# Patient Record
Sex: Male | Born: 1950
Health system: Southern US, Community
[De-identification: ages and names within clinical notes are randomized; demographics above are authoritative.]

## PROBLEM LIST (undated history)

## (undated) DIAGNOSIS — M1990 Unspecified osteoarthritis, unspecified site: Secondary | ICD-10-CM

## (undated) DIAGNOSIS — K219 Gastro-esophageal reflux disease without esophagitis: Secondary | ICD-10-CM

## (undated) DIAGNOSIS — I519 Heart disease, unspecified: Secondary | ICD-10-CM

## (undated) DIAGNOSIS — Z8673 Personal history of transient ischemic attack (TIA), and cerebral infarction without residual deficits: Secondary | ICD-10-CM

## (undated) DIAGNOSIS — K589 Irritable bowel syndrome without diarrhea: Secondary | ICD-10-CM

## (undated) DIAGNOSIS — E669 Obesity, unspecified: Secondary | ICD-10-CM

## (undated) DIAGNOSIS — I379 Nonrheumatic pulmonary valve disorder, unspecified: Secondary | ICD-10-CM

## (undated) DIAGNOSIS — I10 Essential (primary) hypertension: Secondary | ICD-10-CM

## (undated) DIAGNOSIS — E039 Hypothyroidism, unspecified: Secondary | ICD-10-CM

## (undated) DIAGNOSIS — E785 Hyperlipidemia, unspecified: Secondary | ICD-10-CM

## (undated) DIAGNOSIS — I059 Rheumatic mitral valve disease, unspecified: Secondary | ICD-10-CM

## (undated) DIAGNOSIS — I639 Cerebral infarction, unspecified: Secondary | ICD-10-CM

## (undated) DIAGNOSIS — I219 Acute myocardial infarction, unspecified: Secondary | ICD-10-CM

## (undated) HISTORY — DX: Essential (primary) hypertension: I10

## (undated) HISTORY — PX: HERNIA REPAIR: SHX51

## (undated) HISTORY — DX: Rheumatic mitral valve disease, unspecified: I05.9

## (undated) HISTORY — DX: Irritable bowel syndrome, unspecified: K58.9

## (undated) HISTORY — DX: Obesity, unspecified: E66.9

## (undated) HISTORY — DX: Hyperlipidemia, unspecified: E78.5

## (undated) HISTORY — DX: Irritable bowel syndrome without diarrhea: K58.9

## (undated) HISTORY — DX: Hypothyroidism, unspecified: E03.9

## (undated) HISTORY — DX: Nonrheumatic pulmonary valve disorder, unspecified: I37.9

## (undated) HISTORY — DX: Unspecified osteoarthritis, unspecified site: M19.90

## (undated) HISTORY — DX: Gastro-esophageal reflux disease without esophagitis: K21.9

## (undated) HISTORY — DX: Acute myocardial infarction, unspecified: I21.9

## (undated) HISTORY — DX: Heart disease, unspecified: I51.9

---

## 1994-09-24 DIAGNOSIS — I219 Acute myocardial infarction, unspecified: Secondary | ICD-10-CM

## 1994-09-24 HISTORY — DX: Acute myocardial infarction, unspecified: I21.9

## 1994-12-10 HISTORY — PX: CARDIAC CATHETERIZATION: SHX172

## 2011-01-03 ENCOUNTER — Encounter: Payer: Self-pay | Admitting: Cardiology

## 2011-01-09 ENCOUNTER — Encounter: Payer: Self-pay | Admitting: Cardiology

## 2011-01-29 ENCOUNTER — Encounter: Payer: Self-pay | Admitting: Cardiology

## 2011-01-29 DIAGNOSIS — I059 Rheumatic mitral valve disease, unspecified: Secondary | ICD-10-CM

## 2011-01-29 DIAGNOSIS — R079 Chest pain, unspecified: Secondary | ICD-10-CM

## 2011-02-20 NOTE — Letter (Signed)
Summary: External Correspondence/ OFFICE VISIT Adam Shelton  External Correspondence/ OFFICE VISIT Adam Shelton   Imported By: Dorise Hiss 02/13/2011 08:46:17  _____________________________________________________________________  External Attachment:    Type:   Image     Comment:   External Document

## 2011-03-14 ENCOUNTER — Encounter: Payer: Self-pay | Admitting: *Deleted

## 2011-03-20 ENCOUNTER — Ambulatory Visit: Payer: PRIVATE HEALTH INSURANCE | Admitting: Cardiology

## 2011-04-02 ENCOUNTER — Encounter: Payer: Self-pay | Admitting: Cardiology

## 2011-04-02 ENCOUNTER — Encounter: Payer: Self-pay | Admitting: *Deleted

## 2011-04-02 ENCOUNTER — Ambulatory Visit (INDEPENDENT_AMBULATORY_CARE_PROVIDER_SITE_OTHER): Payer: PRIVATE HEALTH INSURANCE | Admitting: Cardiology

## 2011-04-02 VITALS — BP 132/85 | HR 73 | Ht 64.0 in | Wt 163.0 lb

## 2011-04-02 DIAGNOSIS — Z9861 Coronary angioplasty status: Secondary | ICD-10-CM

## 2011-04-02 DIAGNOSIS — E785 Hyperlipidemia, unspecified: Secondary | ICD-10-CM

## 2011-04-02 DIAGNOSIS — I10 Essential (primary) hypertension: Secondary | ICD-10-CM

## 2011-04-02 DIAGNOSIS — E039 Hypothyroidism, unspecified: Secondary | ICD-10-CM

## 2011-04-02 DIAGNOSIS — R9430 Abnormal result of cardiovascular function study, unspecified: Secondary | ICD-10-CM

## 2011-04-02 DIAGNOSIS — I2510 Atherosclerotic heart disease of native coronary artery without angina pectoris: Secondary | ICD-10-CM

## 2011-04-02 MED ORDER — SPIRONOLACTONE 25 MG PO TABS: 25.0000 mg | ORAL_TABLET | Freq: Every day | ORAL | Status: DC

## 2011-04-02 NOTE — Progress Notes (Signed)
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 There is suffered a myocardial infarction in 1995 and received streptokinase. This was followed by angioplasty and stenting in 1995. In 1996 he had a repeat cardiac catheterization was found to have an occluded stent with good collaterals. The patient is a 60 year old male referred with an abnormal Cardiolite stress study. The study showed that the patient has severe LV dysfunction with an ejection fraction of 33%. There is a large area of scar with peri-infarct ischemia in the anteroseptal wall extending from base to apex. There was some increased uptake at rest images but the most part this represents an area of infarction consistent with Q waves present on electrocardiogram extending from V1 to V3. There was also ST segment depression during exercise testing consistent with ischemia. A prior echocardiogram showed an ejection fraction of 40-45% multiple segmental wall motion abnormalities consistent with ischemic cardiomyopathy. Of note is that the patient did exercise during his stress test and was able to achieve 7 metabolic equivalents. He achieved 92% of maximum predicted heart rate no chest pain shortness of breath or arrhythmias. The patient is now referred for further evaluation of the abnormal studies ordered by his physician. He has a history of hypertension, obesity dyslipidemia and hypothyroidism.  No Known Allergies  No current outpatient prescriptions on file prior to visit.    Past Medical History  Diagnosis Date  . Hypertension   . Obesity   . Dyslipidemia   . Hypothyroidism   . Pulmonary valve disorders   . Mitral valve disorders   . Heart disease, unspecified   . Heart attack 09/24/94  . Arthritis     Past Surgical History  Procedure Date  . Hernia repair     age 51  . Cardiac catheterization 12/10/94    Family History  Problem Relation Age of Onset  . Kidney disease Father     died age  79  . Heart disease Mother     had angioplasty    History   Social History  . Marital Status: Married    Spouse Name: N/A    Number of Children: N/A  . Years of Education: N/A   Occupational History  . Not on file.   Social History Main Topics  . Smoking status: Never Smoker   . Smokeless tobacco: Not on file  . Alcohol Use: No  . Drug Use: No  . Sexually Active: Not on file   Other Topics Concern  . Not on file   Social History Narrative   RetiredHas 3 childrenMarried to [employer] exercise--yes 5 days/week   Review of systems:Pertinent positives as outlined above. The remainder of the 18  point review of systems is negative   PHYSICAL EXAM BP 132/85  Pulse 73  Ht 5\' 4"  (1.626 m)  Wt 163 lb (73.936 kg)  BMI 27.98 kg/m2  General: Well-developed, well-nourished in no distress Head: Normocephalic and atraumatic Eyes:PERRLA/EOMI intact, conjunctiva and lids normal Ears: No deformity or lesions Mouth:normal dentition, normal posterior pharynx Neck: Supple, no JVD.  No masses, thyromegaly or abnormal cervical nodes Lungs: Normal breath sounds bilaterally without wheezing.  Normal percussion Cardiac: regular rate and rhythm with normal S1 and S2, no S3 or S4.  PMI is normal.  No pathological murmurs Abdomen: Normal bowel sounds, abdomen is soft and nontender without masses, organomegaly or hernias noted.  No hepatosplenomegaly MSK: Back normal, normal gait muscle strength and tone normal Vascular: Pulse is normal  in all 4 extremities Extremities: No peripheral pitting edema Neurologic: Alert and oriented x 3 Skin: Intact without lesions or rashes Lymphatics: No significant adenopathy Psychologic: Normal affect  ECG:  ASSESSMENT AND PLAN

## 2011-04-02 NOTE — Assessment & Plan Note (Addendum)
Abnormal nuclear perfusion study call: The patient has known ischemic heart disease with a prior large anterior wall myocardial infarction with collaterals likely to the LAD. Given the fact that he has good exercise tolerance, no chest pain and scar on his nuclear perfusion study he continues on medical meds medical therapy. There is some discrepancy in the ejection fraction by nuclear perfusion study is 33% by echocardiogram is 40-45%. However based on these numbers I decided to start the patient on spironolactone at 25 mg. Daily we will follow this up with an electrolyte panel in one week.  The patient has had no symptoms of congestive heart failure. He has not required any hospitalizations. However you will need a followup echocardiogram in one year to make sure that he does not have any further remodeling and further decrease in ejection fraction at which point this would warrant an evaluation for an ICD. At the present time his optimal medical therapy.

## 2011-04-02 NOTE — Patient Instructions (Signed)
1.  Begin Spironolactone 25mg  daily 2.  Your physician recommends that you go to the [facility] for lab work in 7-10 days - BMET 3.  Your physician wants you to follow up in: 6 months.  You will receive a reminder letter in the mail one-two months in advance.  If you don't receive a letter, please call our office to schedule the follow up appointment  4.  If the results of your test are normal or stable, you will receive a letter.  If they are abnormal, the nurse will contact you by phone.

## 2011-11-12 ENCOUNTER — Ambulatory Visit (INDEPENDENT_AMBULATORY_CARE_PROVIDER_SITE_OTHER): Payer: PRIVATE HEALTH INSURANCE | Admitting: Cardiology

## 2011-11-12 ENCOUNTER — Encounter: Payer: Self-pay | Admitting: Cardiology

## 2011-11-12 VITALS — BP 135/88 | HR 65 | Ht 64.0 in | Wt 167.0 lb

## 2011-11-12 DIAGNOSIS — E039 Hypothyroidism, unspecified: Secondary | ICD-10-CM

## 2011-11-12 DIAGNOSIS — I519 Heart disease, unspecified: Secondary | ICD-10-CM

## 2011-11-12 DIAGNOSIS — I2510 Atherosclerotic heart disease of native coronary artery without angina pectoris: Secondary | ICD-10-CM

## 2011-11-12 DIAGNOSIS — I10 Essential (primary) hypertension: Secondary | ICD-10-CM

## 2011-11-12 DIAGNOSIS — E785 Hyperlipidemia, unspecified: Secondary | ICD-10-CM

## 2011-11-12 MED ORDER — LISINOPRIL 10 MG PO TABS: 10.0000 mg | ORAL_TABLET | Freq: Every day | ORAL | Status: DC

## 2011-11-12 NOTE — Progress Notes (Signed)
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 Reportedly had an anterior wall myocardial infarction and was treated with streptokinase but eventually an occluded LAD but does not have good collaterals. This is all per the patient's report. We did perform a nuclear perfusion study which showed an anteroseptal defect extending from the base of the apex. Ejection fraction was 33%. There was a large area of scar but no definite ischemia. By echocardiogram however the ejection fraction is 40-45%. He was continued on medical treatment. The patient's symptoms are stable. He states that he has quite a bit of anxiety.  He feels that anxiety is making him fatigued and unable to work.. Reportedly was started on disability after he came from [place]. He then worked in [employer] for several years until 2009 but could not continue in his nitrite applied for disability. The patient shortness of breath on exertion and a stable angina pattern. However he states when he is relaxed is asymptomatic. He denies any palpitations orthopnea PND.   PMH: reviewed and listed in Problem List in Electronic Records (and see below)  Allergies/SH/FHX : available in Electronic Records for review  Medications: Current Outpatient Prescriptions  Medication Sig Dispense Refill  . aspirin 81 MG EC tablet Take 81 mg by mouth daily.        Marland Kitchen atenolol (TENORMIN) 50 MG tablet Take 50 mg by mouth daily.        Marland Kitchen atorvastatin (LIPITOR) 20 MG tablet Take 10 mg by mouth daily.        . famotidine (PEPCID) 20 MG tablet Take 20 mg by mouth as needed.        . folic acid (PX FOLIC ACID) 956 MCG tablet Take 400 mcg by mouth daily.        . niacin 500 MG tablet Take 500 mg by mouth 3 (three) times a week.        . spironolactone (ALDACTONE) 25 MG tablet Take 1 tablet (25 mg total) by mouth daily.  30 tablet  6  . vitamin E 400 UNIT capsule Take 400 Units  by mouth daily.        Marland Kitchen lisinopril (PRINIVIL,ZESTRIL) 10 MG tablet Take 1 tablet (10 mg total) by mouth daily.  30 tablet  6    ROS: No nausea or vomiting. No fever or chills.No melena or hematochezia.No bleeding.No claudication  Physical Exam: BP 135/88  Pulse 65  Ht 5\' 4"  (1.626 m)  Wt 167 lb (75.751 kg)  BMI 28.67 kg/m2 General: Well-nourished white male in no distress Neck: Normal carotid upstroke no carotid bruits. No thyromegaly. JVP is 6 cm Lungs: Clear breath sounds bilaterally. Cardiac: Regular rate and rhythm with normal S1-S2. Vascular: No edema. Normal peripheral pulses Skin: Warm and dry  12lead ECG: Not performed Limited bedside ECHO:N/A   Assessment and Plan

## 2011-11-12 NOTE — Patient Instructions (Signed)
Your physician wants you to follow-up in: 6 months. You will receive a reminder letter in the mail one-two months in advance. If you don't receive a letter, please call our office to schedule the follow-up appointment. Stop Aceon. Start Lisinopril 10 mg daily.

## 2011-11-12 NOTE — Assessment & Plan Note (Signed)
Blood pressure well controlled continue current medical therapy

## 2011-11-12 NOTE — Assessment & Plan Note (Signed)
Synthroid recently adjusted.

## 2011-11-12 NOTE — Assessment & Plan Note (Signed)
Status post prior anterior wall myocardial infarction. NYHA class IIb symptoms. We will change perindopril to lisinopril 10 mg by mouth daily. I suggested to the patient to consider cardiac catheterization but he states he cannot do this at the present time and is trying to obtain disability.

## 2011-11-12 NOTE — Assessment & Plan Note (Signed)
LDL is 95 and statin drug therapy. Patient is questioning whether he should take niacin for an HDL of 37. I told him that recent studies have shown that there is no benefit in this strategy.

## 2011-11-12 NOTE — Assessment & Plan Note (Signed)
Followup echocardiogram in 6 months.

## 2011-11-26 ENCOUNTER — Telehealth: Payer: Self-pay | Admitting: *Deleted

## 2011-11-26 NOTE — Telephone Encounter (Signed)
Patient came by office yesterday morning.  Wanted to check on status of favorable letter for disability as promised on 11/12/11.   Please advise.

## 2011-11-26 NOTE — Telephone Encounter (Signed)
He will have to wait and not even sure that he meets disability criteria.

## 2011-12-03 NOTE — Telephone Encounter (Signed)
Patient notified of below.  Will give him copy of recent office dictation from Dr. Andee Lineman.  Patient has pending appointment with MD that Social Security office has scheduled for him on 12/13.  He verbalized understanding.

## 2012-05-23 ENCOUNTER — Other Ambulatory Visit: Payer: Self-pay | Admitting: Cardiology

## 2015-08-14 ENCOUNTER — Emergency Department (HOSPITAL_COMMUNITY): Payer: BLUE CROSS/BLUE SHIELD

## 2015-08-14 ENCOUNTER — Encounter (HOSPITAL_COMMUNITY): Payer: Self-pay | Admitting: Family Medicine

## 2015-08-14 ENCOUNTER — Other Ambulatory Visit: Payer: Self-pay | Admitting: *Deleted

## 2015-08-14 ENCOUNTER — Inpatient Hospital Stay (HOSPITAL_COMMUNITY)
Admission: EM | Admit: 2015-08-14 | Discharge: 2015-08-23 | DRG: 233 | Disposition: A | Discharge status: Home / Self Care | Payer: BLUE CROSS/BLUE SHIELD | Attending: Thoracic Surgery (Cardiothoracic Vascular Surgery) | Admitting: Thoracic Surgery (Cardiothoracic Vascular Surgery)

## 2015-08-14 ENCOUNTER — Encounter (HOSPITAL_COMMUNITY)
Admission: EM | Disposition: A | Discharge status: Home / Self Care | Payer: Self-pay | Source: Home / Self Care | Attending: Thoracic Surgery (Cardiothoracic Vascular Surgery)

## 2015-08-14 DIAGNOSIS — E785 Hyperlipidemia, unspecified: Secondary | ICD-10-CM | POA: Diagnosis present

## 2015-08-14 DIAGNOSIS — R079 Chest pain, unspecified: Secondary | ICD-10-CM | POA: Diagnosis not present

## 2015-08-14 DIAGNOSIS — Z8673 Personal history of transient ischemic attack (TIA), and cerebral infarction without residual deficits: Secondary | ICD-10-CM

## 2015-08-14 DIAGNOSIS — I2582 Chronic total occlusion of coronary artery: Secondary | ICD-10-CM | POA: Diagnosis present

## 2015-08-14 DIAGNOSIS — Z951 Presence of aortocoronary bypass graft: Secondary | ICD-10-CM

## 2015-08-14 DIAGNOSIS — E1122 Type 2 diabetes mellitus with diabetic chronic kidney disease: Secondary | ICD-10-CM | POA: Diagnosis present

## 2015-08-14 DIAGNOSIS — I214 Non-ST elevation (NSTEMI) myocardial infarction: Principal | ICD-10-CM | POA: Diagnosis present

## 2015-08-14 DIAGNOSIS — D62 Acute posthemorrhagic anemia: Secondary | ICD-10-CM | POA: Diagnosis not present

## 2015-08-14 DIAGNOSIS — R0602 Shortness of breath: Secondary | ICD-10-CM

## 2015-08-14 DIAGNOSIS — D696 Thrombocytopenia, unspecified: Secondary | ICD-10-CM | POA: Diagnosis not present

## 2015-08-14 DIAGNOSIS — Z7982 Long term (current) use of aspirin: Secondary | ICD-10-CM | POA: Diagnosis not present

## 2015-08-14 DIAGNOSIS — F419 Anxiety disorder, unspecified: Secondary | ICD-10-CM | POA: Diagnosis not present

## 2015-08-14 DIAGNOSIS — I5041 Acute combined systolic (congestive) and diastolic (congestive) heart failure: Secondary | ICD-10-CM | POA: Diagnosis present

## 2015-08-14 DIAGNOSIS — Z01810 Encounter for preprocedural cardiovascular examination: Secondary | ICD-10-CM | POA: Diagnosis not present

## 2015-08-14 DIAGNOSIS — I25110 Atherosclerotic heart disease of native coronary artery with unstable angina pectoris: Secondary | ICD-10-CM | POA: Diagnosis present

## 2015-08-14 DIAGNOSIS — I2510 Atherosclerotic heart disease of native coronary artery without angina pectoris: Secondary | ICD-10-CM

## 2015-08-14 DIAGNOSIS — N189 Chronic kidney disease, unspecified: Secondary | ICD-10-CM | POA: Diagnosis present

## 2015-08-14 DIAGNOSIS — I25111 Atherosclerotic heart disease of native coronary artery with angina pectoris with documented spasm: Secondary | ICD-10-CM | POA: Diagnosis not present

## 2015-08-14 DIAGNOSIS — E039 Hypothyroidism, unspecified: Secondary | ICD-10-CM | POA: Diagnosis present

## 2015-08-14 DIAGNOSIS — Z79899 Other long term (current) drug therapy: Secondary | ICD-10-CM

## 2015-08-14 DIAGNOSIS — I10 Essential (primary) hypertension: Secondary | ICD-10-CM

## 2015-08-14 DIAGNOSIS — I252 Old myocardial infarction: Secondary | ICD-10-CM

## 2015-08-14 DIAGNOSIS — I129 Hypertensive chronic kidney disease with stage 1 through stage 4 chronic kidney disease, or unspecified chronic kidney disease: Secondary | ICD-10-CM | POA: Diagnosis present

## 2015-08-14 DIAGNOSIS — I519 Heart disease, unspecified: Secondary | ICD-10-CM | POA: Diagnosis present

## 2015-08-14 DIAGNOSIS — I255 Ischemic cardiomyopathy: Secondary | ICD-10-CM

## 2015-08-14 HISTORY — DX: Cerebral infarction, unspecified: I63.9

## 2015-08-14 HISTORY — DX: Personal history of transient ischemic attack (TIA), and cerebral infarction without residual deficits: Z86.73

## 2015-08-14 HISTORY — PX: CARDIAC CATHETERIZATION: SHX172

## 2015-08-14 LAB — TROPONIN I
TROPONIN I: 0.53 ng/mL — AB (ref <0.031)
Troponin I: 0.57 ng/mL (ref <0.031)
Troponin I: 0.49 ng/mL — ABNORMAL HIGH (ref <0.031)

## 2015-08-14 LAB — BASIC METABOLIC PANEL
ANION GAP: 8 (ref 5–15)
BUN: 10 mg/dL (ref 6–20)
CO2: 24 mmol/L (ref 22–32)
Calcium: 9.5 mg/dL (ref 8.9–10.3)
Chloride: 102 mmol/L (ref 101–111)
Creatinine, Ser: 0.95 mg/dL (ref 0.61–1.24)
GFR calc Af Amer: >60 mL/min (ref >60)
GFR calc non Af Amer: >60 mL/min (ref >60)
GLUCOSE: 118 mg/dL — AB (ref 65–99)
POTASSIUM: 4.1 mmol/L (ref 3.5–5.1)
Sodium: 134 mmol/L — ABNORMAL LOW (ref 135–145)

## 2015-08-14 LAB — CBC
HEMATOCRIT: 39.3 % (ref 39.0–52.0)
HEMOGLOBIN: 13.3 g/dL (ref 13.0–17.0)
MCH: 27.3 pg (ref 26.0–34.0)
MCHC: 33.8 g/dL (ref 30.0–36.0)
MCV: 80.7 fL (ref 78.0–100.0)
Platelets: 223 10*3/uL (ref 150–400)
RBC: 4.87 MIL/uL (ref 4.22–5.81)
RDW: 14.3 % (ref 11.5–15.5)
WBC: 7 10*3/uL (ref 4.0–10.5)

## 2015-08-14 LAB — PROTIME-INR
INR: 1.12 (ref 0.00–1.49)
Prothrombin Time: 14.5 seconds (ref 11.6–15.2)

## 2015-08-14 LAB — I-STAT TROPONIN, ED: TROPONIN I, POC: 0.51 ng/mL — AB (ref 0.00–0.08)

## 2015-08-14 LAB — T4, FREE: Free T4: 1.12 ng/dL (ref 0.61–1.12)

## 2015-08-14 LAB — MAGNESIUM: MAGNESIUM: 1.6 mg/dL — AB (ref 1.7–2.4)

## 2015-08-14 LAB — APTT: aPTT: 28 seconds (ref 24–37)

## 2015-08-14 LAB — MRSA PCR SCREENING: MRSA BY PCR: NEGATIVE

## 2015-08-14 LAB — PLATELET INHIBITION P2Y12: PLATELET FUNCTION P2Y12: 146 [PRU] — AB (ref 194–418)

## 2015-08-14 LAB — TSH: TSH: 3.386 u[IU]/mL (ref 0.350–4.500)

## 2015-08-14 SURGERY — LEFT HEART CATH AND CORONARY ANGIOGRAPHY

## 2015-08-14 MED ORDER — ASPIRIN EC 81 MG PO TBEC
81.0000 mg | DELAYED_RELEASE_TABLET | Freq: Every day | ORAL | Status: DC
  Administered 2015-08-15 – 2015-08-17 (×3): 81 mg via ORAL
  Filled 2015-08-14 (×4): qty 1

## 2015-08-14 MED ORDER — HEPARIN BOLUS VIA INFUSION
4000.0000 [IU] | Freq: Once | INTRAVENOUS | Status: AC
  Administered 2015-08-14: 4000 [IU] via INTRAVENOUS
  Filled 2015-08-14: qty 4000

## 2015-08-14 MED ORDER — NITROGLYCERIN 1 MG/10 ML FOR IR/CATH LAB
INTRA_ARTERIAL | Status: AC
  Filled 2015-08-14: qty 10

## 2015-08-14 MED ORDER — LIDOCAINE HCL (PF) 1 % IJ SOLN
INTRAMUSCULAR | Status: DC | PRN
  Administered 2015-08-14: 5 mL via INTRADERMAL

## 2015-08-14 MED ORDER — ISOSORBIDE MONONITRATE ER 30 MG PO TB24
30.0000 mg | ORAL_TABLET | Freq: Every day | ORAL | Status: DC
  Filled 2015-08-14: qty 1

## 2015-08-14 MED ORDER — HEPARIN (PORCINE) IN NACL 100-0.45 UNIT/ML-% IJ SOLN: 12.0000 [IU]/kg/h | INTRAMUSCULAR | Status: DC

## 2015-08-14 MED ORDER — ACETAMINOPHEN 325 MG PO TABS: 650.0000 mg | ORAL_TABLET | ORAL | Status: DC | PRN

## 2015-08-14 MED ORDER — ATORVASTATIN CALCIUM 80 MG PO TABS
80.0000 mg | ORAL_TABLET | Freq: Every day | ORAL | Status: DC
  Filled 2015-08-14: qty 1

## 2015-08-14 MED ORDER — ZOLPIDEM TARTRATE 5 MG PO TABS
5.0000 mg | ORAL_TABLET | Freq: Every evening | ORAL | Status: DC | PRN
  Administered 2015-08-14 – 2015-08-17 (×4): 5 mg via ORAL
  Filled 2015-08-14 (×4): qty 1

## 2015-08-14 MED ORDER — NITROGLYCERIN 1 MG/10 ML FOR IR/CATH LAB
INTRA_ARTERIAL | Status: DC | PRN
  Administered 2015-08-14: 16:00:00

## 2015-08-14 MED ORDER — SODIUM CHLORIDE 0.9 % IV SOLN
INTRAVENOUS | Status: AC
  Administered 2015-08-14: 17:00:00 via INTRAVENOUS

## 2015-08-14 MED ORDER — SODIUM CHLORIDE 0.9 % IJ SOLN: 3.0000 mL | INTRAMUSCULAR | Status: DC | PRN

## 2015-08-14 MED ORDER — VERAPAMIL HCL 2.5 MG/ML IV SOLN
INTRAVENOUS | Status: DC | PRN
  Administered 2015-08-14: 15:00:00 via INTRA_ARTERIAL

## 2015-08-14 MED ORDER — HEPARIN (PORCINE) IN NACL 100-0.45 UNIT/ML-% IJ SOLN
900.0000 [IU]/h | INTRAMUSCULAR | Status: DC
  Administered 2015-08-14: 900 [IU]/h via INTRAVENOUS
  Filled 2015-08-14: qty 250

## 2015-08-14 MED ORDER — NITROGLYCERIN IN D5W 200-5 MCG/ML-% IV SOLN
0.0000 ug/min | INTRAVENOUS | Status: DC
  Administered 2015-08-14: 5 ug/min via INTRAVENOUS
  Administered 2015-08-16: 15 ug/min via INTRAVENOUS
  Filled 2015-08-14 (×3): qty 250

## 2015-08-14 MED ORDER — ASPIRIN 81 MG PO TBEC: 81.0000 mg | DELAYED_RELEASE_TABLET | Freq: Every day | ORAL | Status: DC

## 2015-08-14 MED ORDER — SODIUM CHLORIDE 0.9 % IJ SOLN
3.0000 mL | Freq: Two times a day (BID) | INTRAMUSCULAR | Status: DC
  Administered 2015-08-14 – 2015-08-17 (×4): 3 mL via INTRAVENOUS

## 2015-08-14 MED ORDER — ATORVASTATIN CALCIUM 80 MG PO TABS
80.0000 mg | ORAL_TABLET | Freq: Every day | ORAL | Status: DC
  Administered 2015-08-15 – 2015-08-17 (×3): 80 mg via ORAL
  Filled 2015-08-14 (×5): qty 1

## 2015-08-14 MED ORDER — SODIUM CHLORIDE 0.9 % IV SOLN: 250.0000 mL | INTRAVENOUS | Status: DC | PRN

## 2015-08-14 MED ORDER — SODIUM CHLORIDE 0.9 % IV SOLN
INTRAVENOUS | Status: DC
  Administered 2015-08-18: 12:00:00 via INTRAVENOUS

## 2015-08-14 MED ORDER — ONDANSETRON HCL 4 MG/2ML IJ SOLN: 4.0000 mg | Freq: Four times a day (QID) | INTRAMUSCULAR | Status: DC | PRN

## 2015-08-14 MED ORDER — LEVOTHYROXINE SODIUM 50 MCG PO TABS
50.0000 ug | ORAL_TABLET | Freq: Every day | ORAL | Status: DC
  Administered 2015-08-15: 50 ug via ORAL
  Filled 2015-08-14: qty 1

## 2015-08-14 MED ORDER — MIDAZOLAM HCL 2 MG/2ML IJ SOLN
INTRAMUSCULAR | Status: DC | PRN
  Administered 2015-08-14: 1 mg via INTRAVENOUS

## 2015-08-14 MED ORDER — NITROGLYCERIN 0.4 MG SL SUBL: 0.4000 mg | SUBLINGUAL_TABLET | SUBLINGUAL | Status: DC | PRN

## 2015-08-14 MED ORDER — MORPHINE SULFATE (PF) 2 MG/ML IV SOLN: 2.0000 mg | INTRAVENOUS | Status: DC | PRN

## 2015-08-14 MED ORDER — VITAMIN B-12 500 MCG PO TABS: 500.0000 ug | ORAL_TABLET | Freq: Every day | ORAL | Status: DC

## 2015-08-14 MED ORDER — ATORVASTATIN CALCIUM 80 MG PO TABS
80.0000 mg | ORAL_TABLET | ORAL | Status: DC
  Filled 2015-08-14: qty 1

## 2015-08-14 MED ORDER — FOLIC ACID 1 MG PO TABS
0.5000 mg | ORAL_TABLET | Freq: Every day | ORAL | Status: DC
  Administered 2015-08-15 – 2015-08-23 (×8): 0.5 mg via ORAL
  Filled 2015-08-14 (×9): qty 1

## 2015-08-14 MED ORDER — LIDOCAINE HCL (PF) 1 % IJ SOLN
INTRAMUSCULAR | Status: AC
  Filled 2015-08-14: qty 30

## 2015-08-14 MED ORDER — VERAPAMIL HCL 2.5 MG/ML IV SOLN
INTRAVENOUS | Status: AC
  Filled 2015-08-14: qty 2

## 2015-08-14 MED ORDER — SODIUM CHLORIDE 0.9 % WEIGHT BASED INFUSION: 3.0000 mL/kg/h | INTRAVENOUS | Status: DC

## 2015-08-14 MED ORDER — SODIUM CHLORIDE 0.9 % WEIGHT BASED INFUSION: 1.0000 mL/kg/h | INTRAVENOUS | Status: DC

## 2015-08-14 MED ORDER — HEPARIN SODIUM (PORCINE) 1000 UNIT/ML IJ SOLN
INTRAMUSCULAR | Status: AC
  Filled 2015-08-14: qty 1

## 2015-08-14 MED ORDER — IOHEXOL 350 MG/ML SOLN
INTRAVENOUS | Status: DC | PRN
  Administered 2015-08-14: 65 mL via INTRA_ARTERIAL

## 2015-08-14 MED ORDER — SPIRONOLACTONE 25 MG PO TABS
25.0000 mg | ORAL_TABLET | Freq: Every day | ORAL | Status: DC
  Administered 2015-08-15 – 2015-08-17 (×3): 25 mg via ORAL
  Filled 2015-08-14 (×4): qty 1

## 2015-08-14 MED ORDER — ASPIRIN 81 MG PO CHEW
324.0000 mg | CHEWABLE_TABLET | Freq: Once | ORAL | Status: AC
  Administered 2015-08-14: 324 mg via ORAL
  Filled 2015-08-14: qty 4

## 2015-08-14 MED ORDER — ATENOLOL 25 MG PO TABS: 25.0000 mg | ORAL_TABLET | ORAL | Status: DC

## 2015-08-14 MED ORDER — HEPARIN (PORCINE) IN NACL 100-0.45 UNIT/ML-% IJ SOLN
1000.0000 [IU]/h | INTRAMUSCULAR | Status: DC
  Administered 2015-08-14: 900 [IU]/h via INTRAVENOUS
  Administered 2015-08-16: 1000 [IU]/h via INTRAVENOUS
  Filled 2015-08-14 (×7): qty 250

## 2015-08-14 MED ORDER — ASPIRIN 81 MG PO CHEW: 81.0000 mg | CHEWABLE_TABLET | ORAL | Status: DC

## 2015-08-14 MED ORDER — FOLIC ACID 400 MCG PO TABS: 400.0000 ug | ORAL_TABLET | Freq: Every day | ORAL | Status: DC

## 2015-08-14 MED ORDER — LEVOTHYROXINE SODIUM 75 MCG PO TABS: 75.0000 ug | ORAL_TABLET | Freq: Every day | ORAL | Status: DC

## 2015-08-14 MED ORDER — FENTANYL CITRATE (PF) 100 MCG/2ML IJ SOLN
INTRAMUSCULAR | Status: DC | PRN
  Administered 2015-08-14: 25 ug via INTRAVENOUS

## 2015-08-14 MED ORDER — OXYCODONE-ACETAMINOPHEN 5-325 MG PO TABS: 1.0000 | ORAL_TABLET | ORAL | Status: DC | PRN

## 2015-08-14 MED ORDER — VITAMIN B-12 1000 MCG PO TABS
500.0000 ug | ORAL_TABLET | Freq: Every day | ORAL | Status: DC
  Administered 2015-08-15 – 2015-08-23 (×8): 500 ug via ORAL
  Filled 2015-08-14 (×9): qty 1

## 2015-08-14 MED ORDER — MIDAZOLAM HCL 2 MG/2ML IJ SOLN
INTRAMUSCULAR | Status: AC
  Filled 2015-08-14: qty 4

## 2015-08-14 MED ORDER — FENTANYL CITRATE (PF) 100 MCG/2ML IJ SOLN
INTRAMUSCULAR | Status: AC
  Filled 2015-08-14: qty 4

## 2015-08-14 MED ORDER — HEPARIN (PORCINE) IN NACL 2-0.9 UNIT/ML-% IJ SOLN
INTRAMUSCULAR | Status: AC
  Filled 2015-08-14: qty 1500

## 2015-08-14 MED ORDER — HEPARIN SODIUM (PORCINE) 1000 UNIT/ML IJ SOLN
INTRAMUSCULAR | Status: DC | PRN
  Administered 2015-08-14: 3000 [IU] via INTRAVENOUS

## 2015-08-14 MED ORDER — SODIUM CHLORIDE 0.9 % IJ SOLN: 3.0000 mL | Freq: Two times a day (BID) | INTRAMUSCULAR | Status: DC

## 2015-08-14 MED ORDER — TRANDOLAPRIL 1 MG PO TABS
1.0000 mg | ORAL_TABLET | Freq: Every day | ORAL | Status: DC
  Administered 2015-08-15 – 2015-08-17 (×3): 1 mg via ORAL
  Filled 2015-08-14 (×4): qty 1

## 2015-08-14 SURGICAL SUPPLY — 11 items

## 2015-08-14 NOTE — ED Notes (Signed)
Cards at bedside

## 2015-08-14 NOTE — Interval H&P Note (Signed)
History and Physical Interval Note:  08/14/2015 2:59 PM  Adam Shelton  has presented today for surgery, with the diagnosis of angina  The various methods of treatment have been discussed with the patient and family. After consideration of risks, benefits and other options for treatment, the patient has consented to  Procedure(s): Left Heart Cath and Coronary Angiography (N/A) as a surgical intervention .  The patient's history has been reviewed, patient examined, no change in status, stable for surgery.  I have reviewed the patient's chart and labs.  Questions were answered to the patient's satisfaction.    Cath Lab Visit (complete for each Cath Lab visit)  Clinical Evaluation Leading to the Procedure:   ACS: Yes.    Non-ACS:    Anginal Classification: CCS IV  Anti-ischemic medical therapy: No Therapy  Non-Invasive Test Results: No non-invasive testing performed  Prior CABG: No previous CABG       Sherren Mocha

## 2015-08-14 NOTE — ED Notes (Signed)
Pt here for chest pain that has been going on for 10 to 15 days. sts every morning when he wakes up. sts he has been taking nitro spray with relief. sts the pain radiates into both arms.

## 2015-08-14 NOTE — Progress Notes (Signed)
Utilization Review Completed.Donne Anon T8/15/2016

## 2015-08-14 NOTE — Progress Notes (Signed)
ANTICOAGULATION CONSULT NOTE - Initial Consult  Pharmacy Consult for heparin Indication: chest pain/ACS  No Known Allergies  Patient Measurements:   Heparin Dosing Weight: 70.1kg  Vital Signs: Temp: 98.3 F (36.8 C) (08/15 1105) BP: 110/64 mmHg (08/15 1130) Pulse Rate: 73 (08/15 1130)  Labs:  Recent Labs  08/14/15 1138  HGB 13.3  HCT 39.3  PLT 223  APTT 28  LABPROT 14.5  INR 1.12  CREATININE 0.95  TROPONINI 0.57*    CrCl cannot be calculated (Unknown ideal weight.).   Medical History: Past Medical History  Diagnosis Date  . Hypertension   . Obesity   . Dyslipidemia   . Hypothyroidism   . Pulmonary valve disorders   . Mitral valve disorders   . Heart disease, unspecified   . Heart attack 09/24/94  . Arthritis   . Stroke     Assessment: 77 yom with hx anterior MI, occluded LAD, anteroseptal defect admitted with CP, trop+. Pharmacy consulted to dose heparin for NSTEMI (no anticoag pta).  CBC wnl. No bleed issues per RN. CrCl~67. Cards to see patient.  Goal of Therapy:  Heparin level 0.3-0.7 units/ml Monitor platelets by anticoagulation protocol: Yes   Plan:  Heparin 4000 unit bolus Heparin @900  units/h 6h HL Daily HL/CBC Mon s/sx bleeding  . Elicia Lamp, PharmD Clinical Pharmacist Pager [phone] 08/14/2015 1:04 PM

## 2015-08-14 NOTE — Progress Notes (Addendum)
Expected critical troponin of 0.53. Pt was a Non-STEMI taken to the cath lab earlier today. Will continue to monitor.

## 2015-08-14 NOTE — ED Provider Notes (Signed)
CSN: [id number]     Arrival date & time 08/14/15  1052 History   First MD Initiated Contact with Patient 08/14/15 1055     Chief Complaint  Patient presents with  . Chest Pain     (Consider location/radiation/quality/duration/timing/severity/associated sxs/prior Treatment) HPI Comments: The patient is a 64 year old male, he has a known history of hypertension, recent stroke in June of this year where he was traveling on an airplane, became symptomatic and ended up going to a hospital in [place] where he was given thrombolytic therapy with resolution of his symptoms. He notes that he has a history of coronary disease, he had an initial stent placed in 1995 and since that time has had several evaluations of his coronary arteries, he had a stress test 3 years ago at [facility] which she reports was negative. He had an echocardiogram showing an ejection fraction of 30% which was performed approximately 6 weeks ago during his hospitalization in [place]. At this time the patient states he has had approximately one and a half weeks of chest pain, this is sternal, has some radiation to the arms, is occurring more commonly in the morning and there is radiation to the arms bilaterally, it does get better with nitroglycerin which she uses daily when this happens. He describes recently having his nitroglycerin dose increased to 30 mg. He denies shortness of breath or swelling of the legs  Patient is a 64 y.o. male presenting with chest pain. The history is provided by the patient and a relative.  Chest Pain   Past Medical History  Diagnosis Date  . Hypertension   . Obesity   . Dyslipidemia   . Hypothyroidism   . Pulmonary valve disorders   . Mitral valve disorders   . Heart disease, unspecified   . Heart attack 09/24/94  . Arthritis   . Stroke    Past Surgical History  Procedure Laterality Date  . Hernia repair      age 53  . Cardiac catheterization  12/10/94   Family History  Problem  Relation Age of Onset  . Kidney disease Father     died age 84  . Heart disease Mother     had angioplasty   Social History  Substance Use Topics  . Smoking status: Never Smoker   . Smokeless tobacco: Never Used  . Alcohol Use: No    Review of Systems  Cardiovascular: Positive for chest pain.  All other systems reviewed and are negative.     Allergies  Review of patient's allergies indicates no known allergies.  Home Medications   Prior to Admission medications   Medication Sig Start Date End Date Taking? Authorizing Provider  aspirin 81 MG EC tablet Take 81 mg by mouth daily.     Yes Historical Provider, MD  atenolol (TENORMIN) 25 MG tablet Take 25 mg by mouth every other day.    Yes Historical Provider, MD  atenolol (TENORMIN) 50 MG tablet Take 50 mg by mouth every other day.    Yes Historical Provider, MD  atorvastatin (LIPITOR) 20 MG tablet Take 5 mg by mouth daily.    Yes Historical Provider, MD  clopidogrel (PLAVIX) 75 MG tablet Take 75 mg by mouth daily.   Yes Historical Provider, MD  folic acid (PX FOLIC ACID) 944 MCG tablet Take 400 mcg by mouth daily.     Yes Historical Provider, MD  isosorbide mononitrate (IMDUR) 30 MG 24 hr tablet Take 30 mg by mouth daily.   Yes  Historical Provider, MD  levothyroxine (SYNTHROID, LEVOTHROID) 50 MCG tablet Take 50 mcg by mouth daily before breakfast. Take 1 tablet all days except Sat & Sun   Yes Historical Provider, MD  levothyroxine (SYNTHROID, LEVOTHROID) 75 MCG tablet Take 75 mcg by mouth daily before breakfast. Take 1 tablet only on Sat & Sun   Yes Historical Provider, MD  niacin 500 MG tablet Take 500 mg by mouth 3 (three) times a week.     Yes Historical Provider, MD  perindopril (ACEON) 4 MG tablet Take 2 mg by mouth daily.   Yes Historical Provider, MD  spironolactone (ALDACTONE) 25 MG tablet TAKE ONE TABLET BY MOUTH EVERY DAY 05/23/12  Yes Ezra Sites, MD  vitamin B-12 (CYANOCOBALAMIN) 500 MCG tablet Take 500 mcg by mouth  daily.   Yes Historical Provider, MD  vitamin E 400 UNIT capsule Take 400 Units by mouth daily.     Yes Historical Provider, MD  famotidine (PEPCID) 20 MG tablet Take 20 mg by mouth as needed.      Historical Provider, MD  lisinopril (PRINIVIL,ZESTRIL) 10 MG tablet Take 1 tablet (10 mg total) by mouth daily. Patient not taking: Reported on 08/14/2015 11/12/11 11/11/12  Ezra Sites, MD   BP 110/64 mmHg  Pulse 73  Temp(Src) 98.3 F (36.8 C)  Resp 19  SpO2 97% Physical Exam  Constitutional: He appears well-developed and well-nourished. No distress.  HENT:  Head: Normocephalic and atraumatic.  Mouth/Throat: Oropharynx is clear and moist. No oropharyngeal exudate.  Eyes: Conjunctivae and EOM are normal. Pupils are equal, round, and reactive to light. Right eye exhibits no discharge. Left eye exhibits no discharge. No scleral icterus.  Neck: Normal range of motion. Neck supple. No JVD present. No thyromegaly present.  Cardiovascular: Normal rate, regular rhythm, normal heart sounds and intact distal pulses.  Exam reveals no gallop and no friction rub.   No murmur heard. Pulmonary/Chest: Effort normal and breath sounds normal. No respiratory distress. He has no wheezes. He has no rales.  Abdominal: Soft. Bowel sounds are normal. He exhibits no distension and no mass. There is no tenderness.  Musculoskeletal: Normal range of motion. He exhibits no edema or tenderness.  Lymphadenopathy:    He has no cervical adenopathy.  Neurological: He is alert. Coordination normal.  Skin: Skin is warm and dry. No rash noted. No erythema.  Psychiatric: He has a normal mood and affect. His behavior is normal.  Nursing note and vitals reviewed.   ED Course  Procedures (including critical care time) [place], ED - Abnormal; Notable for the following:    Troponin i, poc 0.51 (*)    All other components within normal limits  CBC  APTT  PROTIME-INR  BASIC METABOLIC  PANEL  TROPONIN I    Imaging Review Dg Chest Portable 1 View  08/14/2015   CLINICAL DATA:  Chest pain and shortness of breath for 2 days.  EXAM: PORTABLE CHEST - 1 VIEW  COMPARISON:  Single view of the chest 07/29/2015. PA and lateral chest 12/30/2013.  FINDINGS: The lungs are clear. Heart size is normal. No pneumothorax or pleural effusion. No focal bony abnormality.  IMPRESSION: Negative chest.   Electronically Signed   By: Inge Rise M.D.   On: 08/14/2015 11:31   I, Aislin Onofre D, personally reviewed and evaluated these images and lab results as part of my medical decision-making.   EKG Interpretation   Date/Time:  Monday August 14 2015  11:05:44 EDT Ventricular Rate:  78 PR Interval:  152 QRS Duration: 89 QT Interval:  435 QTC Calculation: 495 R Axis:   -19 Text Interpretation:  Sinus rhythm Borderline left axis deviation Probable  anteroseptal infarct, recent Baseline wander in lead(s) V2 Left  ventricular hypertrophy q waves anteriorly Abnormal ekg No old tracing to  compare Confirmed by Kourtland Coopman  MD, Sigmund Morera ([id number]) on 08/14/2015 11:10:08 AM      MDM   Final diagnoses:  NSTEMI (non-ST elevated myocardial infarction)    At this time the patient's blood pressure and vital signs are unremarkable, the EKG shows no acute ischemic changes though he does have evidence of prior myocardial infarction with Q waves throughout the anterior leads. It is an abnormal EKG but no acute ST elevations. Labs pending, chest x-ray, will give aspirin, nitroglycerin, anticipate admission, anticipate consultation with cardiology.  The patient has ongoing symptoms, discussed with cardiology, elevated troponin consistent with myocardial infarction.  The patient is critically ill, he will need multiple medications including a heparin drip  CRITICAL CARE Performed by: Johnna Acosta Total critical care time: 35 Critical care time was exclusive of separately billable procedures and treating other  patients. Critical care was necessary to treat or prevent imminent or life-threatening deterioration. Critical care was time spent personally by me on the following activities: development of treatment plan with patient and/or surrogate as well as nursing, discussions with consultants, evaluation of patient's response to treatment, examination of patient, obtaining history from patient or surrogate, ordering and performing treatments and interventions, ordering and review of laboratory studies, ordering and review of radiographic studies, pulse oximetry and re-evaluation of patient's condition.  Meds given in ED:  Medications  nitroGLYCERIN (NITROSTAT) SL tablet 0.4 mg (not administered)  heparin bolus via infusion 4,000 Units (not administered)  heparin ADULT infusion 100 units/mL (25000 units/250 mL) (not administered)  aspirin chewable tablet 324 mg (324 mg Oral Given 08/14/15 1156)       Noemi Chapel, MD 08/14/15 1217

## 2015-08-14 NOTE — Consult Note (Signed)
Reason for Consult:Left main disease Referring Physician: Dr. Velora Mediate  Adam Shelton is an 64 y.o. male.  HPI: 64 yo man with known CAD presents with a cc/o chest pain.  Adam Shelton is a 64 yo man originally from [place]. He had an MI in 1995 treated with streptokinase. He has had a known totally occluded LAD. A nuclear study in 2012 showed anterior scar anteriorly, EF was 33%.  His history is significant for hypertension, hyperlipidemia, CAD, MI, obesity, hypothyroidism and arthritis. He had a CVA on 06/22/15 while flying home from [place]. He was treated with TPA and has minimal residual.  He has a 10 day history of substernal chest pain. These episodes having been occuring daily between 5 and 6 AM. He also has some angina early with exertion but it improves with a brief rest and he is then able to walk. He was started on Imdur, but continued to have pain. He came to the ED and his ECG showed lateral ST depression. His troponin was 0.57.  Today he underwent cardiac catheterization which revealed a totally occluded LAD which is well collateralized by the RCA. He also had a severe left main stenosis and significant disease in the circumflex. His EF was 35%.    Past Medical History  Diagnosis Date  . Hypertension   . Obesity   . Dyslipidemia   . Hypothyroidism   . Pulmonary valve disorders   . Mitral valve disorders   . Heart disease, unspecified   . Heart attack 09/24/94  . Arthritis   . Stroke   . H/O: CVA (cerebrovascular accident), 06/22/15 Rt frontal rec'd TPA 08/14/2015    Past Surgical History  Procedure Laterality Date  . Hernia repair      age 59  . Cardiac catheterization  12/10/94    Family History  Problem Relation Age of Onset  . Kidney disease Father     died age 2  . Heart disease Mother     had angioplasty    Social History:  reports that he has never smoked. He has never used smokeless tobacco. He reports that he does not drink alcohol or use  illicit drugs.  Allergies: No Known Allergies  Medications:  Prior to Admission:  Prescriptions prior to admission  Medication Sig Dispense Refill Last Dose  . aspirin 81 MG EC tablet Take 81 mg by mouth daily.     08/14/2015 at Unknown time  . atenolol (TENORMIN) 25 MG tablet Take 25 mg by mouth every other day.    08/14/2015 at 0630  . atenolol (TENORMIN) 50 MG tablet Take 50 mg by mouth every other day.    08/13/2015 at Unknown time  . atorvastatin (LIPITOR) 20 MG tablet Take 5 mg by mouth daily.    08/14/2015 at Unknown time  . clopidogrel (PLAVIX) 75 MG tablet Take 75 mg by mouth daily.   08/14/2015 at Unknown time  . folic acid (PX FOLIC ACID) 157 MCG tablet Take 400 mcg by mouth daily.     08/14/2015 at Unknown time  . isosorbide mononitrate (IMDUR) 30 MG 24 hr tablet Take 30 mg by mouth daily.   08/14/2015 at Unknown time  . levothyroxine (SYNTHROID, LEVOTHROID) 50 MCG tablet Take 50 mcg by mouth daily before breakfast. Take 1 tablet all days except Sat & Sun   08/14/2015 at Unknown time  . levothyroxine (SYNTHROID, LEVOTHROID) 75 MCG tablet Take 75 mcg by mouth daily before breakfast. Take 1 tablet only on Sat &  Sun   08/12/2015  . niacin 500 MG tablet Take 500 mg by mouth 3 (three) times a week.     08/14/2015 at Unknown time  . perindopril (ACEON) 4 MG tablet Take 2 mg by mouth daily.   08/14/2015 at Unknown time  . spironolactone (ALDACTONE) 25 MG tablet TAKE ONE TABLET BY MOUTH EVERY DAY 30 tablet 1 08/14/2015 at Unknown time  . vitamin B-12 (CYANOCOBALAMIN) 500 MCG tablet Take 500 mcg by mouth daily.   08/14/2015 at Unknown time  . vitamin E 400 UNIT capsule Take 400 Units by mouth daily.     08/14/2015 at Unknown time  . famotidine (PEPCID) 20 MG tablet Take 20 mg by mouth as needed.     Taking  . lisinopril (PRINIVIL,ZESTRIL) 10 MG tablet Take 1 tablet (10 mg total) by mouth daily. (Patient not taking: Reported on 08/14/2015) 30 tablet 6     Results for orders placed or performed during  the hospital encounter of 08/14/15 (from the past 48 hour(s))  I-stat troponin, ED     Status: Abnormal   Collection Time: 08/14/15 11:28 AM  Result Value Ref Range   Troponin i, poc 0.51 (HH) 0.00 - 0.08 ng/mL   Comment NOTIFIED PHYSICIAN    Comment 3            Comment: Due to the release kinetics of cTnI, a negative result within the first hours of the onset of symptoms does not rule out myocardial infarction with certainty. If myocardial infarction is still suspected, repeat the test at appropriate intervals.   Basic metabolic panel     Status: Abnormal   Collection Time: 08/14/15 11:38 AM  Result Value Ref Range   Sodium 134 (L) 135 - 145 mmol/L   Potassium 4.1 3.5 - 5.1 mmol/L   Chloride 102 101 - 111 mmol/L   CO2 24 22 - 32 mmol/L   Glucose, Bld 118 (H) 65 - 99 mg/dL   BUN 10 6 - 20 mg/dL   Creatinine, Ser 0.95 0.61 - 1.24 mg/dL   Calcium 9.5 8.9 - 10.3 mg/dL   GFR calc non Af Amer >60 >60 mL/min   GFR calc Af Amer >60 >60 mL/min    Comment: (NOTE) The eGFR has been calculated using the CKD EPI equation. This calculation has not been validated in all clinical situations. eGFR's persistently <60 mL/min signify possible Chronic Kidney Disease.    Anion gap 8 5 - 15  CBC     Status: None   Collection Time: 08/14/15 11:38 AM  Result Value Ref Range   WBC 7.0 4.0 - 10.5 K/uL   RBC 4.87 4.22 - 5.81 MIL/uL   Hemoglobin 13.3 13.0 - 17.0 g/dL   HCT 39.3 39.0 - 52.0 %   MCV 80.7 78.0 - 100.0 fL   MCH 27.3 26.0 - 34.0 pg   MCHC 33.8 30.0 - 36.0 g/dL   RDW 14.3 11.5 - 15.5 %   Platelets 223 150 - 400 K/uL  APTT     Status: None   Collection Time: 08/14/15 11:38 AM  Result Value Ref Range   aPTT 28 24 - 37 seconds  Protime-INR     Status: None   Collection Time: 08/14/15 11:38 AM  Result Value Ref Range   Prothrombin Time 14.5 11.6 - 15.2 seconds   INR 1.12 0.00 - 1.49  Troponin I     Status: Abnormal   Collection Time: 08/14/15 11:38 AM  Result Value Ref Range  Troponin I 0.57 (HH) <0.031 ng/mL    Comment:        POSSIBLE MYOCARDIAL ISCHEMIA. SERIAL TESTING RECOMMENDED. CRITICAL RESULT CALLED TO, READ BACK BY AND VERIFIED WITH: H.MORRISON,RN 08/14/15 1224 BY BSLADE   MRSA PCR Screening     Status: None   Collection Time: 08/14/15  4:20 PM  Result Value Ref Range   MRSA by PCR NEGATIVE NEGATIVE    Comment:        The GeneXpert MRSA Assay (FDA approved for NASAL specimens only), is one component of a comprehensive MRSA colonization surveillance program. It is not intended to diagnose MRSA infection nor to guide or monitor treatment for MRSA infections.   Platelet inhibition p2y12 (if on daily thienopyridine)  not at [facility]     Status: Abnormal   Collection Time: 08/14/15  5:10 PM  Result Value Ref Range   Platelet Function  P2Y12 146 (L) 194 - 418 PRU    Comment:        The literature has shown a direct correlation of PRU values over 230 with higher risks of thrombotic events.  Lower PRU values are associated with platelet inhibition.   Magnesium     Status: Abnormal   Collection Time: 08/14/15  6:51 PM  Result Value Ref Range   Magnesium 1.6 (L) 1.7 - 2.4 mg/dL  TSH     Status: None   Collection Time: 08/14/15  6:51 PM  Result Value Ref Range   TSH 3.386 0.350 - 4.500 uIU/mL  T4, free     Status: None   Collection Time: 08/14/15  6:51 PM  Result Value Ref Range   Free T4 1.12 0.61 - 1.12 ng/dL  Troponin I     Status: Abnormal   Collection Time: 08/14/15  6:51 PM  Result Value Ref Range   Troponin I 0.49 (H) <0.031 ng/mL    Comment:        PERSISTENTLY INCREASED TROPONIN VALUES IN THE RANGE OF 0.04-0.49 ng/mL CAN BE SEEN IN:       -UNSTABLE ANGINA       -CONGESTIVE HEART FAILURE       -MYOCARDITIS       -CHEST TRAUMA       -ARRYHTHMIAS       -LATE PRESENTING MYOCARDIAL INFARCTION       -COPD   CLINICAL FOLLOW-UP RECOMMENDED.     Dg Chest Portable 1 View  08/14/2015   CLINICAL DATA:  Chest pain and shortness of  breath for 2 days.  EXAM: PORTABLE CHEST - 1 VIEW  COMPARISON:  Single view of the chest 07/29/2015. PA and lateral chest 12/30/2013.  FINDINGS: The lungs are clear. Heart size is normal. No pneumothorax or pleural effusion. No focal bony abnormality.  IMPRESSION: Negative chest.   Electronically Signed   By: Inge Rise M.D.   On: 08/14/2015 11:31    Review of Systems  Constitutional: Positive for malaise/fatigue. Negative for fever and chills.  HENT: Negative for congestion.   Eyes: Negative for blurred vision and double vision.  Respiratory: Positive for shortness of breath. Negative for cough and wheezing.   Cardiovascular: Positive for chest pain. Negative for palpitations, orthopnea, claudication and leg swelling.  Gastrointestinal: Positive for heartburn. Negative for nausea and vomiting.  Genitourinary: Negative for dysuria and frequency.  Musculoskeletal: Positive for joint pain.  Neurological: Positive for focal weakness (left side face). Negative for dizziness, speech change, seizures, loss of consciousness and headaches.  Endo/Heme/Allergies: Bruises/bleeds easily.  All other  systems reviewed and are negative.  Blood pressure 148/84, pulse 69, temperature 97.6 F (36.4 C), temperature source Oral, resp. rate 14, height _0  (1.626 m), weight 150 lb 12.7 oz (68.4 kg), SpO2 100 %. Physical Exam  Vitals reviewed. Constitutional: He is oriented to person, place, and time. He appears well-developed and well-nourished. No distress.  HENT:  Head: Normocephalic and atraumatic.  Mouth/Throat: No oropharyngeal exudate.  Eyes: Conjunctivae and EOM are normal. No scleral icterus.  Neck: Neck supple. No thyromegaly present.  No carotid bruit  Cardiovascular: Normal rate, regular rhythm, normal heart sounds and intact distal pulses.  Exam reveals no gallop and no friction rub.   No murmur heard. Respiratory: Effort normal and breath sounds normal. He has no wheezes. He has no rales.   GI: Soft. He exhibits no distension. There is no tenderness.  Musculoskeletal: Normal range of motion. He exhibits no edema.  Neurological: He is alert and oriented to person, place, and time.  Very mild left facial droop, Motor 5/5 Both UE and LE  Skin: Skin is warm and dry.     CARDIAC CATHETERIZATION Conclusion    1. Prox LAD lesion, 100% stenosed. 2. LM lesion, 99% stenosed. 3. Mid Cx lesion, 90% stenosed. 4. There is moderate to severe left ventricular systolic dysfunction.  CONCLUSIONS: 1. Chronic total occlusion of the proximal LAD with right-to-left collaterals 2. Critical left main stenosis extending into the left circumflex and ramus intermedius 3. Patent RCA supplying collaterals to the LAD 4. Severe mid-circumflex stenosis 5. Moderate segmental LV systolic dysfunction with LVEF 35-40%  Plan:  TCTS evaluation for CABG  Tx pt to CCU  Start IV NTG and resume heparin when radial access site stable  Hold plavix and check P2Y12        Assessment/Plan: 64 yo man with multiple CRF and known CAD presents with an unstable coronary syndrome. He has severe left main disease and a chronically totally occluded LAD. CABG is indicated for survival benefit and relief of symptoms.   The complicating factors in his case are his recent stroke and plavix. He had a stroke in late June, about 7 weeks ago. Normally CABG would be delayed until 3 months post stroke but given the severity of his left main disease that is not an option. He has been on plavix. Ideally would wait 5-7 days off plavix but I do not think that is advisable for him. Will check P2Y12 to determine the level of effect.  I have discussed the general nature of the procedure, the need for general anesthesia, and the incisions to be used with the patient. I informed him of the expected hospital stay, overall recovery and short and long term outcomes. I reviewed the indications, risks, benefits and alternatives.  They understand the risks include, but are not limited to death, stroke, MI, DVT/PE, bleeding, possible need for transfusion, infections, and other organ system dysfunction including respiratory, renal, or GI complications. He accepts the risks and agrees to proceed.  He understands that he is at high risk for a perioperative CVA.  Melrose Nakayama 08/14/2015, 9:57 PM

## 2015-08-14 NOTE — ED Notes (Signed)
MD at bedside. 

## 2015-08-14 NOTE — H&P (Signed)
Adam Shelton is an 64 y.o. male.    Primary Cardiologist: was Dr. 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  Hx of Ant MI, treated with streptokinase but eventually an occluded LAD but does not have good collaterals. This is all per the patient's report in record.  A nuclear perfusion study 12/2010 which showed an anteroseptal defect extending from the base of the apex. Ejection fraction was 33%. There was a large area of scar but no definite ischemia. By echocardiogram 12/2010 however the ejection fraction is 40-45%.    Has not been seen since 2012 by cardiology.  Now presents with 10 day hx chest pain, has seen PCP and placed on imdur. He wakes and develops chest pressure every AM associated with SOB and some nausea.  Sl NTG with relief.  His PCP suggested he come to ER for eval.  EKG SR with lateral ST depression compared to 2012.   Troponin is elevated 0.57.  IV heparin is going, no pain now.    On June 23rd pt was flying to [place] and had CVA with plane making emergently landing in [place].  He had TPA and at discharge plavix was addd to his ASA.  No chest pain then.  Echo done at that time with EF 30%.  His CVA was Rt frontal.  Other studies revealed 50% L prox IC stenosis.    Past Medical History  Diagnosis Date  . Hypertension   . Obesity   . Dyslipidemia   . Hypothyroidism   . Pulmonary valve disorders   . Mitral valve disorders   . Heart disease, unspecified   . Heart attack 09/24/94  . Arthritis   . Stroke   . H/O: CVA (cerebrovascular accident), 06/22/15 Rt frontal rec'd TPA 08/14/2015    Past Surgical History  Procedure Laterality Date  . Hernia repair      age 24  . Cardiac catheterization  12/10/94    Family History  Problem Relation Age of Onset  . Kidney disease Father     died age 58  . Heart disease Mother     had angioplasty   Social History:  reports that he has  never smoked. He has never used smokeless tobacco. He reports that he does not drink alcohol or use illicit drugs.  Allergies: No Known Allergies  OUTPATIENT MEDICATIONS: No current facility-administered medications on file prior to encounter.   Current Outpatient Prescriptions on File Prior to Encounter  Medication Sig Dispense Refill  . aspirin 81 MG EC tablet Take 81 mg by mouth daily.      Marland Kitchen atenolol (TENORMIN) 50 MG tablet Take 50 mg by mouth every other day.     Marland Kitchen atorvastatin (LIPITOR) 20 MG tablet Take 5 mg by mouth daily.     . folic acid (PX FOLIC ACID) 578 MCG tablet Take 400 mcg by mouth daily.      . niacin 500 MG tablet Take 500 mg by mouth 3 (three) times a week.      . spironolactone (ALDACTONE) 25 MG tablet TAKE ONE TABLET BY MOUTH EVERY DAY 30 tablet 1  . vitamin E 400 UNIT capsule Take 400 Units by mouth daily.      . famotidine (PEPCID) 20 MG tablet Take 20 mg by mouth as needed.      Marland Kitchen lisinopril (PRINIVIL,ZESTRIL) 10 MG tablet Take  1 tablet (10 mg total) by mouth daily. (Patient not taking: Reported on 08/14/2015) 30 tablet 6  Plavix 75 mg daily.     Results for orders placed or performed during the hospital encounter of 08/14/15 (from the past 48 hour(s))  I-stat troponin, ED     Status: Abnormal   Collection Time: 08/14/15 11:28 AM  Result Value Ref Range   Troponin i, poc 0.51 (HH) 0.00 - 0.08 ng/mL   Comment NOTIFIED PHYSICIAN    Comment 3            Comment: Due to the release kinetics of cTnI, a negative result within the first hours of the onset of symptoms does not rule out myocardial infarction with certainty. If myocardial infarction is still suspected, repeat the test at appropriate intervals.   Basic metabolic panel     Status: Abnormal   Collection Time: 08/14/15 11:38 AM  Result Value Ref Range   Sodium 134 (L) 135 - 145 mmol/L   Potassium 4.1 3.5 - 5.1 mmol/L   Chloride 102 101 - 111 mmol/L   CO2 24 22 - 32 mmol/L   Glucose, Bld 118 (H) 65  - 99 mg/dL   BUN 10 6 - 20 mg/dL   Creatinine, Ser 0.95 0.61 - 1.24 mg/dL   Calcium 9.5 8.9 - 10.3 mg/dL   GFR calc non Af Amer >60 >60 mL/min   GFR calc Af Amer >60 >60 mL/min    Comment: (NOTE) The eGFR has been calculated using the CKD EPI equation. This calculation has not been validated in all clinical situations. eGFR's persistently <60 mL/min signify possible Chronic Kidney Disease.    Anion gap 8 5 - 15  CBC     Status: None   Collection Time: 08/14/15 11:38 AM  Result Value Ref Range   WBC 7.0 4.0 - 10.5 K/uL   RBC 4.87 4.22 - 5.81 MIL/uL   Hemoglobin 13.3 13.0 - 17.0 g/dL   HCT 39.3 39.0 - 52.0 %   MCV 80.7 78.0 - 100.0 fL   MCH 27.3 26.0 - 34.0 pg   MCHC 33.8 30.0 - 36.0 g/dL   RDW 14.3 11.5 - 15.5 %   Platelets 223 150 - 400 K/uL  APTT     Status: None   Collection Time: 08/14/15 11:38 AM  Result Value Ref Range   aPTT 28 24 - 37 seconds  Protime-INR     Status: None   Collection Time: 08/14/15 11:38 AM  Result Value Ref Range   Prothrombin Time 14.5 11.6 - 15.2 seconds   INR 1.12 0.00 - 1.49  Troponin I     Status: Abnormal   Collection Time: 08/14/15 11:38 AM  Result Value Ref Range   Troponin I 0.57 (HH) <0.031 ng/mL    Comment:        POSSIBLE MYOCARDIAL ISCHEMIA. SERIAL TESTING RECOMMENDED. CRITICAL RESULT CALLED TO, READ BACK BY AND VERIFIED WITH: H.MORRISON,RN 08/14/15 1224 BY BSLADE    Dg Chest Portable 1 View  08/14/2015   CLINICAL DATA:  Chest pain and shortness of breath for 2 days.  EXAM: PORTABLE CHEST - 1 VIEW  COMPARISON:  Single view of the chest 07/29/2015. PA and lateral chest 12/30/2013.  FINDINGS: The lungs are clear. Heart size is normal. No pneumothorax or pleural effusion. No focal bony abnormality.  IMPRESSION: Negative chest.   Electronically Signed   By: Inge Rise M.D.   On: 08/14/2015 11:31    ROS:  General:no colds or  fevers, no weight changes Skin:no rashes or ulcers HEENT:no blurred vision, no congestion CV:see  HPI PUL:see HPI GI:no diarrhea some constipation no melena, no indigestino. GU: no hematuria, no dysuria MS:no joint pain, no claudication Neuro:no syncope, no lightheadedness Endo:no diabetes, no thyroid disease   Blood pressure 125/80, pulse 66, temperature 98.3 F (36.8 C), resp. rate 14, height _0  (1.626 m), weight 154 lb 11.2 oz (70.171 kg), SpO2 100 %. PE: General:Pleasant affect, NAD Skin:Warm and dry, brisk capillary refill HEENT:normocephalic, sclera clear, mucus membranes moist Neck:supple, no JVD, no bruits  Heart:S1S2 RRR without murmur, gallup, rub or click Lungs:clear without rales, rhonchi, or wheezes BBW:NJNG, non tender, + BS, do not palpate liver spleen or masses Ext:no lower ext edema, 2+ pedal pulses, 2+ radial pulses Neuro:alert and oriented X 3E, follows commands, + facial symmetry   Assessment/Plan Principal Problem:   NSTEMI (non-ST elevation myocardial infarction) Active Problems:   Coronary artery disease   Hypertension   Dyslipidemia   Hypothyroidism   LV dysfunction   H/O: CVA (cerebrovascular accident), 06/22/15 Rt frontal rec'd TPA  Admit to stepdown and plan for cardiac cath.  Added IV heparin, on Imdur as outpt.  Serial troponins, not sure if pt has had MI or unstable angina alone.  Cardiac cath would be most informative.  Hx of LAD chronic occlusion, but with collateral circulation.  Will check lipids.  Continue plavix.  Last EF 30% in June.    [place] Practitioner Certified [place] Pager [phone] or after 5pm or weekends call [phone] 08/14/2015, 1:51 PM    Personally seen and examined. Agree with above. 64 year old male of [place] with coronary artery disease, ischemic cardiomyopathy, ejection fraction of 30% with 2 week history of worsening morning anginal symptoms, slight relief with new start isosorbide here with mildly elevated troponin of 0.6 consistent with non-ST elevation myocardial  infarction, mild lateral ST segment depression.  We'll proceed with cardiac catheterization. Risks and benefits discussed including stroke, heart attack, death, renal impairment, bleeding. His sons were present for discussion.  We will place on high-dose atorvastatin Former stroke. Formally diagnosed occluded LAD with "good collaterals ". Apical scar.  Candee Furbish, MD

## 2015-08-14 NOTE — Progress Notes (Signed)
[place] for heparin Indication: chest pain/ACS, awaiting CABG evaluation  No Known Allergies  Patient Measurements: Height: 5\' 4"  (162.6 cm) Weight: 150 lb 12.7 oz (68.4 kg) IBW/kg (Calculated) : 59.2 Heparin Dosing Weight: 70.1kg  Vital Signs: Temp: 97.6 F (36.4 C) (08/15 1615) Temp Source: Oral (08/15 1615) BP: 116/71 mmHg (08/15 1745) Pulse Rate: 58 (08/15 1745)  Labs:  Recent Labs  08/14/15 1138  HGB 13.3  HCT 39.3  PLT 223  APTT 28  LABPROT 14.5  INR 1.12  CREATININE 0.95  TROPONINI 0.57*    Estimated Creatinine Clearance: 67.5 mL/min (by C-G formula based on Cr of 0.95).   Assessment: 55 yom with hx anterior MI, occluded LAD, anteroseptal defect admitted with CP, trop+. He was started on heparin this afternoon and was taken to cath prior to a level being obtained. Is to resume heparin in anticipation for possible CABG. TR band deflated at 1800- to resume heparin 2h after this per consult.  No overt bleeding noted.  Goal of Therapy:  Heparin level 0.3-0.7 units/ml Monitor platelets by anticoagulation protocol: Yes   Plan:  -restart Heparin at rate of 900 units/hr at 2000 tonight  -HL at 0200 -Daily HL/CBC -Mon s/sx bleeding and CVTS evalution  Hetal Proano D. Caraline Deutschman, PharmD, BCPS Clinical Pharmacist Pager: [phone] 08/14/2015 6:32 PM

## 2015-08-15 ENCOUNTER — Inpatient Hospital Stay (HOSPITAL_COMMUNITY): Payer: BLUE CROSS/BLUE SHIELD

## 2015-08-15 ENCOUNTER — Ambulatory Visit: Payer: PRIVATE HEALTH INSURANCE | Admitting: Cardiology

## 2015-08-15 ENCOUNTER — Encounter (HOSPITAL_COMMUNITY): Payer: Self-pay | Admitting: Cardiovascular Disease

## 2015-08-15 DIAGNOSIS — Z01810 Encounter for preprocedural cardiovascular examination: Secondary | ICD-10-CM

## 2015-08-15 DIAGNOSIS — I25110 Atherosclerotic heart disease of native coronary artery with unstable angina pectoris: Secondary | ICD-10-CM

## 2015-08-15 DIAGNOSIS — R079 Chest pain, unspecified: Secondary | ICD-10-CM

## 2015-08-15 LAB — SPIROMETRY WITH GRAPH
FEF 25-75 PRE: 3.5 L/s
FEF 25-75 Post: 2.79 L/sec
FEF2575-%CHANGE-POST: -20 %
FEF2575-%PRED-PRE: 150 %
FEF2575-%Pred-Post: 119 %
FEV1-%Change-Post: -1 %
FEV1-%PRED-POST: 84 %
FEV1-%PRED-PRE: 85 %
FEV1-PRE: 2.4 L
FEV1-Post: 2.37 L
FEV1FVC-%Change-Post: -13 %
FEV1FVC-%Pred-Pre: 125 %
FEV6-%CHANGE-POST: 12 %
FEV6-%PRED-POST: 81 %
FEV6-%PRED-PRE: 72 %
FEV6-POST: 2.88 L
FEV6-PRE: 2.56 L
FEV6FVC-%Change-Post: 0 %
FEV6FVC-%PRED-POST: 104 %
FEV6FVC-%PRED-PRE: 105 %
FVC-%CHANGE-POST: 13 %
FVC-%Pred-Post: 77 %
FVC-%Pred-Pre: 68 %
FVC-Post: 2.9 L
FVC-Pre: 2.56 L
POST FEV6/FVC RATIO: 99 %
PRE FEV1/FVC RATIO: 94 %
Post FEV1/FVC ratio: 82 %
Pre FEV6/FVC Ratio: 100 %

## 2015-08-15 LAB — BASIC METABOLIC PANEL
Anion gap: 9 (ref 5–15)
BUN: 9 mg/dL (ref 6–20)
CO2: 22 mmol/L (ref 22–32)
Calcium: 8.7 mg/dL — ABNORMAL LOW (ref 8.9–10.3)
Chloride: 103 mmol/L (ref 101–111)
Creatinine, Ser: 0.96 mg/dL (ref 0.61–1.24)
Glucose, Bld: 88 mg/dL (ref 65–99)
POTASSIUM: 3.9 mmol/L (ref 3.5–5.1)
SODIUM: 134 mmol/L — AB (ref 135–145)

## 2015-08-15 LAB — LIPID PANEL
CHOL/HDL RATIO: 4.2 ratio
CHOLESTEROL: 133 mg/dL (ref 0–200)
HDL: 32 mg/dL — AB (ref >40)
LDL Cholesterol: 85 mg/dL (ref 0–99)
TRIGLYCERIDES: 80 mg/dL (ref <150)
VLDL: 16 mg/dL (ref 0–40)

## 2015-08-15 LAB — CBC
HCT: 38.9 % — ABNORMAL LOW (ref 39.0–52.0)
Hemoglobin: 13.2 g/dL (ref 13.0–17.0)
MCH: 27.3 pg (ref 26.0–34.0)
MCHC: 33.9 g/dL (ref 30.0–36.0)
MCV: 80.5 fL (ref 78.0–100.0)
PLATELETS: 193 10*3/uL (ref 150–400)
RBC: 4.83 MIL/uL (ref 4.22–5.81)
RDW: 14.3 % (ref 11.5–15.5)
WBC: 7.9 10*3/uL (ref 4.0–10.5)

## 2015-08-15 LAB — HEPARIN LEVEL (UNFRACTIONATED)
HEPARIN UNFRACTIONATED: 0.14 [IU]/mL — AB (ref 0.30–0.70)
HEPARIN UNFRACTIONATED: 0.88 [IU]/mL — AB (ref 0.30–0.70)
HEPARIN UNFRACTIONATED: 0.55 [IU]/mL (ref 0.30–0.70)

## 2015-08-15 LAB — HEMOGLOBIN A1C
Hgb A1c MFr Bld: 5.8 % — ABNORMAL HIGH (ref 4.8–5.6)
MEAN PLASMA GLUCOSE: 120 mg/dL

## 2015-08-15 LAB — TROPONIN I: Troponin I: 0.38 ng/mL — ABNORMAL HIGH (ref <0.031)

## 2015-08-15 MED ORDER — ATENOLOL 25 MG PO TABS
25.0000 mg | ORAL_TABLET | Freq: Every day | ORAL | Status: DC
  Administered 2015-08-15 – 2015-08-16 (×2): 25 mg via ORAL
  Filled 2015-08-15 (×2): qty 1

## 2015-08-15 MED ORDER — LEVOTHYROXINE SODIUM 50 MCG PO TABS
75.0000 ug | ORAL_TABLET | ORAL | Status: DC
Start: 2015-08-19 — End: 2015-08-23
  Administered 2015-08-19 – 2015-08-20 (×2): 75 ug via ORAL
  Filled 2015-08-15 (×2): qty 1

## 2015-08-15 MED ORDER — LEVOTHYROXINE SODIUM 50 MCG PO TABS
50.0000 ug | ORAL_TABLET | ORAL | Status: DC
  Administered 2015-08-16 – 2015-08-23 (×5): 50 ug via ORAL
  Filled 2015-08-15 (×6): qty 1

## 2015-08-15 MED ORDER — ALBUTEROL SULFATE (2.5 MG/3ML) 0.083% IN NEBU
INHALATION_SOLUTION | RESPIRATORY_TRACT | Status: AC
  Administered 2015-08-15: 2.5 mg via RESPIRATORY_TRACT
  Filled 2015-08-15: qty 3

## 2015-08-15 MED ORDER — ALBUTEROL SULFATE (2.5 MG/3ML) 0.083% IN NEBU
2.5000 mg | INHALATION_SOLUTION | Freq: Once | RESPIRATORY_TRACT | Status: AC
  Administered 2015-08-15: 2.5 mg via RESPIRATORY_TRACT

## 2015-08-15 NOTE — Progress Notes (Signed)
ANTICOAGULATION CONSULT NOTE  Pharmacy Consult for heparin Indication: 2 vessel CAD (awaiting CABG)  No Known Allergies  Patient Measurements: Height: 5\' 4"  (162.6 cm) Weight: 150 lb 12.7 oz (68.4 kg) IBW/kg (Calculated) : 59.2 Heparin Dosing Weight: 70.1kg  Vital Signs: Temp: 98.2 F (36.8 C) (08/16 0000) Temp Source: Oral (08/16 0000) BP: 82/52 mmHg (08/16 0000) Pulse Rate: 66 (08/16 0000)  Labs:  Recent Labs  08/14/15 1138 08/14/15 1851 08/14/15 2219 08/15/15 0132  HGB 13.3  --   --   --   HCT 39.3  --   --   --   PLT 223  --   --   --   APTT 28  --   --   --   LABPROT 14.5  --   --   --   INR 1.12  --   --   --   HEPARINUNFRC  --   --   --  0.14*  CREATININE 0.95  --   --   --   TROPONINI 0.57* 0.49* 0.53*  --     Estimated Creatinine Clearance: 67.5 mL/min (by C-G formula based on Cr of 0.95).   Assessment: 8 yom s/p cath which showed severe left main disease and a chronically totally occluded LAD. Heparin restarted post cath for bridge to CABG. CABG date not determined yet. Heparin level 0.14 (subtherapeutic) on 900 units/hr. No issues with line or bleeding per RN.    Goal of Therapy:  Heparin level 0.3-0.7 units/ml Monitor platelets by anticoagulation protocol: Yes   Plan:  -Increase heparin to 1150 units/hr at 2000 tonight  -F/u heparin level in 6 hours  Sherlon Handing, PharmD, BCPS Clinical pharmacist, pager [phone] 08/15/2015 2:55 AM

## 2015-08-15 NOTE — Progress Notes (Signed)
ANTICOAGULATION CONSULT NOTE  Pharmacy Consult for heparin Indication: 2 vessel CAD (awaiting CABG)  No Known Allergies  Patient Measurements: Height: 5\' 4"  (162.6 cm) Weight: 151 lb 7.3 oz (68.7 kg) IBW/kg (Calculated) : 59.2 Heparin Dosing Weight: 70.1kg  Vital Signs: Temp: 97.9 F (36.6 C) (08/16 0400) Temp Source: Oral (08/16 0400) BP: 113/65 mmHg (08/16 1302) Pulse Rate: 77 (08/16 1302)  Labs:  Recent Labs  08/14/15 1138 08/14/15 1851 08/14/15 2219 08/15/15 0132 08/15/15 0441 08/15/15 0900  HGB 13.3  --   --   --  13.2  --   HCT 39.3  --   --   --  38.9*  --   PLT 223  --   --   --  193  --   APTT 28  --   --   --   --   --   LABPROT 14.5  --   --   --   --   --   INR 1.12  --   --   --   --   --   HEPARINUNFRC  --   --   --  0.14*  --  0.88*  CREATININE 0.95  --   --   --  0.96  --   TROPONINI 0.57* 0.49* 0.53*  --  0.38*  --     Estimated Creatinine Clearance: 66.8 mL/min (by C-G formula based on Cr of 0.96).   Assessment: 81 yom s/p cath which showed severe left main disease and a chronically totally occluded LAD. Heparin restarted post cath for bridge to CABG. CABG later this week.  -Heparin level is above goal (HL= 0.88) on 1150 units/hr  Goal of Therapy:  Heparin level 0.3-0.7 units/ml Monitor platelets by anticoagulation protocol: Yes   Plan:  -Decrease heparin to 1000 units/hr -Heparin level in 6 hours and daily wth CBC daily  Hildred Laser, Pharm D 08/15/2015 1:44 PM

## 2015-08-15 NOTE — Progress Notes (Signed)
       Patient Name: Adam Shelton Date of Encounter: 08/15/2015    SUBJECTIVE: Had mild chest discomfort last night.  TELEMETRY:  Normal sinus rhythm Filed Vitals:   08/15/15 0308 08/15/15 0400 08/15/15 0500 08/15/15 0600  BP:  87/66 107/69 112/73  Pulse:  54 71 67  Temp:  97.9 F (36.6 C)    TempSrc:  Oral    Resp:  20 25 25   Height:      Weight: 68.7 kg (151 lb 7.3 oz)     SpO2:  96% 99% 98%    Intake/Output Summary (Last 24 hours) at 08/15/15 1123 Last data filed at 08/15/15 0600  Gross per 24 hour  Intake 351.62 ml  Output    802 ml  Net -450.38 ml   LABS: Basic Metabolic Panel:  Recent Labs  08/14/15 1138 08/14/15 1851 08/15/15 0441  NA 134*  --  134*  K 4.1  --  3.9  CL 102  --  103  CO2 24  --  22  GLUCOSE 118*  --  88  BUN 10  --  9  CREATININE 0.95  --  0.96  CALCIUM 9.5  --  8.7*  MG  --  1.6*  --    CBC:  Recent Labs  08/14/15 1138 08/15/15 0441  WBC 7.0 7.9  HGB 13.3 13.2  HCT 39.3 38.9*  MCV 80.7 80.5  PLT 223 193   Cardiac Enzymes:  Recent Labs  08/14/15 1851 08/14/15 2219 08/15/15 0441  TROPONINI 0.49* 0.53* 0.38*   BNP: Invalid input(s): POCBNP Hemoglobin A1C:  Recent Labs  08/14/15 1851  HGBA1C 5.8*   Fasting Lipid Panel:  Recent Labs  08/15/15 0441  CHOL 133  HDL 32*  LDLCALC 85  TRIG 80  CHOLHDL 4.2   P2Y12: Less than 200  Radiology/Studies:  Reviewed coronary angio. Apical wall motion abnormality and cannot totally exclude the possibility of endothelialized apical thrombus. Overall LV function estimated EF 40-45%  Physical Exam: Blood pressure 112/73, pulse 67, temperature 97.9 F (36.6 C), temperature source Oral, resp. rate 25, height 5\' 4"  (1.626 m), weight 68.7 kg (151 lb 7.3 oz), SpO2 98 %. Weight change:   Wt Readings from Last 3 Encounters:  08/15/15 68.7 kg (151 lb 7.3 oz)  11/12/11 75.751 kg (167 lb)  04/02/11 73.936 kg (163 lb)    S4 gallop is heard. No murmurs heard and no rub is  present. Neck veins are flat Lungs are clear Right radial is patent and without evidence of hematoma Neuro exam is intact  ASSESSMENT:  1. ACS/non-ST elevation myocardial infarction, with recurrent pain last evening. Severe distal left main, chronic occlusion of LAD, high-grade circumflex and diagonal branches. Native right coronary is widely patent with collaterals to LAD. 2. Left ventricular dysfunction and possible endothelialized apical thrombus 3. History of hypertension 4. Prior CVA with TPA June 2016 5. Hyperlipidemia 6. Hypothyroidism  Plan:   Discontinue Imdur and increase IV nitroglycerin as needed to control recurrent chest pain  Surgery later this week.  Continue to follow along with TCTS  Signed, Sinclair Grooms 08/15/2015, 11:23 AM

## 2015-08-15 NOTE — Progress Notes (Signed)
1400-1440 Discussed with pt and son sternal precautions and demonstrated getting up and down without use of arms. Gave IS and showed pt how to use. Can get to 1250 ml. Gave OHS booklet and care guide. Wrote down how to view pre op video. Wife will be available after discharge to stay with pt per son. Pt assured he would get diet instruction when he is discharged. Follows kosher diet. Since pt has LM disease and ed has been done, will follow up after surgery. Graylon Good RN BSN 08/15/2015 2:39 PM

## 2015-08-15 NOTE — Progress Notes (Signed)
  Echocardiogram 2D Echocardiogram has been performed.  Donata Clay 08/15/2015, 1:23 PM

## 2015-08-15 NOTE — Progress Notes (Signed)
D/t religous dietary restrictions, I changed the diet order in the computer to a Regular, with no salt.  Pt only eats KOSHER fish and chicken with vegetables.  The restrictions of the pt's personal diet appear to far outweigh the restriction on a heart healthy diet here.  Spoke with Dr Tamala Julian who was ok with this change to better meet the needs of the pt.   Carol Ada, RN

## 2015-08-15 NOTE — Progress Notes (Signed)
ANTICOAGULATION CONSULT NOTE - Follow Up Consult  Pharmacy Consult for heparin Indication: 2 vessel CAD (awaiting CABG)  No Known Allergies  Patient Measurements: Height: 5\' 4"  (162.6 cm) Weight: 151 lb 7.3 oz (68.7 kg) IBW/kg (Calculated) : 59.2 Heparin Dosing Weight: 70.1 kg  Vital Signs: Temp: 97.4 F (36.3 C) (08/16 2043) Temp Source: Oral (08/16 2043) BP: 126/76 mmHg (08/16 2000) Pulse Rate: 67 (08/16 2000)  Labs:  Recent Labs  08/14/15 1138 08/14/15 1851 08/14/15 2219 08/15/15 0132 08/15/15 0441 08/15/15 0900 08/15/15 2013  HGB 13.3  --   --   --  13.2  --   --   HCT 39.3  --   --   --  38.9*  --   --   PLT 223  --   --   --  193  --   --   APTT 28  --   --   --   --   --   --   LABPROT 14.5  --   --   --   --   --   --   INR 1.12  --   --   --   --   --   --   HEPARINUNFRC  --   --   --  0.14*  --  0.88* 0.55  CREATININE 0.95  --   --   --  0.96  --   --   TROPONINI 0.57* 0.49* 0.53*  --  0.38*  --   --     Estimated Creatinine Clearance: 66.8 mL/min (by C-G formula based on Cr of 0.96).   Medications:  Infusions:  . sodium chloride    . heparin 1,000 Units/hr (08/15/15 1900)  . nitroGLYCERIN 20 mcg/min (08/15/15 1900)    Assessment: 64 y/o male s/p cath with 2 vessel CAD on IV heparin awaiting CABG. Heparin level is therapeutic at 0.55 on 1000 units/hr. No bleeding noted.  Goal of Therapy:  Heparin level 0.3-0.7 units/ml Monitor platelets by anticoagulation protocol: Yes   Plan:  - Continue heparin at 1000 units/hr - Daily heparin level and CBC - Monitor for s/sx of bleeding  [facility], [place].D., BCPS Clinical Pharmacist Pager: [phone] 08/15/2015 8:58 PM

## 2015-08-15 NOTE — Progress Notes (Signed)
VASCULAR LAB PRELIMINARY  PRELIMINARY  PRELIMINARY  PRELIMINARY  Pre-op Cardiac Surgery  Carotid Findings:  Bilateral:  1-39% ICA stenosis.  Vertebral artery flow is antegrade.    Ralene Cork, RVT 08/15/2015 5:19 PM      Upper Extremity Right Left  Brachial Pressures    Radial Waveforms Pending.   Ulnar Waveforms    Palmar Arch (Allen's Test)

## 2015-08-15 NOTE — Progress Notes (Signed)
ANTICOAGULATION CONSULT NOTE  Pharmacy Consult for heparin Indication: 2 vessel CAD (awaiting CABG)  No Known Allergies  Patient Measurements: Height: 5\' 4"  (162.6 cm) Weight: 151 lb 7.3 oz (68.7 kg) IBW/kg (Calculated) : 59.2 Heparin Dosing Weight: 70.1kg  Vital Signs: Temp: 97.4 F (36.3 C) (08/16 2043) Temp Source: Oral (08/16 2043) BP: 126/76 mmHg (08/16 2000) Pulse Rate: 67 (08/16 2000)  Labs:  Recent Labs  08/14/15 1138 08/14/15 1851 08/14/15 2219 08/15/15 0132 08/15/15 0441 08/15/15 0900 08/15/15 2013  HGB 13.3  --   --   --  13.2  --   --   HCT 39.3  --   --   --  38.9*  --   --   PLT 223  --   --   --  193  --   --   APTT 28  --   --   --   --   --   --   LABPROT 14.5  --   --   --   --   --   --   INR 1.12  --   --   --   --   --   --   HEPARINUNFRC  --   --   --  0.14*  --  0.88* 0.55  CREATININE 0.95  --   --   --  0.96  --   --   TROPONINI 0.57* 0.49* 0.53*  --  0.38*  --   --     Estimated Creatinine Clearance: 66.8 mL/min (by C-G formula based on Cr of 0.96).   Assessment: 75 yom s/p cath which showed severe left main disease and a chronically totally occluded LAD. Heparin restarted post cath for bridge to CABG. CABG later this week.  Heparin level now therapeutic  Goal of Therapy:  Heparin level 0.3-0.7 units/ml Monitor platelets by anticoagulation protocol: Yes   Plan:  Heparin at 1000 units / hr Follow up AM labs  Thank you Anette Guarneri, PharmD [phone]  08/15/2015 8:56 PM

## 2015-08-15 NOTE — Progress Notes (Signed)
1 Day Post-Op Procedure(s) (LRB): Left Heart Cath and Coronary Angiography (N/A) Subjective: Transient Cp at 0530 this AM No CP since then  Objective: Vital signs in last 24 hours: Temp:  [97.6 F (36.4 C)-98.2 F (36.8 C)] 97.6 F (36.4 C) (08/16 1600) Pulse Rate:  [54-78] 77 (08/16 1302) Cardiac Rhythm:  [-] Normal sinus rhythm (08/16 1600) Resp:  [12-26] 12 (08/16 1600) BP: (82-154)/(52-89) 154/87 mmHg (08/16 1600) SpO2:  [96 %-100 %] 100 % (08/16 1600) Weight:  [151 lb 7.3 oz (68.7 kg)] 151 lb 7.3 oz (68.7 kg) (08/16 0308)  Hemodynamic parameters for last 24 hours:    Intake/Output from previous day: 08/15 0701 - 08/16 0700 In: 364.6 [I.V.:364.6] Out: 802 [Urine:802] Intake/Output this shift: Total I/O In: 508.9 [P.O.:340; I.V.:168.9] Out: -   unchanged  Lab Results:  Recent Labs  08/14/15 1138 08/15/15 0441  WBC 7.0 7.9  HGB 13.3 13.2  HCT 39.3 38.9*  PLT 223 193   BMET:  Recent Labs  08/14/15 1138 08/15/15 0441  NA 134* 134*  K 4.1 3.9  CL 102 103  CO2 24 22  GLUCOSE 118* 88  BUN 10 9  CREATININE 0.95 0.96  CALCIUM 9.5 8.7*    PT/INR:  Recent Labs  08/14/15 1138  LABPROT 14.5  INR 1.12   ABG No results found for: PHART, HCO3, TCO2, ACIDBASEDEF, O2SAT CBG (last 3)  No results for input(s): GLUCAP in the last 72 hours.  Assessment/Plan: S/P Procedure(s) (LRB): Left Heart Cath and Coronary Angiography (N/A) -  Severe left main and 2 vessel CAD Had been on plavix PTA, P2Y12 c/w high level of platelet inhibition Plan as of now is to proceed with CABG on Friday. Ideally would wait until next week but I think the risk is too high. If he develops unstable chest pain would have to proceed sooner   LOS: 1 day    Melrose Nakayama 08/15/2015

## 2015-08-16 ENCOUNTER — Inpatient Hospital Stay (HOSPITAL_COMMUNITY): Payer: BLUE CROSS/BLUE SHIELD

## 2015-08-16 DIAGNOSIS — I25111 Atherosclerotic heart disease of native coronary artery with angina pectoris with documented spasm: Secondary | ICD-10-CM

## 2015-08-16 DIAGNOSIS — I519 Heart disease, unspecified: Secondary | ICD-10-CM

## 2015-08-16 DIAGNOSIS — E038 Other specified hypothyroidism: Secondary | ICD-10-CM

## 2015-08-16 LAB — CBC
HEMATOCRIT: 41.5 % (ref 39.0–52.0)
HEMOGLOBIN: 14.1 g/dL (ref 13.0–17.0)
MCH: 27.3 pg (ref 26.0–34.0)
MCHC: 34 g/dL (ref 30.0–36.0)
MCV: 80.3 fL (ref 78.0–100.0)
Platelets: 219 10*3/uL (ref 150–400)
RBC: 5.17 MIL/uL (ref 4.22–5.81)
RDW: 14.2 % (ref 11.5–15.5)
WBC: 9.5 10*3/uL (ref 4.0–10.5)

## 2015-08-16 LAB — HEPARIN LEVEL (UNFRACTIONATED): HEPARIN UNFRACTIONATED: 0.63 [IU]/mL (ref 0.30–0.70)

## 2015-08-16 MED ORDER — METOPROLOL SUCCINATE ER 25 MG PO TB24
25.0000 mg | ORAL_TABLET | Freq: Every day | ORAL | Status: DC
  Administered 2015-08-17: 25 mg via ORAL
  Filled 2015-08-16 (×2): qty 1

## 2015-08-16 NOTE — Progress Notes (Signed)
VASCULAR LAB PRELIMINARY  PRELIMINARY  PRELIMINARY  PRELIMINARY  Pre-op Cardiac Surgery  Carotid Findings:  Bilateral:  1-39% ICA stenosis.  Vertebral artery flow is antegrade.    Ralene Cork, RVT 08/16/2015 11:21 AM      Upper Extremity Right Left  Brachial Pressures 113-Triphasic 123-Triphasic  Radial Waveforms Triphasic Triphasic  Ulnar Waveforms Triphasic Triphsic  Palmar Arch (Allen's Test) Signal decreases >/=50% with radial compression, decreases <50% with ulnar compression. Within normal limits.    08/16/2015 11:22 AM Maudry Mayhew, RVT, RDCS, RDMS

## 2015-08-16 NOTE — Progress Notes (Signed)
       Patient Name: Adam BEEGLE Date of Encounter: 08/16/2015    SUBJECTIVE: Much improved last 24 hours. No significant episodes of chest pain.  TELEMETRY:  No malignant arrhythmias. Filed Vitals:   08/16/15 0700 08/16/15 0800 08/16/15 0900 08/16/15 1055  BP: 91/54 138/74 116/70 120/86  Pulse: 74 80 81 74  Temp:  97.4 F (36.3 C)    TempSrc:  Oral    Resp: 23 20 25    Height:      Weight:      SpO2: 96% 99% 97%     Intake/Output Summary (Last 24 hours) at 08/16/15 1103 Last data filed at 08/16/15 0900  Gross per 24 hour  Intake 935.43 ml  Output      0 ml  Net 935.43 ml   LABS: Basic Metabolic Panel:  Recent Labs  08/14/15 1138 08/14/15 1851 08/15/15 0441  NA 134*  --  134*  K 4.1  --  3.9  CL 102  --  103  CO2 24  --  22  GLUCOSE 118*  --  88  BUN 10  --  9  CREATININE 0.95  --  0.96  CALCIUM 9.5  --  8.7*  MG  --  1.6*  --    CBC:  Recent Labs  08/15/15 0441 08/16/15 0250  WBC 7.9 9.5  HGB 13.2 14.1  HCT 38.9* 41.5  MCV 80.5 80.3  PLT 193 219   Cardiac Enzymes:  Recent Labs  08/14/15 1851 08/14/15 2219 08/15/15 0441  TROPONINI 0.49* 0.53* 0.38*   BNP: Invalid input(s): POCBNP Hemoglobin A1C:  Recent Labs  08/14/15 1851  HGBA1C 5.8*   Fasting Lipid Panel:  Recent Labs  08/15/15 0441  CHOL 133  HDL 32*  LDLCALC 85  TRIG 80  CHOLHDL 4.2    Radiology/Studies:  No new data  Physical Exam: Blood pressure 120/86, pulse 74, temperature 97.4 F (36.3 C), temperature source Oral, resp. rate 25, height 5\' 4"  (1.626 m), weight 68.4 kg (150 lb 12.7 oz), SpO2 97 %. Weight change: -1.7 kg (-3 lb 12 oz)  Wt Readings from Last 3 Encounters:  08/16/15 68.4 kg (150 lb 12.7 oz)  11/12/11 75.751 kg (167 lb)  04/02/11 73.936 kg (163 lb)    Chest is clear.  Cardiac exam no rub or murmur. S4 gallop audible Extremities reveal no edema. Neuro status is stable   ASSESSMENT:  1. Non-ST elevation myocardial infarction/acute  coronary syndrome secondary to high-grade distal left main, known chronic total occlusion of proximal LAD, high-grade ostial circumflex. LAD collateralized from the right coronary  2. Mildly depressed LV systolic function, EF 54-00%. No evidence of volume overload or acute combined systolic diastolic heart failure on current medical regimen  3. Hypothyroidism currently on replacement therapy 4. Hypertension under excellent control 5. Hyperlipidemia being treated 6. Recent CVA receiving TPA without neurological symptoms this for   Plan:  1. Continue IV heparin and IV nitroglycerin  2. Optimize beta blocker therapy. We will switch from atenolol to metoprolol  3. Await CABG which I believe is now scheduled for Friday.  4. Patient should stay in the coronary care unit until surgery   Signed, Sinclair Grooms 08/16/2015, 11:03 AM

## 2015-08-16 NOTE — Progress Notes (Signed)
ANTICOAGULATION CONSULT NOTE - Follow Up Consult  Pharmacy Consult for heparin Indication: 2 vessel CAD (awaiting CABG)  No Known Allergies  Patient Measurements: Height: 5\' 4"  (162.6 cm) Weight: 150 lb 12.7 oz (68.4 kg) IBW/kg (Calculated) : 59.2 Heparin Dosing Weight: 70.1 kg  Vital Signs: Temp: 97.4 F (36.3 C) (08/17 0800) Temp Source: Oral (08/17 0800) BP: 138/74 mmHg (08/17 0800) Pulse Rate: 80 (08/17 0800)  Labs:  Recent Labs  08/14/15 1138 08/14/15 1851 08/14/15 2219  08/15/15 0441 08/15/15 0900 08/15/15 2013 08/16/15 0250 08/16/15 0307  HGB 13.3  --   --   --  13.2  --   --  14.1  --   HCT 39.3  --   --   --  38.9*  --   --  41.5  --   PLT 223  --   --   --  193  --   --  219  --   APTT 28  --   --   --   --   --   --   --   --   LABPROT 14.5  --   --   --   --   --   --   --   --   INR 1.12  --   --   --   --   --   --   --   --   HEPARINUNFRC  --   --   --   < >  --  0.88* 0.55  --  0.63  CREATININE 0.95  --   --   --  0.96  --   --   --   --   TROPONINI 0.57* 0.49* 0.53*  --  0.38*  --   --   --   --   < > = values in this interval not displayed.  Estimated Creatinine Clearance: 66.8 mL/min (by C-G formula based on Cr of 0.96).   Medications:  Infusions:  . sodium chloride    . heparin 1,000 Units/hr (08/16/15 0800)  . nitroGLYCERIN 20 mcg/min (08/16/15 0800)    Assessment: 64 y/o male s/p cath with 2 vessel CAD on IV heparin awaiting CABG. Pharmacy consulted to dose heparin.  Heparin level is therapeutic at 0.63 on 1000 units/hr. CBC stable, no bleeding noted.  Goal of Therapy:  Heparin level 0.3-0.7 units/ml Monitor platelets by anticoagulation protocol: Yes   Plan:  - Continue heparin at 1000 units/hr - Daily heparin level and CBC - Monitor for s/sx of bleeding  Heloise Ochoa, Pharm.D. PGY2 Cardiology Pharmacy Resident Pager: [phone]  08/16/2015 9:25 AM

## 2015-08-17 ENCOUNTER — Encounter (HOSPITAL_COMMUNITY): Payer: Self-pay | Admitting: Certified Registered Nurse Anesthetist

## 2015-08-17 DIAGNOSIS — I25110 Atherosclerotic heart disease of native coronary artery with unstable angina pectoris: Secondary | ICD-10-CM

## 2015-08-17 LAB — CBC
HEMATOCRIT: 41.4 % (ref 39.0–52.0)
Hemoglobin: 14.1 g/dL (ref 13.0–17.0)
MCH: 27.4 pg (ref 26.0–34.0)
MCHC: 34.1 g/dL (ref 30.0–36.0)
MCV: 80.5 fL (ref 78.0–100.0)
PLATELETS: 236 10*3/uL (ref 150–400)
RBC: 5.14 MIL/uL (ref 4.22–5.81)
RDW: 14.3 % (ref 11.5–15.5)
WBC: 9.5 10*3/uL (ref 4.0–10.5)

## 2015-08-17 LAB — COMPREHENSIVE METABOLIC PANEL
ALT: 47 U/L (ref 17–63)
AST: 51 U/L — AB (ref 15–41)
Albumin: 3.7 g/dL (ref 3.5–5.0)
Alkaline Phosphatase: 97 U/L (ref 38–126)
Anion gap: 10 (ref 5–15)
BILIRUBIN TOTAL: 0.3 mg/dL (ref 0.3–1.2)
BUN: 15 mg/dL (ref 6–20)
CHLORIDE: 103 mmol/L (ref 101–111)
CO2: 22 mmol/L (ref 22–32)
CREATININE: 1 mg/dL (ref 0.61–1.24)
Calcium: 9.5 mg/dL (ref 8.9–10.3)
Glucose, Bld: 116 mg/dL — ABNORMAL HIGH (ref 65–99)
Potassium: 4.1 mmol/L (ref 3.5–5.1)
Sodium: 135 mmol/L (ref 135–145)
TOTAL PROTEIN: 7 g/dL (ref 6.5–8.1)

## 2015-08-17 LAB — BLOOD GAS, ARTERIAL
Acid-base deficit: 1.4 mmol/L (ref 0.0–2.0)
BICARBONATE: 22.1 meq/L (ref 20.0–24.0)
Drawn by: 41308
FIO2: 0.21
O2 SAT: 94.3 %
PATIENT TEMPERATURE: 98.1
PO2 ART: 71.8 mmHg — AB (ref 80.0–100.0)
TCO2: 23.1 mmol/L (ref 0–100)
pCO2 arterial: 32.2 mmHg — ABNORMAL LOW (ref 35.0–45.0)
pH, Arterial: 7.449 (ref 7.350–7.450)

## 2015-08-17 LAB — TYPE AND SCREEN
ABO/RH(D): B POS
ANTIBODY SCREEN: NEGATIVE

## 2015-08-17 LAB — ABO/RH: ABO/RH(D): B POS

## 2015-08-17 LAB — HEPARIN LEVEL (UNFRACTIONATED): Heparin Unfractionated: 0.55 IU/mL (ref 0.30–0.70)

## 2015-08-17 MED ORDER — DOPAMINE-DEXTROSE 3.2-5 MG/ML-% IV SOLN
0.0000 ug/kg/min | INTRAVENOUS | Status: AC
  Administered 2015-08-18: 3 ug/kg/min via INTRAVENOUS
  Filled 2015-08-17: qty 250

## 2015-08-17 MED ORDER — ALPRAZOLAM 0.25 MG PO TABS: 0.2500 mg | ORAL_TABLET | Freq: Three times a day (TID) | ORAL | Status: DC | PRN

## 2015-08-17 MED ORDER — DEXTROSE 5 % IV SOLN
1.5000 g | INTRAVENOUS | Status: AC
  Administered 2015-08-18: 1.5 g via INTRAVENOUS
  Administered 2015-08-18: .75 g via INTRAVENOUS
  Filled 2015-08-17: qty 1.5

## 2015-08-17 MED ORDER — PHENYLEPHRINE HCL 10 MG/ML IJ SOLN
30.0000 ug/min | INTRAMUSCULAR | Status: AC
  Administered 2015-08-18 (×2): 10 ug/min via INTRAVENOUS
  Filled 2015-08-17: qty 2

## 2015-08-17 MED ORDER — METOPROLOL TARTRATE 12.5 MG HALF TABLET
12.5000 mg | ORAL_TABLET | Freq: Once | ORAL | Status: AC
  Administered 2015-08-18: 12.5 mg via ORAL
  Filled 2015-08-17: qty 1

## 2015-08-17 MED ORDER — MAGNESIUM SULFATE 50 % IJ SOLN
40.0000 meq | INTRAMUSCULAR | Status: DC
  Filled 2015-08-17: qty 10

## 2015-08-17 MED ORDER — SODIUM CHLORIDE 0.9 % IV SOLN
INTRAVENOUS | Status: AC
  Administered 2015-08-18: 1 [IU]/h via INTRAVENOUS
  Filled 2015-08-17: qty 2.5

## 2015-08-17 MED ORDER — PLASMA-LYTE 148 IV SOLN
INTRAVENOUS | Status: AC
  Administered 2015-08-18: 500 mL
  Filled 2015-08-17: qty 2.5

## 2015-08-17 MED ORDER — DIAZEPAM 5 MG PO TABS
5.0000 mg | ORAL_TABLET | Freq: Once | ORAL | Status: AC
  Administered 2015-08-18: 5 mg via ORAL
  Filled 2015-08-17: qty 1

## 2015-08-17 MED ORDER — VANCOMYCIN HCL 10 G IV SOLR
1250.0000 mg | INTRAVENOUS | Status: AC
  Administered 2015-08-18: 1250 mg via INTRAVENOUS
  Filled 2015-08-17: qty 1250

## 2015-08-17 MED ORDER — NITROGLYCERIN IN D5W 200-5 MCG/ML-% IV SOLN: 2.0000 ug/min | INTRAVENOUS | Status: DC

## 2015-08-17 MED ORDER — POTASSIUM CHLORIDE 2 MEQ/ML IV SOLN
80.0000 meq | INTRAVENOUS | Status: DC
  Filled 2015-08-17: qty 40

## 2015-08-17 MED ORDER — SODIUM CHLORIDE 0.9 % IV SOLN
INTRAVENOUS | Status: AC
  Administered 2015-08-18: 69.9 mL/h via INTRAVENOUS
  Filled 2015-08-17: qty 40

## 2015-08-17 MED ORDER — DEXMEDETOMIDINE HCL IN NACL 400 MCG/100ML IV SOLN
0.1000 ug/kg/h | INTRAVENOUS | Status: AC
  Administered 2015-08-18: .4 ug/kg/h via INTRAVENOUS
  Filled 2015-08-17: qty 100

## 2015-08-17 MED ORDER — DEXTROSE 5 % IV SOLN
750.0000 mg | INTRAVENOUS | Status: DC
  Filled 2015-08-17: qty 750

## 2015-08-17 MED ORDER — CHLORHEXIDINE GLUCONATE CLOTH 2 % EX PADS
6.0000 | MEDICATED_PAD | Freq: Once | CUTANEOUS | Status: AC
  Administered 2015-08-17: 6 via TOPICAL

## 2015-08-17 MED ORDER — EPINEPHRINE HCL 1 MG/ML IJ SOLN
0.0000 ug/min | INTRAVENOUS | Status: DC
  Filled 2015-08-17: qty 4

## 2015-08-17 MED ORDER — SODIUM CHLORIDE 0.9 % IV SOLN
INTRAVENOUS | Status: DC
  Filled 2015-08-17: qty 30

## 2015-08-17 MED ORDER — BISACODYL 5 MG PO TBEC: 5.0000 mg | DELAYED_RELEASE_TABLET | Freq: Once | ORAL | Status: DC

## 2015-08-17 NOTE — Progress Notes (Signed)
ANTICOAGULATION CONSULT NOTE - Follow Up Consult  Pharmacy Consult for heparin Indication: 2 vessel CAD (awaiting CABG)  No Known Allergies  Patient Measurements: Height: 5\' 4"  (162.6 cm) Weight: 148 lb 2.4 oz (67.2 kg) IBW/kg (Calculated) : 59.2 Heparin Dosing Weight: 70.1 kg  Vital Signs: Temp: 97.5 F (36.4 C) (08/18 0400) Temp Source: Oral (08/18 0400) BP: 136/83 mmHg (08/18 0600) Pulse Rate: 82 (08/18 0600)  Labs:  Recent Labs  08/14/15 1138 08/14/15 1851 08/14/15 2219  08/15/15 0441  08/15/15 2013 08/16/15 0250 08/16/15 0307 08/17/15 0251  HGB 13.3  --   --   --  13.2  --   --  14.1  --  14.1  HCT 39.3  --   --   --  38.9*  --   --  41.5  --  41.4  PLT 223  --   --   --  193  --   --  219  --  236  APTT 28  --   --   --   --   --   --   --   --   --   LABPROT 14.5  --   --   --   --   --   --   --   --   --   INR 1.12  --   --   --   --   --   --   --   --   --   HEPARINUNFRC  --   --   --   < >  --   < > 0.55  --  0.63 0.55  CREATININE 0.95  --   --   --  0.96  --   --   --   --   --   TROPONINI 0.57* 0.49* 0.53*  --  0.38*  --   --   --   --   --   < > = values in this interval not displayed.  Estimated Creatinine Clearance: 66.8 mL/min (by C-G formula based on Cr of 0.96).   Medications:  Infusions:  . sodium chloride    . heparin 1,000 Units/hr (08/16/15 2239)  . nitroGLYCERIN 15 mcg/min (08/17/15 0600)    Assessment: 64 y/o male s/p cath with 2 vessel CAD on IV heparin awaiting CABG. Pharmacy consulted to dose heparin.  Heparin level is therapeutic at 0.55 on 1000 units/hr. CBC stable, no bleeding noted.  Goal of Therapy:  Heparin level 0.3-0.7 units/ml Monitor platelets by anticoagulation protocol: Yes   Plan:  - Continue heparin at 1000 units/hr - Daily heparin level and CBC - Monitor for s/sx of bleeding - CABG planned tomorrow  Heloise Ochoa, Pharm.D. PGY2 Cardiology Pharmacy Resident Pager: [phone]  08/17/2015 7:29 AM

## 2015-08-17 NOTE — Anesthesia Preprocedure Evaluation (Addendum)
Anesthesia Evaluation  Patient identified by MRN, date of birth, ID band Patient awake    Reviewed: Allergy & Precautions, NPO status , Patient's Chart, lab work & pertinent test results, reviewed documented beta blocker date and time   Airway Mallampati: II  TM Distance: >3 FB Neck ROM: Full    Dental no notable dental hx.    Pulmonary neg pulmonary ROS,  breath sounds clear to auscultation  Pulmonary exam normal       Cardiovascular hypertension, Pt. on medications and Pt. on home beta blockers + CAD and + Past MI Normal cardiovascular examRhythm:Regular Rate:Normal     Neuro/Psych CVA, Residual Symptoms negative psych ROS   GI/Hepatic negative GI ROS, Neg liver ROS,   Endo/Other  Hypothyroidism   Renal/GU negative Renal ROS  negative genitourinary   Musculoskeletal negative musculoskeletal ROS (+) Arthritis -,   Abdominal   Peds  Hematology negative hematology ROS (+)   Anesthesia Other Findings   Reproductive/Obstetrics negative OB ROS                           Anesthesia Physical Anesthesia Plan  ASA: IV  Anesthesia Plan: General   Post-op Pain Management:    Induction: Intravenous  Airway Management Planned: Oral ETT  Additional Equipment: Arterial line, PA Cath, TEE, Ultrasound Guidance Line Placement and CVP  Intra-op Plan:   Post-operative Plan: Post-operative intubation/ventilation  Informed Consent: I have reviewed the patients History and Physical, chart, labs and discussed the procedure including the risks, benefits and alternatives for the proposed anesthesia with the patient or authorized representative who has indicated his/her understanding and acceptance.   Dental advisory given  Plan Discussed with: CRNA  Anesthesia Plan Comments: (Pt. with recent CVA s/p TPA. Clearly increased risk of hemorrhagic transformation. I discussed this with Dr. Roxan Hockey. He  states that he was aware of the CVA, but that the pt. has 100% chance of death without CABG. I spoke to the patient as well and explained that I cannot quantify his risk for CVA today, but that it is much higher than our normal population. )       Anesthesia Quick Evaluation

## 2015-08-17 NOTE — Progress Notes (Signed)
Pt complains of mild chest discomfort (5/10) this am ambulating to bathroom resolved with increase in ntg gtt; cardiologist aware in am rounds; chest pain returned (5/10) eating breakfast this am and resolved to 0/10 with oxygen 2L [place] applied and ntg gtt increased to 25mcg; will continue to closely monitor

## 2015-08-17 NOTE — Progress Notes (Signed)
3 Days Post-Op Procedure(s) (LRB): Left Heart Cath and Coronary Angiography (N/A) Subjective: Had 2 brief episodes of CP this AM, First one was at 0530 the time he has been consistently having pain. Second episode was after eating breakfast, 5/10, short-lived.  Objective: Vital signs in last 24 hours: Temp:  [97.5 F (36.4 C)-98.3 F (36.8 C)] 98.3 F (36.8 C) (08/18 0805) Pulse Rate:  [58-95] 95 (08/18 0930) Cardiac Rhythm:  [-] Normal sinus rhythm (08/18 0805) Resp:  [11-28] 17 (08/18 0930) BP: (86-155)/(52-91) 135/91 mmHg (08/18 0930) SpO2:  [93 %-100 %] 100 % (08/18 0930) Weight:  [148 lb 2.4 oz (67.2 kg)] 148 lb 2.4 oz (67.2 kg) (08/18 0500)  Hemodynamic parameters for last 24 hours:    Intake/Output from previous day: 08/17 0701 - 08/18 0700 In: 1560.6 [P.O.:1200; I.V.:360.6] Out: -  Intake/Output this shift: Total I/O In: 166.9 [P.O.:120; I.V.:46.9] Out: -   General appearance: alert, cooperative and no distress Heart: regular rate and rhythm Lungs: clear to auscultation bilaterally  Lab Results:  Recent Labs  08/16/15 0250 08/17/15 0251  WBC 9.5 9.5  HGB 14.1 14.1  HCT 41.5 41.4  PLT 219 236   BMET:  Recent Labs  08/14/15 1138 08/15/15 0441  NA 134* 134*  K 4.1 3.9  CL 102 103  CO2 24 22  GLUCOSE 118* 88  BUN 10 9  CREATININE 0.95 0.96  CALCIUM 9.5 8.7*    PT/INR:  Recent Labs  08/14/15 1138  LABPROT 14.5  INR 1.12   ABG No results found for: PHART, HCO3, TCO2, ACIDBASEDEF, O2SAT CBG (last 3)  No results for input(s): GLUCAP in the last 72 hours.  Assessment/Plan: S/P Procedure(s) (LRB): Left Heart Cath and Coronary Angiography (N/A) -  Very anxious He ate breakfast this AM Had 2 transient episodes of CP this AM For CABG in AM unless pain becomes unstable    LOS: 3 days    Melrose Nakayama 08/17/2015

## 2015-08-17 NOTE — Progress Notes (Signed)
Pt resting c no current chest pain; NTG gtt, heparin gtt infusing per MD orders; pt wishes to ambulate to use bathroom in private; discussed dangers of ambulating with left main heart disease and intermittent pain; pt states he understands risk and still wishes to go to bathroom privately; will continue to closely monitor

## 2015-08-17 NOTE — Progress Notes (Signed)
       Patient Name: Adam Shelton Date of Encounter: 08/17/2015    SUBJECTIVE: He had chest discomfort early this morning when he went to the bathroom. Nitroglycerin was titrated with pain relief.  TELEMETRY:  Normal sinus rhythm Filed Vitals:   08/17/15 0400 08/17/15 0500 08/17/15 0600 08/17/15 0700  BP: 94/69 86/69 136/83 132/80  Pulse: 61 63 82 72  Temp: 97.5 F (36.4 C)     TempSrc: Oral     Resp: 17 22 18 18   Height:      Weight:  67.2 kg (148 lb 2.4 oz)    SpO2: 97% 93% 100% 99%    Intake/Output Summary (Last 24 hours) at 08/17/15 0742 Last data filed at 08/17/15 0700  Gross per 24 hour  Intake 1560.6 ml  Output      0 ml  Net 1560.6 ml   LABS: Basic Metabolic Panel:  Recent Labs  08/14/15 1138 08/14/15 1851 08/15/15 0441  NA 134*  --  134*  K 4.1  --  3.9  CL 102  --  103  CO2 24  --  22  GLUCOSE 118*  --  88  BUN 10  --  9  CREATININE 0.95  --  0.96  CALCIUM 9.5  --  8.7*  MG  --  1.6*  --    CBC:  Recent Labs  08/16/15 0250 08/17/15 0251  WBC 9.5 9.5  HGB 14.1 14.1  HCT 41.5 41.4  MCV 80.3 80.5  PLT 219 236   Cardiac Enzymes:  Recent Labs  08/14/15 1851 08/14/15 2219 08/15/15 0441  TROPONINI 0.49* 0.53* 0.38*   BNP: Invalid input(s): POCBNP Hemoglobin A1C:  Recent Labs  08/14/15 1851  HGBA1C 5.8*   Fasting Lipid Panel:  Recent Labs  08/15/15 0441  CHOL 133  HDL 32*  LDLCALC 85  TRIG 80  CHOLHDL 4.2    Radiology/Studies:  No new data  Physical Exam: Blood pressure 132/80, pulse 72, temperature 97.5 F (36.4 C), temperature source Oral, resp. rate 18, height 5\' 4"  (1.626 m), weight 67.2 kg (148 lb 2.4 oz), SpO2 99 %. Weight change: -1.2 kg (-2 lb 10.3 oz)  Wt Readings from Last 3 Encounters:  08/17/15 67.2 kg (148 lb 2.4 oz)  11/12/11 75.751 kg (167 lb)  04/02/11 73.936 kg (163 lb)    Chest is clear Cardiac exam reveals no S4 gallop Extremities reveal no edema Neurological exam is  intact  ASSESSMENT:  1. Multivessel coronary artery disease, for CABG today. 2. Acute Non-ST elevation myocardial infarction treated with PCI 3. Relatively recent CVA, June 2016 treated with TPA 4. Acute combined systolic and diastolic heart failure. EF 45%. 5. Diabetes mellitus, type II, complicated  Plan:  1. Continue IV nitroglycerin and further titration for pain if it recurs. 2. For coronary artery bypass grafting tomorrow by Dr. Roxan Hockey  Signed, Sinclair Grooms 08/17/2015, 7:42 AM

## 2015-08-18 ENCOUNTER — Inpatient Hospital Stay (HOSPITAL_COMMUNITY): Payer: BLUE CROSS/BLUE SHIELD | Admitting: Certified Registered Nurse Anesthetist

## 2015-08-18 ENCOUNTER — Inpatient Hospital Stay (HOSPITAL_COMMUNITY): Payer: BLUE CROSS/BLUE SHIELD

## 2015-08-18 ENCOUNTER — Inpatient Hospital Stay (HOSPITAL_COMMUNITY)
Admit: 2015-08-18 | Discharge: 2015-08-18 | Disposition: A | Discharge status: Home / Self Care | Payer: BLUE CROSS/BLUE SHIELD | Attending: Thoracic Surgery (Cardiothoracic Vascular Surgery) | Admitting: Thoracic Surgery (Cardiothoracic Vascular Surgery)

## 2015-08-18 ENCOUNTER — Encounter (HOSPITAL_COMMUNITY)
Admission: EM | Disposition: A | Discharge status: Home / Self Care | Payer: PRIVATE HEALTH INSURANCE | Source: Home / Self Care | Attending: Thoracic Surgery (Cardiothoracic Vascular Surgery)

## 2015-08-18 DIAGNOSIS — Z951 Presence of aortocoronary bypass graft: Secondary | ICD-10-CM

## 2015-08-18 HISTORY — PX: TEE WITHOUT CARDIOVERSION: SHX5443

## 2015-08-18 HISTORY — PX: CORONARY ARTERY BYPASS GRAFT: SHX141

## 2015-08-18 LAB — POCT I-STAT, CHEM 8
BUN: 14 mg/dL (ref 6–20)
BUN: 11 mg/dL (ref 6–20)
BUN: 12 mg/dL (ref 6–20)
BUN: 12 mg/dL (ref 6–20)
BUN: 11 mg/dL (ref 6–20)
BUN: 10 mg/dL (ref 6–20)
CALCIUM ION: 1.14 mmol/L (ref 1.13–1.30)
CALCIUM ION: 1.05 mmol/L — AB (ref 1.13–1.30)
CALCIUM ION: 1.08 mmol/L — AB (ref 1.13–1.30)
CALCIUM ION: 1.19 mmol/L (ref 1.13–1.30)
CHLORIDE: 103 mmol/L (ref 101–111)
CHLORIDE: 101 mmol/L (ref 101–111)
CHLORIDE: 96 mmol/L — AB (ref 101–111)
CHLORIDE: 98 mmol/L — AB (ref 101–111)
CREATININE: 0.7 mg/dL (ref 0.61–1.24)
CREATININE: 0.8 mg/dL (ref 0.61–1.24)
CREATININE: 0.8 mg/dL (ref 0.61–1.24)
CREATININE: 0.7 mg/dL (ref 0.61–1.24)
Calcium, Ion: 1.23 mmol/L (ref 1.13–1.30)
Calcium, Ion: 1.02 mmol/L — ABNORMAL LOW (ref 1.13–1.30)
Chloride: 100 mmol/L — ABNORMAL LOW (ref 101–111)
Chloride: 102 mmol/L (ref 101–111)
Creatinine, Ser: 0.7 mg/dL (ref 0.61–1.24)
Creatinine, Ser: 0.8 mg/dL (ref 0.61–1.24)
GLUCOSE: 98 mg/dL (ref 65–99)
GLUCOSE: 134 mg/dL — AB (ref 65–99)
GLUCOSE: 191 mg/dL — AB (ref 65–99)
GLUCOSE: 156 mg/dL — AB (ref 65–99)
GLUCOSE: 153 mg/dL — AB (ref 65–99)
GLUCOSE: 160 mg/dL — AB (ref 65–99)
HCT: 44 % (ref 39.0–52.0)
HCT: 29 % — ABNORMAL LOW (ref 39.0–52.0)
HCT: 28 % — ABNORMAL LOW (ref 39.0–52.0)
HCT: 33 % — ABNORMAL LOW (ref 39.0–52.0)
HCT: 35 % — ABNORMAL LOW (ref 39.0–52.0)
HEMATOCRIT: 39 % (ref 39.0–52.0)
HEMOGLOBIN: 15 g/dL (ref 13.0–17.0)
HEMOGLOBIN: 13.3 g/dL (ref 13.0–17.0)
HEMOGLOBIN: 9.5 g/dL — AB (ref 13.0–17.0)
HEMOGLOBIN: 11.2 g/dL — AB (ref 13.0–17.0)
Hemoglobin: 9.9 g/dL — ABNORMAL LOW (ref 13.0–17.0)
Hemoglobin: 11.9 g/dL — ABNORMAL LOW (ref 13.0–17.0)
POTASSIUM: 4 mmol/L (ref 3.5–5.1)
POTASSIUM: 5.4 mmol/L — AB (ref 3.5–5.1)
Potassium: 3.6 mmol/L (ref 3.5–5.1)
Potassium: 4.1 mmol/L (ref 3.5–5.1)
Potassium: 4.5 mmol/L (ref 3.5–5.1)
Potassium: 4.3 mmol/L (ref 3.5–5.1)
SODIUM: 137 mmol/L (ref 135–145)
Sodium: 135 mmol/L (ref 135–145)
Sodium: 129 mmol/L — ABNORMAL LOW (ref 135–145)
Sodium: 133 mmol/L — ABNORMAL LOW (ref 135–145)
Sodium: 128 mmol/L — ABNORMAL LOW (ref 135–145)
Sodium: 134 mmol/L — ABNORMAL LOW (ref 135–145)
TCO2: 22 mmol/L (ref 0–100)
TCO2: 19 mmol/L (ref 0–100)
TCO2: 20 mmol/L (ref 0–100)
TCO2: 21 mmol/L (ref 0–100)
TCO2: 20 mmol/L (ref 0–100)
TCO2: 23 mmol/L (ref 0–100)

## 2015-08-18 LAB — POCT I-STAT 3, VENOUS BLOOD GAS (G3P V)
ACID-BASE DEFICIT: 6 mmol/L — AB (ref 0.0–2.0)
BICARBONATE: 19.8 meq/L — AB (ref 20.0–24.0)
O2 Saturation: 84 %
PH VEN: 7.339 — AB (ref 7.250–7.300)
PO2 VEN: 45 mmHg (ref 30.0–45.0)
Patient temperature: 34.5
TCO2: 21 mmol/L (ref 0–100)
pCO2, Ven: 35.7 mmHg — ABNORMAL LOW (ref 45.0–50.0)

## 2015-08-18 LAB — CBC
HCT: 37 % — ABNORMAL LOW (ref 39.0–52.0)
HEMATOCRIT: 39.9 % (ref 39.0–52.0)
HEMATOCRIT: 33.7 % — AB (ref 39.0–52.0)
HEMOGLOBIN: 13.5 g/dL (ref 13.0–17.0)
Hemoglobin: 12.5 g/dL — ABNORMAL LOW (ref 13.0–17.0)
Hemoglobin: 11.3 g/dL — ABNORMAL LOW (ref 13.0–17.0)
MCH: 27.3 pg (ref 26.0–34.0)
MCH: 27.1 pg (ref 26.0–34.0)
MCH: 27 pg (ref 26.0–34.0)
MCHC: 33.8 g/dL (ref 30.0–36.0)
MCHC: 33.8 g/dL (ref 30.0–36.0)
MCHC: 33.5 g/dL (ref 30.0–36.0)
MCV: 80.8 fL (ref 78.0–100.0)
MCV: 80.3 fL (ref 78.0–100.0)
MCV: 80.4 fL (ref 78.0–100.0)
Platelets: 241 10*3/uL (ref 150–400)
Platelets: 138 10*3/uL — ABNORMAL LOW (ref 150–400)
Platelets: 173 10*3/uL (ref 150–400)
RBC: 4.94 MIL/uL (ref 4.22–5.81)
RBC: 4.61 MIL/uL (ref 4.22–5.81)
RBC: 4.19 MIL/uL — ABNORMAL LOW (ref 4.22–5.81)
RDW: 14.3 % (ref 11.5–15.5)
RDW: 14.3 % (ref 11.5–15.5)
RDW: 14.4 % (ref 11.5–15.5)
WBC: 11.1 10*3/uL — AB (ref 4.0–10.5)
WBC: 11.7 10*3/uL — ABNORMAL HIGH (ref 4.0–10.5)
WBC: 12.9 10*3/uL — AB (ref 4.0–10.5)

## 2015-08-18 LAB — POCT I-STAT 3, ART BLOOD GAS (G3+)
ACID-BASE DEFICIT: 3 mmol/L — AB (ref 0.0–2.0)
ACID-BASE DEFICIT: 5 mmol/L — AB (ref 0.0–2.0)
ACID-BASE DEFICIT: 5 mmol/L — AB (ref 0.0–2.0)
ACID-BASE DEFICIT: 4 mmol/L — AB (ref 0.0–2.0)
Acid-base deficit: 2 mmol/L (ref 0.0–2.0)
BICARBONATE: 21.7 meq/L (ref 20.0–24.0)
Bicarbonate: 22.5 mEq/L (ref 20.0–24.0)
Bicarbonate: 20.6 mEq/L (ref 20.0–24.0)
Bicarbonate: 22.3 mEq/L (ref 20.0–24.0)
Bicarbonate: 23.5 mEq/L (ref 20.0–24.0)
O2 SAT: 100 %
O2 SAT: 94 %
O2 SAT: 87 %
O2 SAT: 93 %
O2 Saturation: 92 %
PCO2 ART: 35.7 mmHg (ref 35.0–45.0)
PH ART: 7.331 — AB (ref 7.350–7.450)
PH ART: 7.318 — AB (ref 7.350–7.450)
PH ART: 7.371 (ref 7.350–7.450)
PO2 ART: 296 mmHg — AB (ref 80.0–100.0)
PO2 ART: 68 mmHg — AB (ref 80.0–100.0)
Patient temperature: 34.4
TCO2: 24 mmol/L (ref 0–100)
TCO2: 23 mmol/L (ref 0–100)
TCO2: 22 mmol/L (ref 0–100)
TCO2: 24 mmol/L (ref 0–100)
TCO2: 25 mmol/L (ref 0–100)
pCO2 arterial: 45.9 mmHg — ABNORMAL HIGH (ref 35.0–45.0)
pCO2 arterial: 38.6 mmHg (ref 35.0–45.0)
pCO2 arterial: 43.5 mmHg (ref 35.0–45.0)
pCO2 arterial: 40.7 mmHg (ref 35.0–45.0)
pH, Arterial: 7.396 (ref 7.350–7.450)
pH, Arterial: 7.282 — ABNORMAL LOW (ref 7.350–7.450)
pO2, Arterial: 80 mmHg (ref 80.0–100.0)
pO2, Arterial: 54 mmHg — ABNORMAL LOW (ref 80.0–100.0)
pO2, Arterial: 72 mmHg — ABNORMAL LOW (ref 80.0–100.0)

## 2015-08-18 LAB — PREPARE FRESH FROZEN PLASMA
UNIT DIVISION: 0
UNIT DIVISION: 0
Unit division: 0
Unit division: 0

## 2015-08-18 LAB — URINALYSIS W MICROSCOPIC (NOT AT ARMC)
Bilirubin Urine: NEGATIVE
GLUCOSE, UA: NEGATIVE mg/dL
HGB URINE DIPSTICK: NEGATIVE
Ketones, ur: NEGATIVE mg/dL
Leukocytes, UA: NEGATIVE
Nitrite: NEGATIVE
Protein, ur: NEGATIVE mg/dL
SPECIFIC GRAVITY, URINE: 1.014 (ref 1.005–1.030)
Urobilinogen, UA: 0.2 mg/dL (ref 0.0–1.0)
pH: 7 (ref 5.0–8.0)

## 2015-08-18 LAB — GLUCOSE, CAPILLARY
GLUCOSE-CAPILLARY: 99 mg/dL (ref 65–99)
GLUCOSE-CAPILLARY: 143 mg/dL — AB (ref 65–99)
Glucose-Capillary: 105 mg/dL — ABNORMAL HIGH (ref 65–99)
Glucose-Capillary: 92 mg/dL (ref 65–99)

## 2015-08-18 LAB — CREATININE, SERUM: Creatinine, Ser: 0.93 mg/dL (ref 0.61–1.24)

## 2015-08-18 LAB — POCT I-STAT 4, (NA,K, GLUC, HGB,HCT)
Glucose, Bld: 126 mg/dL — ABNORMAL HIGH (ref 65–99)
HCT: 38 % — ABNORMAL LOW (ref 39.0–52.0)
Hemoglobin: 12.9 g/dL — ABNORMAL LOW (ref 13.0–17.0)
POTASSIUM: 4.1 mmol/L (ref 3.5–5.1)
SODIUM: 136 mmol/L (ref 135–145)

## 2015-08-18 LAB — CARBOXYHEMOGLOBIN
CARBOXYHEMOGLOBIN: 1.1 % (ref 0.5–1.5)
Methemoglobin: 1.6 % — ABNORMAL HIGH (ref 0.0–1.5)
O2 SAT: 68.9 %
TOTAL HEMOGLOBIN: 12.9 g/dL — AB (ref 13.5–18.0)

## 2015-08-18 LAB — PROTIME-INR
INR: 1.49 (ref 0.00–1.49)
PROTHROMBIN TIME: 18.1 s — AB (ref 11.6–15.2)

## 2015-08-18 LAB — HEPARIN LEVEL (UNFRACTIONATED): HEPARIN UNFRACTIONATED: 0.52 [IU]/mL (ref 0.30–0.70)

## 2015-08-18 LAB — MAGNESIUM: MAGNESIUM: 2.4 mg/dL (ref 1.7–2.4)

## 2015-08-18 LAB — POCT I-STAT GLUCOSE
Glucose, Bld: 174 mg/dL — ABNORMAL HIGH (ref 65–99)
OPERATOR ID: 3390

## 2015-08-18 LAB — HEMOGLOBIN AND HEMATOCRIT, BLOOD
HCT: 26.5 % — ABNORMAL LOW (ref 39.0–52.0)
HEMOGLOBIN: 9.1 g/dL — AB (ref 13.0–17.0)

## 2015-08-18 LAB — APTT: aPTT: 31 seconds (ref 24–37)

## 2015-08-18 LAB — PLATELET COUNT: PLATELETS: 115 10*3/uL — AB (ref 150–400)

## 2015-08-18 SURGERY — CORONARY ARTERY BYPASS GRAFTING (CABG)
Anesthesia: General | Site: Chest

## 2015-08-18 MED ORDER — PROPOFOL 10 MG/ML IV BOLUS
INTRAVENOUS | Status: DC | PRN
  Administered 2015-08-18: 50 mg via INTRAVENOUS

## 2015-08-18 MED ORDER — PHENYLEPHRINE HCL 10 MG/ML IJ SOLN
INTRAMUSCULAR | Status: DC | PRN
  Administered 2015-08-18 (×2): 40 ug via INTRAVENOUS

## 2015-08-18 MED ORDER — ETOMIDATE 2 MG/ML IV SOLN
INTRAVENOUS | Status: AC
  Filled 2015-08-18: qty 10

## 2015-08-18 MED ORDER — LACTATED RINGERS IV SOLN: INTRAVENOUS | Status: DC

## 2015-08-18 MED ORDER — PHENYLEPHRINE HCL 10 MG/ML IJ SOLN
0.0000 ug/min | INTRAVENOUS | Status: DC
  Administered 2015-08-18: 5 ug/min via INTRAVENOUS

## 2015-08-18 MED ORDER — ONDANSETRON HCL 4 MG/2ML IJ SOLN
4.0000 mg | Freq: Four times a day (QID) | INTRAMUSCULAR | Status: DC | PRN
  Administered 2015-08-19 – 2015-08-20 (×2): 4 mg via INTRAVENOUS
  Filled 2015-08-18 (×2): qty 2

## 2015-08-18 MED ORDER — PHENYLEPHRINE 40 MCG/ML (10ML) SYRINGE FOR IV PUSH (FOR BLOOD PRESSURE SUPPORT)
PREFILLED_SYRINGE | INTRAVENOUS | Status: AC
Start: 2015-08-18 — End: 2015-08-18
  Filled 2015-08-18: qty 10

## 2015-08-18 MED ORDER — PROPOFOL 10 MG/ML IV BOLUS
INTRAVENOUS | Status: AC
  Filled 2015-08-18: qty 20

## 2015-08-18 MED ORDER — SODIUM CHLORIDE 0.9 % IV SOLN
INTRAVENOUS | Status: DC
  Administered 2015-08-18: 3.3 [IU]/h via INTRAVENOUS

## 2015-08-18 MED ORDER — FENTANYL CITRATE (PF) 250 MCG/5ML IJ SOLN
INTRAMUSCULAR | Status: AC
  Filled 2015-08-18: qty 5

## 2015-08-18 MED ORDER — MIDAZOLAM HCL 10 MG/2ML IJ SOLN
INTRAMUSCULAR | Status: AC
  Filled 2015-08-18: qty 4

## 2015-08-18 MED ORDER — DEXTROSE 5 % IV SOLN
1.5000 g | Freq: Two times a day (BID) | INTRAVENOUS | Status: AC
  Administered 2015-08-18 – 2015-08-20 (×4): 1.5 g via INTRAVENOUS
  Filled 2015-08-18 (×4): qty 1.5

## 2015-08-18 MED ORDER — MIDAZOLAM HCL 2 MG/2ML IJ SOLN: 2.0000 mg | INTRAMUSCULAR | Status: AC | PRN

## 2015-08-18 MED ORDER — METOPROLOL TARTRATE 25 MG/10 ML ORAL SUSPENSION
12.5000 mg | Freq: Two times a day (BID) | ORAL | Status: DC
  Filled 2015-08-18 (×5): qty 5

## 2015-08-18 MED ORDER — SUCCINYLCHOLINE CHLORIDE 20 MG/ML IJ SOLN
INTRAMUSCULAR | Status: AC
  Filled 2015-08-18: qty 1

## 2015-08-18 MED ORDER — SODIUM BICARBONATE 8.4 % IV SOLN
50.0000 meq | Freq: Once | INTRAVENOUS | Status: AC
Start: 2015-08-18 — End: 2015-08-18
  Administered 2015-08-18: 50 meq via INTRAVENOUS

## 2015-08-18 MED ORDER — BISACODYL 5 MG PO TBEC
10.0000 mg | DELAYED_RELEASE_TABLET | Freq: Every day | ORAL | Status: DC
  Administered 2015-08-19 – 2015-08-22 (×4): 10 mg via ORAL
  Filled 2015-08-18 (×4): qty 2

## 2015-08-18 MED ORDER — LIDOCAINE HCL (CARDIAC) 20 MG/ML IV SOLN
INTRAVENOUS | Status: AC
  Filled 2015-08-18: qty 5

## 2015-08-18 MED ORDER — HEPARIN SODIUM (PORCINE) 1000 UNIT/ML IJ SOLN
INTRAMUSCULAR | Status: DC | PRN
  Administered 2015-08-18: 26000 [IU] via INTRAVENOUS
  Administered 2015-08-18: 5000 [IU] via INTRAVENOUS

## 2015-08-18 MED ORDER — FAMOTIDINE IN NACL 20-0.9 MG/50ML-% IV SOLN
20.0000 mg | Freq: Two times a day (BID) | INTRAVENOUS | Status: DC
  Administered 2015-08-18: 20 mg via INTRAVENOUS

## 2015-08-18 MED ORDER — OXYCODONE HCL 5 MG PO TABS
5.0000 mg | ORAL_TABLET | ORAL | Status: DC | PRN
  Administered 2015-08-19 – 2015-08-20 (×6): 10 mg via ORAL
  Administered 2015-08-20: 5 mg via ORAL
  Filled 2015-08-18 (×7): qty 2

## 2015-08-18 MED ORDER — LACTATED RINGERS IV SOLN
INTRAVENOUS | Status: DC | PRN
  Administered 2015-08-18 (×6): via INTRAVENOUS

## 2015-08-18 MED ORDER — INSULIN ASPART 100 UNIT/ML ~~LOC~~ SOLN
0.0000 [IU] | SUBCUTANEOUS | Status: DC
  Administered 2015-08-18 – 2015-08-19 (×2): 4 [IU] via SUBCUTANEOUS
  Administered 2015-08-19 (×2): 2 [IU] via SUBCUTANEOUS

## 2015-08-18 MED ORDER — ACETAMINOPHEN 500 MG PO TABS
1000.0000 mg | ORAL_TABLET | Freq: Four times a day (QID) | ORAL | Status: DC
  Administered 2015-08-18 – 2015-08-23 (×15): 1000 mg via ORAL
  Filled 2015-08-18 (×19): qty 2

## 2015-08-18 MED ORDER — DOCUSATE SODIUM 100 MG PO CAPS
200.0000 mg | ORAL_CAPSULE | Freq: Every day | ORAL | Status: DC
  Administered 2015-08-19 – 2015-08-22 (×4): 200 mg via ORAL
  Filled 2015-08-18 (×4): qty 2

## 2015-08-18 MED ORDER — SODIUM BICARBONATE 8.4 % IV SOLN
50.0000 meq | Freq: Once | INTRAVENOUS | Status: AC
  Administered 2015-08-18: 50 meq via INTRAVENOUS

## 2015-08-18 MED ORDER — EPHEDRINE SULFATE 50 MG/ML IJ SOLN
INTRAMUSCULAR | Status: AC
  Filled 2015-08-18: qty 1

## 2015-08-18 MED ORDER — ANTISEPTIC ORAL RINSE SOLUTION (CORINZ)
7.0000 mL | Freq: Four times a day (QID) | OROMUCOSAL | Status: DC
  Administered 2015-08-18 – 2015-08-21 (×6): 7 mL via OROMUCOSAL

## 2015-08-18 MED ORDER — LACTATED RINGERS IV SOLN
INTRAVENOUS | Status: DC
  Administered 2015-08-18: 20 mL/h via INTRAVENOUS
  Administered 2015-08-19: via INTRAVENOUS

## 2015-08-18 MED ORDER — VECURONIUM BROMIDE 10 MG IV SOLR: INTRAVENOUS | Status: DC | PRN

## 2015-08-18 MED ORDER — ROCURONIUM BROMIDE 100 MG/10ML IV SOLN
INTRAVENOUS | Status: DC | PRN
  Administered 2015-08-18: 50 mg via INTRAVENOUS

## 2015-08-18 MED ORDER — SODIUM CHLORIDE 0.9 % IJ SOLN: 3.0000 mL | INTRAMUSCULAR | Status: DC | PRN

## 2015-08-18 MED ORDER — MORPHINE SULFATE (PF) 2 MG/ML IV SOLN
2.0000 mg | INTRAVENOUS | Status: DC | PRN
  Administered 2015-08-18 (×2): 4 mg via INTRAVENOUS
  Administered 2015-08-19 (×3): 2 mg via INTRAVENOUS
  Filled 2015-08-18: qty 2
  Filled 2015-08-18: qty 1
  Filled 2015-08-18: qty 2
  Filled 2015-08-18 (×2): qty 1

## 2015-08-18 MED ORDER — MORPHINE SULFATE (PF) 2 MG/ML IV SOLN: 1.0000 mg | INTRAVENOUS | Status: DC | PRN

## 2015-08-18 MED ORDER — NOREPINEPHRINE BITARTRATE 1 MG/ML IV SOLN
0.0000 ug/min | INTRAVENOUS | Status: DC
  Filled 2015-08-18: qty 4

## 2015-08-18 MED ORDER — SODIUM CHLORIDE 0.9 % IJ SOLN
3.0000 mL | Freq: Two times a day (BID) | INTRAMUSCULAR | Status: DC
  Administered 2015-08-19: 10 mL via INTRAVENOUS
  Administered 2015-08-19 – 2015-08-21 (×3): 3 mL via INTRAVENOUS

## 2015-08-18 MED ORDER — METOPROLOL TARTRATE 1 MG/ML IV SOLN
INTRAVENOUS | Status: DC | PRN
  Administered 2015-08-18 (×2): 2.5 mg via INTRAVENOUS

## 2015-08-18 MED ORDER — PHENYLEPHRINE 40 MCG/ML (10ML) SYRINGE FOR IV PUSH (FOR BLOOD PRESSURE SUPPORT)
PREFILLED_SYRINGE | INTRAVENOUS | Status: AC
  Filled 2015-08-18: qty 10

## 2015-08-18 MED ORDER — ALBUMIN HUMAN 5 % IV SOLN
250.0000 mL | INTRAVENOUS | Status: AC | PRN
  Administered 2015-08-18 (×4): 250 mL via INTRAVENOUS
  Filled 2015-08-18 (×2): qty 250

## 2015-08-18 MED ORDER — SODIUM CHLORIDE 0.9 % IJ SOLN
INTRAMUSCULAR | Status: DC | PRN
  Administered 2015-08-18: 4 mL via TOPICAL
  Administered 2015-08-18: 12 mL via TOPICAL

## 2015-08-18 MED ORDER — NITROGLYCERIN IN D5W 200-5 MCG/ML-% IV SOLN: 0.0000 ug/min | INTRAVENOUS | Status: DC

## 2015-08-18 MED ORDER — EPINEPHRINE HCL 0.1 MG/ML IJ SOSY
PREFILLED_SYRINGE | INTRAMUSCULAR | Status: DC | PRN
  Administered 2015-08-18 (×2): 200 ug via INTRAVENOUS
  Administered 2015-08-18: 100 ug via INTRAVENOUS

## 2015-08-18 MED ORDER — SODIUM CHLORIDE 0.9 % IJ SOLN
INTRAMUSCULAR | Status: AC
  Filled 2015-08-18: qty 10

## 2015-08-18 MED ORDER — NOREPINEPHRINE BITARTRATE 1 MG/ML IV SOLN
4000.0000 ug | INTRAVENOUS | Status: DC | PRN
  Administered 2015-08-18: 6 ug/min via INTRAVENOUS

## 2015-08-18 MED ORDER — LIDOCAINE HCL (CARDIAC) 20 MG/ML IV SOLN
INTRAVENOUS | Status: DC | PRN
  Administered 2015-08-18: 100 mg via INTRAVENOUS

## 2015-08-18 MED ORDER — ASPIRIN EC 325 MG PO TBEC
325.0000 mg | DELAYED_RELEASE_TABLET | Freq: Every day | ORAL | Status: DC
  Administered 2015-08-19 – 2015-08-21 (×3): 325 mg via ORAL
  Filled 2015-08-18 (×3): qty 1

## 2015-08-18 MED ORDER — PROTAMINE SULFATE 10 MG/ML IV SOLN
INTRAVENOUS | Status: DC | PRN
  Administered 2015-08-18: 310 mg via INTRAVENOUS

## 2015-08-18 MED ORDER — TRAMADOL HCL 50 MG PO TABS
50.0000 mg | ORAL_TABLET | ORAL | Status: DC | PRN
  Administered 2015-08-21: 100 mg via ORAL
  Filled 2015-08-18: qty 2

## 2015-08-18 MED ORDER — CALCIUM CHLORIDE 10 % IV SOLN
1.0000 g | Freq: Once | INTRAVENOUS | Status: AC
Start: 2015-08-18 — End: 2015-08-18
  Administered 2015-08-18: 1 g via INTRAVENOUS

## 2015-08-18 MED ORDER — ACETAMINOPHEN 650 MG RE SUPP
650.0000 mg | Freq: Once | RECTAL | Status: AC
  Administered 2015-08-18: 650 mg via RECTAL

## 2015-08-18 MED ORDER — LACTATED RINGERS IV SOLN: 500.0000 mL | Freq: Once | INTRAVENOUS | Status: DC | PRN

## 2015-08-18 MED ORDER — ATORVASTATIN CALCIUM 80 MG PO TABS
80.0000 mg | ORAL_TABLET | Freq: Every day | ORAL | Status: DC
  Administered 2015-08-19 – 2015-08-23 (×5): 80 mg via ORAL
  Filled 2015-08-18 (×6): qty 1

## 2015-08-18 MED ORDER — SODIUM CHLORIDE 0.9 % IV SOLN
INTRAVENOUS | Status: DC
  Administered 2015-08-18: 10 mL/h via INTRAVENOUS

## 2015-08-18 MED ORDER — ROCURONIUM BROMIDE 50 MG/5ML IV SOLN
INTRAVENOUS | Status: AC
  Filled 2015-08-18: qty 1

## 2015-08-18 MED ORDER — ACETAMINOPHEN 160 MG/5ML PO SOLN: 650.0000 mg | Freq: Once | ORAL | Status: AC

## 2015-08-18 MED ORDER — METOPROLOL TARTRATE 12.5 MG HALF TABLET
12.5000 mg | ORAL_TABLET | Freq: Two times a day (BID) | ORAL | Status: DC
  Administered 2015-08-19 – 2015-08-20 (×3): 12.5 mg via ORAL
  Filled 2015-08-18 (×6): qty 1

## 2015-08-18 MED ORDER — METOPROLOL TARTRATE 1 MG/ML IV SOLN: 2.5000 mg | INTRAVENOUS | Status: DC | PRN

## 2015-08-18 MED ORDER — SODIUM CHLORIDE 0.9 % IV SOLN: 250.0000 mL | INTRAVENOUS | Status: DC

## 2015-08-18 MED ORDER — VANCOMYCIN HCL IN DEXTROSE 1-5 GM/200ML-% IV SOLN
1000.0000 mg | Freq: Once | INTRAVENOUS | Status: AC
Start: 2015-08-18 — End: 2015-08-18
  Administered 2015-08-18: 1000 mg via INTRAVENOUS
  Filled 2015-08-18: qty 200

## 2015-08-18 MED ORDER — INSULIN REGULAR BOLUS VIA INFUSION
0.0000 [IU] | Freq: Three times a day (TID) | INTRAVENOUS | Status: DC
  Filled 2015-08-18: qty 10

## 2015-08-18 MED ORDER — HEMOSTATIC AGENTS (NO CHARGE) OPTIME
TOPICAL | Status: DC | PRN
  Administered 2015-08-18: 1 via TOPICAL

## 2015-08-18 MED ORDER — MIDAZOLAM HCL 2 MG/2ML IJ SOLN
INTRAMUSCULAR | Status: AC
  Filled 2015-08-18: qty 4

## 2015-08-18 MED ORDER — CHLORHEXIDINE GLUCONATE 0.12 % MT SOLN
15.0000 mL | Freq: Two times a day (BID) | OROMUCOSAL | Status: DC
  Administered 2015-08-18 – 2015-08-23 (×11): 15 mL via OROMUCOSAL
  Filled 2015-08-18 (×7): qty 15

## 2015-08-18 MED ORDER — MIDAZOLAM HCL 5 MG/5ML IJ SOLN
INTRAMUSCULAR | Status: DC | PRN
  Administered 2015-08-18: 2 mg via INTRAVENOUS
  Administered 2015-08-18: 5 mg via INTRAVENOUS
  Administered 2015-08-18: 2 mg via INTRAVENOUS
  Administered 2015-08-18: 1 mg via INTRAVENOUS
  Administered 2015-08-18: 2 mg via INTRAVENOUS

## 2015-08-18 MED ORDER — FENTANYL CITRATE (PF) 100 MCG/2ML IJ SOLN
INTRAMUSCULAR | Status: DC | PRN
  Administered 2015-08-18 (×3): 250 ug via INTRAVENOUS
  Administered 2015-08-18: 200 ug via INTRAVENOUS
  Administered 2015-08-18: 50 ug via INTRAVENOUS

## 2015-08-18 MED ORDER — 0.9 % SODIUM CHLORIDE (POUR BTL) OPTIME
TOPICAL | Status: DC | PRN
  Administered 2015-08-18: 1000 mL
  Administered 2015-08-18: 5000 mL

## 2015-08-18 MED ORDER — LORAZEPAM 2 MG/ML IJ SOLN
0.5000 mg | INTRAMUSCULAR | Status: DC | PRN
  Administered 2015-08-19 – 2015-08-20 (×2): 0.5 mg via INTRAVENOUS
  Filled 2015-08-18 (×2): qty 1

## 2015-08-18 MED ORDER — ESMOLOL HCL 10 MG/ML IV SOLN
INTRAVENOUS | Status: DC | PRN
  Administered 2015-08-18: 40 mg via INTRAVENOUS

## 2015-08-18 MED ORDER — SODIUM CHLORIDE 0.9 % IJ SOLN
INTRAMUSCULAR | Status: AC
  Filled 2015-08-18: qty 20

## 2015-08-18 MED ORDER — ACETAMINOPHEN 160 MG/5ML PO SOLN: 1000.0000 mg | Freq: Four times a day (QID) | ORAL | Status: DC

## 2015-08-18 MED ORDER — POTASSIUM CHLORIDE 10 MEQ/50ML IV SOLN: 10.0000 meq | INTRAVENOUS | Status: AC

## 2015-08-18 MED ORDER — PANTOPRAZOLE SODIUM 40 MG PO TBEC
40.0000 mg | DELAYED_RELEASE_TABLET | Freq: Every day | ORAL | Status: DC
  Administered 2015-08-20 – 2015-08-23 (×4): 40 mg via ORAL
  Filled 2015-08-18 (×2): qty 1
  Filled 2015-08-18: qty 2
  Filled 2015-08-18: qty 1

## 2015-08-18 MED ORDER — DOPAMINE-DEXTROSE 3.2-5 MG/ML-% IV SOLN
3.0000 ug/kg/min | INTRAVENOUS | Status: DC
  Administered 2015-08-18: 5 ug/kg/min via INTRAVENOUS
  Administered 2015-08-20: 3 ug/kg/min via INTRAVENOUS
  Filled 2015-08-18: qty 250

## 2015-08-18 MED ORDER — VECURONIUM BROMIDE 10 MG IV SOLR
INTRAVENOUS | Status: AC
  Filled 2015-08-18: qty 10

## 2015-08-18 MED ORDER — DEXMEDETOMIDINE HCL IN NACL 200 MCG/50ML IV SOLN
0.0000 ug/kg/h | INTRAVENOUS | Status: DC
  Administered 2015-08-18: 0.6 ug/kg/h via INTRAVENOUS
  Administered 2015-08-18: 0.679 ug/kg/h via INTRAVENOUS
  Filled 2015-08-18: qty 50

## 2015-08-18 MED ORDER — VECURONIUM BROMIDE 10 MG IV SOLR
INTRAVENOUS | Status: DC | PRN
  Administered 2015-08-18: 3 mg via INTRAVENOUS
  Administered 2015-08-18: 5 mg via INTRAVENOUS
  Administered 2015-08-18: 2 mg via INTRAVENOUS

## 2015-08-18 MED ORDER — MAGNESIUM SULFATE 4 GM/100ML IV SOLN
4.0000 g | Freq: Once | INTRAVENOUS | Status: AC
Start: 2015-08-18 — End: 2015-08-18
  Administered 2015-08-18: 4 g via INTRAVENOUS
  Filled 2015-08-18: qty 100

## 2015-08-18 MED ORDER — HEPARIN SODIUM (PORCINE) 1000 UNIT/ML IJ SOLN
INTRAMUSCULAR | Status: AC
  Filled 2015-08-18: qty 1

## 2015-08-18 MED ORDER — SODIUM CHLORIDE 0.45 % IV SOLN
INTRAVENOUS | Status: DC | PRN
  Administered 2015-08-18: 14:00:00 via INTRAVENOUS

## 2015-08-18 MED ORDER — BISACODYL 10 MG RE SUPP: 10.0000 mg | Freq: Every day | RECTAL | Status: DC

## 2015-08-18 MED ORDER — ASPIRIN 81 MG PO CHEW: 324.0000 mg | CHEWABLE_TABLET | Freq: Every day | ORAL | Status: DC

## 2015-08-18 MED FILL — Heparin Sodium (Porcine) Inj 1000 Unit/ML: INTRAMUSCULAR | Qty: 30 | Status: AC

## 2015-08-18 MED FILL — Electrolyte-R (PH 7.4) Solution: INTRAVENOUS | Qty: 3000 | Status: AC

## 2015-08-18 MED FILL — Sodium Bicarbonate IV Soln 8.4%: INTRAVENOUS | Qty: 50 | Status: AC

## 2015-08-18 MED FILL — Mannitol IV Soln 20%: INTRAVENOUS | Qty: 500 | Status: AC

## 2015-08-18 MED FILL — Lidocaine HCl IV Inj 20 MG/ML: INTRAVENOUS | Qty: 5 | Status: AC

## 2015-08-18 MED FILL — Heparin Sodium (Porcine) Inj 1000 Unit/ML: INTRAMUSCULAR | Qty: 10 | Status: AC

## 2015-08-18 SURGICAL SUPPLY — 82 items
ADAPTER CARDIO PERF ANTE/RETRO (ADAPTER) ×3 IMPLANT
BAG DECANTER FOR FLEXI CONT (MISCELLANEOUS) ×3 IMPLANT
BANDAGE ELASTIC 4 VELCRO ST LF (GAUZE/BANDAGES/DRESSINGS) ×6 IMPLANT
BANDAGE ELASTIC 6 VELCRO ST LF (GAUZE/BANDAGES/DRESSINGS) ×6 IMPLANT
BASKET HEART (ORDER IN 25'S) (MISCELLANEOUS) ×1
BASKET HEART (ORDER IN 25S) (MISCELLANEOUS) ×2 IMPLANT
BENZOIN TINCTURE PRP APPL 2/3 (GAUZE/BANDAGES/DRESSINGS) ×6 IMPLANT
BLADE STERNUM SYSTEM 6 (BLADE) ×3 IMPLANT
BNDG GAUZE ELAST 4 BULKY (GAUZE/BANDAGES/DRESSINGS) ×6 IMPLANT
CANISTER SUCTION 2500CC (MISCELLANEOUS) ×3 IMPLANT
CANNULA EZ GLIDE AORTIC 21FR (CANNULA) ×3 IMPLANT
CANNULA GUNDRY RCSP 15FR (MISCELLANEOUS) ×3 IMPLANT
CATH CPB KIT HENDRICKSON (MISCELLANEOUS) ×3 IMPLANT
CATH ROBINSON RED A/P 18FR (CATHETERS) ×3 IMPLANT
CATH THORACIC 36FR (CATHETERS) ×3 IMPLANT
CATH THORACIC 36FR RT ANG (CATHETERS) ×3 IMPLANT
CLIP TI MEDIUM 24 (CLIP) IMPLANT
CLIP TI WIDE RED SMALL 24 (CLIP) ×6 IMPLANT
CRADLE DONUT ADULT HEAD (MISCELLANEOUS) ×3 IMPLANT
DRAIN CHANNEL 28F RND 3/8 FF (WOUND CARE) ×3 IMPLANT
DRAPE CARDIOVASCULAR INCISE (DRAPES) ×1
DRAPE SLUSH/WARMER DISC (DRAPES) ×3 IMPLANT
DRAPE SRG 135X102X78XABS (DRAPES) ×2 IMPLANT
DRSG COVADERM 4X14 (GAUZE/BANDAGES/DRESSINGS) ×3 IMPLANT
ELECT REM PT RETURN 9FT ADLT (ELECTROSURGICAL) ×6
ELECTRODE REM PT RTRN 9FT ADLT (ELECTROSURGICAL) ×4 IMPLANT
GAUZE SPONGE 4X4 12PLY STRL (GAUZE/BANDAGES/DRESSINGS) ×6 IMPLANT
GLOVE SURG SIGNA 7.5 PF LTX (GLOVE) ×9 IMPLANT
GOWN STRL REUS W/ TWL LRG LVL3 (GOWN DISPOSABLE) ×16 IMPLANT
GOWN STRL REUS W/ TWL XL LVL3 (GOWN DISPOSABLE) ×4 IMPLANT
GOWN STRL REUS W/TWL LRG LVL3 (GOWN DISPOSABLE) ×8
GOWN STRL REUS W/TWL XL LVL3 (GOWN DISPOSABLE) ×2
HEMOSTAT POWDER SURGIFOAM 1G (HEMOSTASIS) ×9 IMPLANT
HEMOSTAT SURGICEL 2X14 (HEMOSTASIS) ×3 IMPLANT
INSERT FOGARTY XLG (MISCELLANEOUS) IMPLANT
KIT BASIN OR (CUSTOM PROCEDURE TRAY) ×3 IMPLANT
KIT ROOM TURNOVER OR (KITS) ×3 IMPLANT
KIT SUCTION CATH 14FR (SUCTIONS) ×9 IMPLANT
KIT VASOVIEW W/TROCAR VH 2000 (KITS) ×3 IMPLANT
MARKER GRAFT CORONARY BYPASS (MISCELLANEOUS) ×9 IMPLANT
NS IRRIG 1000ML POUR BTL (IV SOLUTION) ×15 IMPLANT
PACK OPEN HEART (CUSTOM PROCEDURE TRAY) ×3 IMPLANT
PAD ARMBOARD 7.5X6 YLW CONV (MISCELLANEOUS) ×6 IMPLANT
PAD ELECT DEFIB RADIOL ZOLL (MISCELLANEOUS) ×3 IMPLANT
PENCIL BUTTON HOLSTER BLD 10FT (ELECTRODE) ×3 IMPLANT
PUNCH AORTIC ROT 4.0MM RCL 40 (MISCELLANEOUS) ×3 IMPLANT
PUNCH AORTIC ROTATE 4.0MM (MISCELLANEOUS) IMPLANT
PUNCH AORTIC ROTATE 4.5MM 8IN (MISCELLANEOUS) IMPLANT
PUNCH AORTIC ROTATE 5MM 8IN (MISCELLANEOUS) IMPLANT
SPONGE GAUZE 4X4 12PLY STER LF (GAUZE/BANDAGES/DRESSINGS) ×3 IMPLANT
STRIP CLOSURE SKIN 1/2X4 (GAUZE/BANDAGES/DRESSINGS) ×3 IMPLANT
SUT BONE WAX W31G (SUTURE) ×3 IMPLANT
SUT MNCRL AB 4-0 PS2 18 (SUTURE) IMPLANT
SUT PROLENE 3 0 SH DA (SUTURE) ×3 IMPLANT
SUT PROLENE 4 0 RB 1 (SUTURE) ×2
SUT PROLENE 4 0 SH DA (SUTURE) IMPLANT
SUT PROLENE 4-0 RB1 .5 CRCL 36 (SUTURE) ×4 IMPLANT
SUT PROLENE 5 0 C 1 36 (SUTURE) ×3 IMPLANT
SUT PROLENE 6 0 C 1 30 (SUTURE) ×6 IMPLANT
SUT PROLENE 7 0 BV1 MDA (SUTURE) ×6 IMPLANT
SUT PROLENE 8 0 BV175 6 (SUTURE) ×3 IMPLANT
SUT STEEL 6MS V (SUTURE) ×3 IMPLANT
SUT STEEL STERNAL CCS#1 18IN (SUTURE) IMPLANT
SUT STEEL SZ 6 DBL 3X14 BALL (SUTURE) ×3 IMPLANT
SUT VIC AB 1 CTX 36 (SUTURE) ×2
SUT VIC AB 1 CTX36XBRD ANBCTR (SUTURE) ×4 IMPLANT
SUT VIC AB 2-0 CT1 27 (SUTURE)
SUT VIC AB 2-0 CT1 TAPERPNT 27 (SUTURE) IMPLANT
SUT VIC AB 2-0 CTX 27 (SUTURE) IMPLANT
SUT VIC AB 3-0 SH 27 (SUTURE)
SUT VIC AB 3-0 SH 27X BRD (SUTURE) IMPLANT
SUT VIC AB 3-0 X1 27 (SUTURE) IMPLANT
SUT VICRYL 4-0 PS2 18IN ABS (SUTURE) IMPLANT
SUTURE E-PAK OPEN HEART (SUTURE) ×3 IMPLANT
SYSTEM SAHARA CHEST DRAIN ATS (WOUND CARE) ×3 IMPLANT
TOWEL OR 17X24 6PK STRL BLUE (TOWEL DISPOSABLE) ×6 IMPLANT
TOWEL OR 17X26 10 PK STRL BLUE (TOWEL DISPOSABLE) ×6 IMPLANT
TRAY FOLEY IC TEMP SENS 16FR (CATHETERS) ×3 IMPLANT
TUBE FEEDING 8FR 16IN STR KANG (MISCELLANEOUS) ×3 IMPLANT
TUBING INSUFFLATION (TUBING) ×3 IMPLANT
UNDERPAD 30X30 INCONTINENT (UNDERPADS AND DIAPERS) ×3 IMPLANT
WATER STERILE IRR 1000ML POUR (IV SOLUTION) ×6 IMPLANT

## 2015-08-18 NOTE — Interval H&P Note (Signed)
History and Physical Interval Note:  08/18/2015 7:17 AM  Dionne Ano  has presented today for surgery, with the diagnosis of SEVERE CAD LEFT MAIN DISEASE  The various methods of treatment have been discussed with the patient and family. After consideration of risks, benefits and other options for treatment, the patient has consented to  Procedure(s): CORONARY ARTERY BYPASS GRAFTING (CABG) (N/A) TRANSESOPHAGEAL ECHOCARDIOGRAM (TEE) (N/A) as a surgical intervention .  The patient's history has been reviewed, patient examined, no change in status, stable for surgery.  I have reviewed the patient's chart and labs.  Questions were answered to the patient's satisfaction.     Melrose Nakayama

## 2015-08-18 NOTE — Anesthesia Postprocedure Evaluation (Signed)
Anesthesia Post Note  Patient: Adam Shelton  Procedure(s) Performed: Procedure(s) (LRB): CORONARY ARTERY BYPASS GRAFTING (CABG)  X 5 UTILIZING THE LEFT INTERNAL MAMMARY ARTERY AND ENDOSCOPICALLY HARVESTED RIGHT AND LEFT SAPHENEOUS VEINS. (N/A) TRANSESOPHAGEAL ECHOCARDIOGRAM (TEE) (N/A)  Anesthesia type: General  Patient location: ICU  Post pain: Pain level controlled  Post assessment: Post-op Vital signs reviewed  Last Vitals: BP 101/67 mmHg  Pulse 121  Temp(Src) 36.3 C (Oral)  Resp 14  Ht 5\' 4"  (1.626 m)  Wt 161 lb 2.5 oz (73.1 kg)  BMI 27.65 kg/m2  SpO2 99%  Post vital signs: Reviewed  Level of consciousness: sedated/intubated  Complications: No apparent anesthesia complications

## 2015-08-18 NOTE — Anesthesia Procedure Notes (Addendum)
Procedure Name: Intubation Date/Time: 08/18/2015 7:54 AM Performed by: Willeen Cass P Pre-anesthesia Checklist: Patient identified, Emergency Drugs available, Suction available, Patient being monitored and Timeout performed Patient Re-evaluated:Patient Re-evaluated prior to inductionOxygen Delivery Method: Circle system utilized Preoxygenation: Pre-oxygenation with 100% oxygen Intubation Type: IV induction Ventilation: Mask ventilation without difficulty Laryngoscope Size: Mac and 3 Grade View: Grade I Tube type: Oral Tube size: 8.0 mm Number of attempts: 1 Airway Equipment and Method: Stylet Placement Confirmation: ETT inserted through vocal cords under direct vision,  positive ETCO2 and breath sounds checked- equal and bilateral Secured at: 23 cm Tube secured with: Tape Dental Injury: Teeth and Oropharynx as per pre-operative assessment  Comments: Intubation by Racheal Patches

## 2015-08-18 NOTE — Progress Notes (Signed)
  Echocardiogram Echocardiogram Transesophageal has been performed.  Jennette Dubin 08/18/2015, 9:13 AM

## 2015-08-18 NOTE — Plan of Care (Signed)
Called for family, not in lobby.

## 2015-08-18 NOTE — Progress Notes (Signed)
Dr. Roxan Hockey at bedside, ok to start rapid wean protocol.  Rowe Pavy, RN

## 2015-08-18 NOTE — Brief Op Note (Addendum)
08/14/2015 - 08/18/2015  11:44 AM  PATIENT:  Adam Shelton  64 y.o. male  PRE-OPERATIVE DIAGNOSIS:  SEVERE CAD, LEFT MAIN DISEASE  POST-OPERATIVE DIAGNOSIS:  SEVERE CAD, LEFT MAIN DISEASE  PROCEDURE:   CORONARY ARTERY BYPASS GRAFTING  X 5 (LIMA-LAD, SVG-OM2-OM3, SVG-Ramus 1-Ramus 2) ENDOSCOPIC GREATER SAPHENOUS VEIN HARVEST BILATERAL THIGHS  SURGEON:  Surgeon(s): Melrose Nakayama, MD  ASSISTANT: Suzzanne Cloud, PA-C  ANESTHESIA:   general  PATIENT CONDITION:  ICU - intubated and hemodynamically stable.  PRE-OPERATIVE WEIGHT: 75 kg   TEE- EF 35% apical akinesis, mild- moderate MR LIMA good, Saphenous vein satisfactory, targets good quality but all intramyocardial  XC= 97 min CPB= 131 min

## 2015-08-18 NOTE — Progress Notes (Signed)
Patient started on the rapid wean protocol of 40% and rate of 4, however, patients RR was only 6. Patient placed back on RR of 10. Will attempt again in 30 minutes.

## 2015-08-18 NOTE — OR Nursing (Signed)
1st call to SICU placed, confirmed Rm 11 for transfer.

## 2015-08-18 NOTE — Progress Notes (Signed)
Spoke with Dr. Darcey Nora about communication barrier with weaning, NIF -26/FV 500/gas read to Dr. Darcey Nora, MD orders ok to extubate and place patient on a venti mask 50%.  Rowe Pavy, RN

## 2015-08-18 NOTE — H&P (View-Only) (Signed)
Reason for Consult:Left main disease Referring Physician: Dr. Velora Mediate  Adam Shelton is an 64 y.o. male.  HPI: 64 yo man with known CAD presents with a cc/o chest pain.  Adam Shelton is a 64 yo man originally from [place]. He had an MI in 1995 treated with streptokinase. He has had a known totally occluded LAD. A nuclear study in 2012 showed anterior scar anteriorly, EF was 33%.  His history is significant for hypertension, hyperlipidemia, CAD, MI, obesity, hypothyroidism and arthritis. He had a CVA on 06/22/15 while flying home from [place]. He was treated with TPA and has minimal residual.  He has a 10 day history of substernal chest pain. These episodes having been occuring daily between 5 and 6 AM. He also has some angina early with exertion but it improves with a brief rest and he is then able to walk. He was started on Imdur, but continued to have pain. He came to the ED and his ECG showed lateral ST depression. His troponin was 0.57.  Today he underwent cardiac catheterization which revealed a totally occluded LAD which is well collateralized by the RCA. He also had a severe left main stenosis and significant disease in the circumflex. His EF was 35%.    Past Medical History  Diagnosis Date  . Hypertension   . Obesity   . Dyslipidemia   . Hypothyroidism   . Pulmonary valve disorders   . Mitral valve disorders   . Heart disease, unspecified   . Heart attack 09/24/94  . Arthritis   . Stroke   . H/O: CVA (cerebrovascular accident), 06/22/15 Rt frontal rec'd TPA 08/14/2015    Past Surgical History  Procedure Laterality Date  . Hernia repair      age 59  . Cardiac catheterization  12/10/94    Family History  Problem Relation Age of Onset  . Kidney disease Father     died age 2  . Heart disease Mother     had angioplasty    Social History:  reports that he has never smoked. He has never used smokeless tobacco. He reports that he does not drink alcohol or use  illicit drugs.  Allergies: No Known Allergies  Medications:  Prior to Admission:  Prescriptions prior to admission  Medication Sig Dispense Refill Last Dose  . aspirin 81 MG EC tablet Take 81 mg by mouth daily.     08/14/2015 at Unknown time  . atenolol (TENORMIN) 25 MG tablet Take 25 mg by mouth every other day.    08/14/2015 at 0630  . atenolol (TENORMIN) 50 MG tablet Take 50 mg by mouth every other day.    08/13/2015 at Unknown time  . atorvastatin (LIPITOR) 20 MG tablet Take 5 mg by mouth daily.    08/14/2015 at Unknown time  . clopidogrel (PLAVIX) 75 MG tablet Take 75 mg by mouth daily.   08/14/2015 at Unknown time  . folic acid (PX FOLIC ACID) 157 MCG tablet Take 400 mcg by mouth daily.     08/14/2015 at Unknown time  . isosorbide mononitrate (IMDUR) 30 MG 24 hr tablet Take 30 mg by mouth daily.   08/14/2015 at Unknown time  . levothyroxine (SYNTHROID, LEVOTHROID) 50 MCG tablet Take 50 mcg by mouth daily before breakfast. Take 1 tablet all days except Sat & Sun   08/14/2015 at Unknown time  . levothyroxine (SYNTHROID, LEVOTHROID) 75 MCG tablet Take 75 mcg by mouth daily before breakfast. Take 1 tablet only on Sat &  Sun   08/12/2015  . niacin 500 MG tablet Take 500 mg by mouth 3 (three) times a week.     08/14/2015 at Unknown time  . perindopril (ACEON) 4 MG tablet Take 2 mg by mouth daily.   08/14/2015 at Unknown time  . spironolactone (ALDACTONE) 25 MG tablet TAKE ONE TABLET BY MOUTH EVERY DAY 30 tablet 1 08/14/2015 at Unknown time  . vitamin B-12 (CYANOCOBALAMIN) 500 MCG tablet Take 500 mcg by mouth daily.   08/14/2015 at Unknown time  . vitamin E 400 UNIT capsule Take 400 Units by mouth daily.     08/14/2015 at Unknown time  . famotidine (PEPCID) 20 MG tablet Take 20 mg by mouth as needed.     Taking  . lisinopril (PRINIVIL,ZESTRIL) 10 MG tablet Take 1 tablet (10 mg total) by mouth daily. (Patient not taking: Reported on 08/14/2015) 30 tablet 6     Results for orders placed or performed during  the hospital encounter of 08/14/15 (from the past 48 hour(s))  I-stat troponin, ED     Status: Abnormal   Collection Time: 08/14/15 11:28 AM  Result Value Ref Range   Troponin i, poc 0.51 (HH) 0.00 - 0.08 ng/mL   Comment NOTIFIED PHYSICIAN    Comment 3            Comment: Due to the release kinetics of cTnI, a negative result within the first hours of the onset of symptoms does not rule out myocardial infarction with certainty. If myocardial infarction is still suspected, repeat the test at appropriate intervals.   Basic metabolic panel     Status: Abnormal   Collection Time: 08/14/15 11:38 AM  Result Value Ref Range   Sodium 134 (L) 135 - 145 mmol/L   Potassium 4.1 3.5 - 5.1 mmol/L   Chloride 102 101 - 111 mmol/L   CO2 24 22 - 32 mmol/L   Glucose, Bld 118 (H) 65 - 99 mg/dL   BUN 10 6 - 20 mg/dL   Creatinine, Ser 0.95 0.61 - 1.24 mg/dL   Calcium 9.5 8.9 - 10.3 mg/dL   GFR calc non Af Amer >60 >60 mL/min   GFR calc Af Amer >60 >60 mL/min    Comment: (NOTE) The eGFR has been calculated using the CKD EPI equation. This calculation has not been validated in all clinical situations. eGFR's persistently <60 mL/min signify possible Chronic Kidney Disease.    Anion gap 8 5 - 15  CBC     Status: None   Collection Time: 08/14/15 11:38 AM  Result Value Ref Range   WBC 7.0 4.0 - 10.5 K/uL   RBC 4.87 4.22 - 5.81 MIL/uL   Hemoglobin 13.3 13.0 - 17.0 g/dL   HCT 39.3 39.0 - 52.0 %   MCV 80.7 78.0 - 100.0 fL   MCH 27.3 26.0 - 34.0 pg   MCHC 33.8 30.0 - 36.0 g/dL   RDW 14.3 11.5 - 15.5 %   Platelets 223 150 - 400 K/uL  APTT     Status: None   Collection Time: 08/14/15 11:38 AM  Result Value Ref Range   aPTT 28 24 - 37 seconds  Protime-INR     Status: None   Collection Time: 08/14/15 11:38 AM  Result Value Ref Range   Prothrombin Time 14.5 11.6 - 15.2 seconds   INR 1.12 0.00 - 1.49  Troponin I     Status: Abnormal   Collection Time: 08/14/15 11:38 AM  Result Value Ref Range  Troponin I 0.57 (HH) <0.031 ng/mL    Comment:        POSSIBLE MYOCARDIAL ISCHEMIA. SERIAL TESTING RECOMMENDED. CRITICAL RESULT CALLED TO, READ BACK BY AND VERIFIED WITH: H.MORRISON,RN 08/14/15 1224 BY BSLADE   MRSA PCR Screening     Status: None   Collection Time: 08/14/15  4:20 PM  Result Value Ref Range   MRSA by PCR NEGATIVE NEGATIVE    Comment:        The GeneXpert MRSA Assay (FDA approved for NASAL specimens only), is one component of a comprehensive MRSA colonization surveillance program. It is not intended to diagnose MRSA infection nor to guide or monitor treatment for MRSA infections.   Platelet inhibition p2y12 (if on daily thienopyridine)  not at [facility]     Status: Abnormal   Collection Time: 08/14/15  5:10 PM  Result Value Ref Range   Platelet Function  P2Y12 146 (L) 194 - 418 PRU    Comment:        The literature has shown a direct correlation of PRU values over 230 with higher risks of thrombotic events.  Lower PRU values are associated with platelet inhibition.   Magnesium     Status: Abnormal   Collection Time: 08/14/15  6:51 PM  Result Value Ref Range   Magnesium 1.6 (L) 1.7 - 2.4 mg/dL  TSH     Status: None   Collection Time: 08/14/15  6:51 PM  Result Value Ref Range   TSH 3.386 0.350 - 4.500 uIU/mL  T4, free     Status: None   Collection Time: 08/14/15  6:51 PM  Result Value Ref Range   Free T4 1.12 0.61 - 1.12 ng/dL  Troponin I     Status: Abnormal   Collection Time: 08/14/15  6:51 PM  Result Value Ref Range   Troponin I 0.49 (H) <0.031 ng/mL    Comment:        PERSISTENTLY INCREASED TROPONIN VALUES IN THE RANGE OF 0.04-0.49 ng/mL CAN BE SEEN IN:       -UNSTABLE ANGINA       -CONGESTIVE HEART FAILURE       -MYOCARDITIS       -CHEST TRAUMA       -ARRYHTHMIAS       -LATE PRESENTING MYOCARDIAL INFARCTION       -COPD   CLINICAL FOLLOW-UP RECOMMENDED.     Dg Chest Portable 1 View  08/14/2015   CLINICAL DATA:  Chest pain and shortness of  breath for 2 days.  EXAM: PORTABLE CHEST - 1 VIEW  COMPARISON:  Single view of the chest 07/29/2015. PA and lateral chest 12/30/2013.  FINDINGS: The lungs are clear. Heart size is normal. No pneumothorax or pleural effusion. No focal bony abnormality.  IMPRESSION: Negative chest.   Electronically Signed   By: Inge Rise M.D.   On: 08/14/2015 11:31    Review of Systems  Constitutional: Positive for malaise/fatigue. Negative for fever and chills.  HENT: Negative for congestion.   Eyes: Negative for blurred vision and double vision.  Respiratory: Positive for shortness of breath. Negative for cough and wheezing.   Cardiovascular: Positive for chest pain. Negative for palpitations, orthopnea, claudication and leg swelling.  Gastrointestinal: Positive for heartburn. Negative for nausea and vomiting.  Genitourinary: Negative for dysuria and frequency.  Musculoskeletal: Positive for joint pain.  Neurological: Positive for focal weakness (left side face). Negative for dizziness, speech change, seizures, loss of consciousness and headaches.  Endo/Heme/Allergies: Bruises/bleeds easily.  All other  systems reviewed and are negative.  Blood pressure 148/84, pulse 69, temperature 97.6 F (36.4 C), temperature source Oral, resp. rate 14, height _0  (1.626 m), weight 150 lb 12.7 oz (68.4 kg), SpO2 100 %. Physical Exam  Vitals reviewed. Constitutional: He is oriented to person, place, and time. He appears well-developed and well-nourished. No distress.  HENT:  Head: Normocephalic and atraumatic.  Mouth/Throat: No oropharyngeal exudate.  Eyes: Conjunctivae and EOM are normal. No scleral icterus.  Neck: Neck supple. No thyromegaly present.  No carotid bruit  Cardiovascular: Normal rate, regular rhythm, normal heart sounds and intact distal pulses.  Exam reveals no gallop and no friction rub.   No murmur heard. Respiratory: Effort normal and breath sounds normal. He has no wheezes. He has no rales.   GI: Soft. He exhibits no distension. There is no tenderness.  Musculoskeletal: Normal range of motion. He exhibits no edema.  Neurological: He is alert and oriented to person, place, and time.  Very mild left facial droop, Motor 5/5 Both UE and LE  Skin: Skin is warm and dry.     CARDIAC CATHETERIZATION Conclusion    1. Prox LAD lesion, 100% stenosed. 2. LM lesion, 99% stenosed. 3. Mid Cx lesion, 90% stenosed. 4. There is moderate to severe left ventricular systolic dysfunction.  CONCLUSIONS: 1. Chronic total occlusion of the proximal LAD with right-to-left collaterals 2. Critical left main stenosis extending into the left circumflex and ramus intermedius 3. Patent RCA supplying collaterals to the LAD 4. Severe mid-circumflex stenosis 5. Moderate segmental LV systolic dysfunction with LVEF 35-40%  Plan:  TCTS evaluation for CABG  Tx pt to CCU  Start IV NTG and resume heparin when radial access site stable  Hold plavix and check P2Y12        Assessment/Plan: 64 yo man with multiple CRF and known CAD presents with an unstable coronary syndrome. He has severe left main disease and a chronically totally occluded LAD. CABG is indicated for survival benefit and relief of symptoms.   The complicating factors in his case are his recent stroke and plavix. He had a stroke in late June, about 7 weeks ago. Normally CABG would be delayed until 3 months post stroke but given the severity of his left main disease that is not an option. He has been on plavix. Ideally would wait 5-7 days off plavix but I do not think that is advisable for him. Will check P2Y12 to determine the level of effect.  I have discussed the general nature of the procedure, the need for general anesthesia, and the incisions to be used with the patient. I informed him of the expected hospital stay, overall recovery and short and long term outcomes. I reviewed the indications, risks, benefits and alternatives.  They understand the risks include, but are not limited to death, stroke, MI, DVT/PE, bleeding, possible need for transfusion, infections, and other organ system dysfunction including respiratory, renal, or GI complications. He accepts the risks and agrees to proceed.  He understands that he is at high risk for a perioperative CVA.  Adam Shelton 08/14/2015, 9:57 PM

## 2015-08-18 NOTE — OR Nursing (Signed)
RN contacted SICU with 2nd call.

## 2015-08-18 NOTE — Transfer of Care (Signed)
Immediate Anesthesia Transfer of Care Note  Patient: Adam Shelton  Procedure(s) Performed: Procedure(s): CORONARY ARTERY BYPASS GRAFTING (CABG)  X 5 UTILIZING THE LEFT INTERNAL MAMMARY ARTERY AND ENDOSCOPICALLY HARVESTED RIGHT AND LEFT SAPHENEOUS VEINS. (N/A) TRANSESOPHAGEAL ECHOCARDIOGRAM (TEE) (N/A)  Patient Location: ICU  Anesthesia Type:General  Level of Consciousness: sedated and Patient remains intubated per anesthesia plan  Airway & Oxygen Therapy: Patient remains intubated per anesthesia plan and Patient placed on Ventilator (see vital sign flow sheet for setting)  Post-op Assessment: Report given to RN and Post -op Vital signs reviewed and stable  Post vital signs: Reviewed and stable  Last Vitals:  Filed Vitals:   08/18/15 1325  BP: 101/67  Pulse: 106  Temp:   Resp: 13    Complications: No apparent anesthesia complications

## 2015-08-18 NOTE — Progress Notes (Signed)
CT surgery p.m. Rounds  Status post multivessel CABG Patient extubated with stable hemodynamics Cardiac index now 2.2 Stable postop course

## 2015-08-18 NOTE — Procedures (Signed)
Extubation Procedure Note  Patient Details:   Name: Adam Shelton DOB: August 19, 1951 MRN: [id number]   Airway Documentation:     Evaluation  O2 sats: stable throughout Complications: No apparent complications Patient did tolerate procedure well. Bilateral Breath Sounds: Clear Suctioning: Airway Yes  Patient tolerated rapid wean. NIF -26 and VC 0.5L (MD notified of language barrier, Poor patient effort of VC) Ordered to extubate. Positive for cuff leak. Patient extubated to a 50% VM. No signs of dyspnea or stridor. Patient achieved 250 mL on Incentive Spirometer, with poor patient effort. Patient not understanding to take a deep breath. RN aware. Will continue to monitor.   Myrtie Neither 08/18/2015, 6:57 PM

## 2015-08-18 NOTE — Progress Notes (Signed)
Attempt made to switch patient to CPAP; unable to switch to CPAP due to respirations being 5.  Rowe Pavy, RN

## 2015-08-19 ENCOUNTER — Inpatient Hospital Stay (HOSPITAL_COMMUNITY): Payer: BLUE CROSS/BLUE SHIELD

## 2015-08-19 LAB — POCT I-STAT, CHEM 8
BUN: 13 mg/dL (ref 6–20)
CALCIUM ION: 1.2 mmol/L (ref 1.13–1.30)
CREATININE: 0.9 mg/dL (ref 0.61–1.24)
Chloride: 96 mmol/L — ABNORMAL LOW (ref 101–111)
GLUCOSE: 123 mg/dL — AB (ref 65–99)
HCT: 32 % — ABNORMAL LOW (ref 39.0–52.0)
HEMOGLOBIN: 10.9 g/dL — AB (ref 13.0–17.0)
Potassium: 4 mmol/L (ref 3.5–5.1)
Sodium: 136 mmol/L (ref 135–145)
TCO2: 26 mmol/L (ref 0–100)

## 2015-08-19 LAB — CBC
HCT: 32.2 % — ABNORMAL LOW (ref 39.0–52.0)
HEMATOCRIT: 31.6 % — AB (ref 39.0–52.0)
HEMOGLOBIN: 10.8 g/dL — AB (ref 13.0–17.0)
HEMOGLOBIN: 10.5 g/dL — AB (ref 13.0–17.0)
MCH: 27.1 pg (ref 26.0–34.0)
MCH: 27.6 pg (ref 26.0–34.0)
MCHC: 33.5 g/dL (ref 30.0–36.0)
MCHC: 33.2 g/dL (ref 30.0–36.0)
MCV: 80.7 fL (ref 78.0–100.0)
MCV: 82.9 fL (ref 78.0–100.0)
PLATELETS: 151 10*3/uL (ref 150–400)
Platelets: 136 10*3/uL — ABNORMAL LOW (ref 150–400)
RBC: 3.99 MIL/uL — AB (ref 4.22–5.81)
RBC: 3.81 MIL/uL — AB (ref 4.22–5.81)
RDW: 14.6 % (ref 11.5–15.5)
RDW: 14.9 % (ref 11.5–15.5)
WBC: 11.9 10*3/uL — AB (ref 4.0–10.5)
WBC: 11.5 10*3/uL — AB (ref 4.0–10.5)

## 2015-08-19 LAB — CREATININE, SERUM
Creatinine, Ser: 0.91 mg/dL (ref 0.61–1.24)
GFR calc non Af Amer: >60 mL/min (ref >60)

## 2015-08-19 LAB — GLUCOSE, CAPILLARY
GLUCOSE-CAPILLARY: 130 mg/dL — AB (ref 65–99)
GLUCOSE-CAPILLARY: 144 mg/dL — AB (ref 65–99)
GLUCOSE-CAPILLARY: 152 mg/dL — AB (ref 65–99)
GLUCOSE-CAPILLARY: 176 mg/dL — AB (ref 65–99)
Glucose-Capillary: 129 mg/dL — ABNORMAL HIGH (ref 65–99)
Glucose-Capillary: 126 mg/dL — ABNORMAL HIGH (ref 65–99)

## 2015-08-19 LAB — BASIC METABOLIC PANEL
Anion gap: 6 (ref 5–15)
BUN: 9 mg/dL (ref 6–20)
CHLORIDE: 104 mmol/L (ref 101–111)
CO2: 25 mmol/L (ref 22–32)
Calcium: 8.2 mg/dL — ABNORMAL LOW (ref 8.9–10.3)
Creatinine, Ser: 0.86 mg/dL (ref 0.61–1.24)
Glucose, Bld: 154 mg/dL — ABNORMAL HIGH (ref 65–99)
POTASSIUM: 4.1 mmol/L (ref 3.5–5.1)
SODIUM: 135 mmol/L (ref 135–145)

## 2015-08-19 LAB — MAGNESIUM
MAGNESIUM: 2 mg/dL (ref 1.7–2.4)
Magnesium: 1.9 mg/dL (ref 1.7–2.4)

## 2015-08-19 MED ORDER — METOCLOPRAMIDE HCL 5 MG/ML IJ SOLN
10.0000 mg | Freq: Four times a day (QID) | INTRAMUSCULAR | Status: DC
  Administered 2015-08-19 – 2015-08-21 (×8): 10 mg via INTRAVENOUS
  Filled 2015-08-19 (×13): qty 2

## 2015-08-19 MED ORDER — FUROSEMIDE 10 MG/ML IJ SOLN
20.0000 mg | Freq: Two times a day (BID) | INTRAMUSCULAR | Status: DC
  Administered 2015-08-19 – 2015-08-20 (×2): 20 mg via INTRAVENOUS
  Filled 2015-08-19 (×4): qty 2

## 2015-08-19 MED ORDER — INSULIN ASPART 100 UNIT/ML ~~LOC~~ SOLN
0.0000 [IU] | SUBCUTANEOUS | Status: DC
  Administered 2015-08-19 (×3): 2 [IU] via SUBCUTANEOUS

## 2015-08-19 NOTE — Progress Notes (Signed)
CT surgery p.m. Rounds  Patient examined and record reviewed.Hemodynamics stable,labs satisfactory.Patient had stable day.Continue current care. Adam Shelton 08/19/2015

## 2015-08-19 NOTE — Progress Notes (Signed)
1 Day Post-Op Procedure(s) (LRB): CORONARY ARTERY BYPASS GRAFTING (CABG)  X 5 UTILIZING THE LEFT INTERNAL MAMMARY ARTERY AND ENDOSCOPICALLY HARVESTED RIGHT AND LEFT SAPHENEOUS VEINS. (N/A) TRANSESOPHAGEAL ECHOCARDIOGRAM (TEE) (N/A) Subjective: nsr Gastric dilatation  Labs ok  Objective: Vital signs in last 24 hours: Temp:  [97.2 F (36.2 C)-99.5 F (37.5 C)] 98.2 F (36.8 C) (08/20 0700) Pulse Rate:  [80-121] 101 (08/20 0700) Cardiac Rhythm:  [-] Normal sinus rhythm (08/19 2000) Resp:  [10-33] 33 (08/20 0700) BP: (89-111)/(59-80) 109/63 mmHg (08/20 0700) SpO2:  [93 %-100 %] 93 % (08/20 0700) Arterial Line BP: (89-129)/(54-80) 95/60 mmHg (08/20 0700) FiO2 (%):  [35 %-50 %] 35 % (08/20 0100) Weight:  [158 lb 11.7 oz (72 kg)] 158 lb 11.7 oz (72 kg) (08/20 0500)  Hemodynamic parameters for last 24 hours: PAP: (27-50)/(14-28) 49/25 mmHg CO:  [2.4 L/min-4.6 L/min] 3.5 L/min CI:  [1.3 L/min/m2-2.6 L/min/m2] 2 L/min/m2  Intake/Output from previous day: 08/19 0701 - 08/20 0700 In: 7010.5 [P.O.:580; I.V.:4556.5; Blood:474; IV Piggyback:1400] Out: 0932 [IZTIW:5809; Chest Tube:450] Intake/Output this shift:    Neuro intact abd non tender Lab Results:  Recent Labs  08/18/15 2000 08/18/15 2012 08/19/15 0400  WBC 12.9*  --  11.9*  HGB 11.3* 11.9* 10.8*  HCT 33.7* 35.0* 32.2*  PLT 173  --  151   BMET:  Recent Labs  08/17/15 2100  08/18/15 2012 08/19/15 0400  NA 135  < > 137 135  K 4.1  < > 4.3 4.1  CL 103  < > 102 104  CO2 22  --   --  25  GLUCOSE 116*  < > 160* 154*  BUN 15  < > 10 9  CREATININE 1.00  < > 0.80 0.86  CALCIUM 9.5  --   --  8.2*  < > = values in this interval not displayed.  PT/INR:  Recent Labs  08/18/15 1330  LABPROT 18.1*  INR 1.49   ABG    Component Value Date/Time   PHART 7.371 08/18/2015 2008   HCO3 23.5 08/18/2015 2008   TCO2 23 08/18/2015 2012   ACIDBASEDEF 2.0 08/18/2015 2008   O2SAT 93.0 08/18/2015 2008   CBG (last 3)   Recent  Labs  08/19/15 0033 08/19/15 0432 08/19/15 0819  GLUCAP 176* 152* 129*    Assessment/Plan: S/P Procedure(s) (LRB): CORONARY ARTERY BYPASS GRAFTING (CABG)  X 5 UTILIZING THE LEFT INTERNAL MAMMARY ARTERY AND ENDOSCOPICALLY HARVESTED RIGHT AND LEFT SAPHENEOUS VEINS. (N/A) TRANSESOPHAGEAL ECHOCARDIOGRAM (TEE) (N/A) Mobilize Diuresis Diabetes control d/c tubes/lines reglan , clear liqs for gastric dilatation   LOS: 5 days    Tharon Aquas Trigt III 08/19/2015

## 2015-08-19 NOTE — Op Note (Signed)
[name], Shelton                ACCOUNT NO.:  [id number]  MEDICAL RECORD NO.:  [id number]  LOCATION:                               FACILITY:  [facility]  PHYSICIAN:  Revonda Standard. Roxan Hockey, M.D.DATE OF BIRTH:  02-15-1951  DATE OF PROCEDURE:  08/18/2015 DATE OF DISCHARGE:  08/23/2015                              OPERATIVE REPORT   PREOPERATIVE DIAGNOSIS:  Severe left main and two-vessel coronary artery disease.  POSTOPERATIVE DIAGNOSIS:  Severe left main and two-vessel coronary artery disease.  PROCEDURE:   Median sternotomy, extracorporeal circulation Coronary artery bypass grafting x 5   Left internal mammary artery to left anterior descending  Sequential saphenous vein graft to ramus intermedius 1 and 2  Sequential saphenous vein graft to obtuse marginals 2 and 3 Endoscopic vein harvest both thighs  SURGEON:  Remo Lipps C. Roxan Hockey, M.D.  ASSISTANT:  Suzzanne Cloud, P.A.  ANESTHESIA:  General.  FINDINGS:  Transesophageal echocardiography prebypass showed mild-to- moderate mitral regurgitation.  Ejection fraction was approximately 35% with apical akinesis.  The saphenous vein was satisfactory but became small in the lower portion of the right thigh, therefore additional vein harvested from left thigh.  Mammary was good quality.  The targets were good quality but all the vessels were intra myocardial.  CLINICAL NOTE:  [name] is a 64 year old man with a longstanding history of coronary artery disease but no recent problems.  He presented with crescendo unstable angina.  On cardiac catheterization, he was found to have a totally occluded LAD and severe left main stenosis along with significant disease in the circumflex.  His right coronary was normal.  Ejection fraction was 35% with anterior apical hypokinesis. The patient had a recent stroke approximately 6 weeks ago, but given the severity of his coronary artery disease and continued chest pain, he was advised to undergo  coronary artery bypass grafting.  The indications, risks, benefits, and alternatives were discussed in detail with the patient.  He understood and accepted the risks and agreed to proceed. He had been on Plavix prior to admission.  Surgery was scheduled for August 18, 2015, and to compromise between allowing Plavix washout without unduly delaying his surgery.  The indications, risks, benefits, and alternatives were discussed in detail with the patient.  He understood and accepted those risks and agreed to proceed.  OPERATIVE NOTE:  Mr. Cork was brought to the preoperative holding area on August 18, 2015.  Anesthesia placed a Swan-Ganz catheter and arterial blood pressure monitoring line.  He was taken to the operating room, anesthetized, and intubated.  Intravenous antibiotics were administered. A Foley catheter was placed.  The chest, abdomen, and legs were prepped and draped in usual sterile fashion.  A transesophageal echocardiography probe was placed and the findings were as noted.  Initially, there appeared to be significant mitral regurgitation.  Upon further inspection, the effective regurgitant orifice area was less than 0.2 cm2, and the regurgitant jet was mild.  A median sternotomy was performed and the left internal mammary artery was harvested using standard technique.  Simultaneously, an incision was made in the medial aspect of the right leg at the level of the knee. The greater saphenous vein was  harvested from the right thigh endoscopically.  The vein in the lower portion of the thigh was very small and was not felt suitable for use as a bypass graft.  Additional vein was harvested from the left thigh endoscopically.  5000 units of heparin was administered during the vessel harvest.  The remainder of full heparin dose was given prior to opening the pericardium.  The pericardium was opened.  The ascending aorta was inspected.  While waiting for the ACT to become  therapeutic, the patient became hypotensive and had elevated pulmonary artery pressures.  He became bradycardic.  The heart became distended.  Fortunately, by this point, the ACT was therapeutic and the aorta was cannulated via concentric 2-0 Ethibond pledgeted pursestring sutures.  A dual-stage venous cannula was placed via pursestring suture in the right atrial appendage. Cardiopulmonary bypass was initiated.  Flows were maintained per protocol, and the patient was cooled to 32 degrees Celsius.  The coronary arteries were inspected.  All the coronaries were intramyocardial and were dissected out after the crossclamp was in place.  The conduits were inspected and prepared.  A foam pad was placed in the pericardium to insulate the heart.  A temperature probe was placed in the myocardial septum.  A retrograde cardioplegia cannula was placed via pursestring suture in the right atrium and directed in the coronary sinus.  An antegrade cardioplegia cannula was placed in the ascending aorta.  The aorta was crossclamped.  The left ventricle was emptied via the aortic root vent.  Cardiac arrest then was achieved with a combination of cold antegrade and retrograde blood cardioplegia. Initially 500 mL of cardioplegia was administered antegrade, there was a rapid diastolic arrest but relatively sluggish septal cooling.  An additional 500 mL was given retrograde and there was septal cooling to 9 degrees Celsius.  A reversed saphenous vein graft then was placed end-to-side to obtuse marginals 2 and 3.  These were the dominant lateral wall branches.  They were both 1.5 mm target vessels, both were of good quality, both were intramyocardial.  A side-to-side anastomosis was performed to OM2 and an end-to-side to OM3.  Both were done with running 7-0 Prolene sutures. All anastomoses were probed distally at their completion to ensure patency.  Cardioplegia was administered down the graft, and there  was good flow and good hemostasis.  Additional cardioplegia was administered via the retrograde cannula as Well. While this was being done, the distal LAD was dissected out.  It was a deeply intramyocardial vessel.  The heart was again elevated and the 2 anterolateral branches which appeared to be 2 branches of the ramus intermedius were grafted.  Again, a sequential vein graft was used with a side-to-side to the ramus intermedius 1 and end-to-side ramus intermedius 2.  Both anastomoses were done with running 7-0 Prolene sutures and a probe passed easily distally in each of the vessels.  At the completion of the respective anastomoses, cardioplegia was administered down this graft.  There was some bleeding from the myocardium but good hemostasis at the anastomoses.  Next, the left internal mammary artery was brought through a window in the pericardium.  The distal end was beveled.  It was a 1.5 mm vessel with good flow.  It was anastomosed end-to-side to the distal LAD just beyond the tight mid LAD stenosis.  The LAD was a 1.5 mm vessel at that site.  The mammary was anastomosed end-to-side with a running 8-0 Prolene suture.  At the completion of the mammary to  LAD anastomosis, the bulldog clamp was removed.  Septal rewarming was noted.  The bulldog clamp was replaced.  The mammary pedicle was tacked to the epicardial surface of the heart with 6-0 Prolene sutures.  Additional cardioplegia was administered via the retrograde cannula. The vein grafts were cut to length.  The cardioplegia cannula was removed from the ascending aorta.  The proximal vein graft anastomoses were performed to 4.0 mm punch aortotomies with running 6-0 Prolene sutures.  At the completion of final proximal anastomosis, the patient was placed in Trendelenburg position.  Lidocaine was administered.  A warm dose of 500 mL of cardioplegia was administered via the retrograde cannula.  At the completion of this, the  bulldog clamp was removed from the left mammary artery.  After de-airing the heart and aortic root, the aortic crossclamp was removed.  The total crossclamp time was 97 minutes.    While the patient was being rewarmed, all proximal and distal anastomoses were inspected for hemostasis.  Epicardial pacing wires were placed on the right ventricle and right atrium.  The patient was initially bradycardic and was DDD paced. However, after initiating a dopamine infusion at 5 mcg/kg/minute, the patient became mildly tachycardic and the pacing was discontinued.  When the patient had rewarmed to a core temperature of 37 degrees Celsius, he was weaned from cardiopulmonary bypass on the first attempt. He was in sinus tachycardia and on 5 mcg/kg/minute of dopamine.  The initial cardiac index was greater than 2 L/min/m2.  The dopamine was decreased to 3 mcg/kg/minute as the patient was tachycardiac and this did improve the tachycardia. Postbypass transesophageal echocardiography revealed improved wall motion and mild mitral regurgitation.  A test dose of protamine was administered and was well tolerated.  The atrial and aortic cannulae were removed.  The remainder of the protamine was administered without incident.  The chest was irrigated with warm saline.  Hemostasis was achieved.  The pericardium was reapproximated over the ascending aorta and base of the heart with interrupted 3-0 silk sutures.  A left pleural and 2 mediastinal chest tubes were placed through separate subcostal incisions.  The sternum was closed with interrupted heavy gauge stainless steel wires.  The pectoralis fascia, subcutaneous tissue, and skin were closed in standard fashion.  All sponge, needle, and instrument counts were correct at the end of the procedure.  The patient remained hemodynamically stable, was taken from the operating room to the surgical intensive care unit in good condition.     Revonda Standard Roxan Hockey,  M.D.     SCH/MEDQ  D:  08/18/2015  T:  08/19/2015  Job:  [id number]

## 2015-08-19 NOTE — Op Note (Deleted)
NAMEKAULANA, BRINDLE                ACCOUNT NO.:  [id number]  MEDICAL RECORD NO.:  [id number]  LOCATION:                               FACILITY:  [facility]  PHYSICIAN:  Revonda Standard. Roxan Hockey, M.D.DATE OF BIRTH:  1951/01/31  DATE OF PROCEDURE:  08/18/2015 DATE OF DISCHARGE:  08/23/2015                              OPERATIVE REPORT   PREOPERATIVE DIAGNOSIS:  Severe left main and two-vessel coronary artery disease.  POSTOPERATIVE DIAGNOSIS:  Severe left main and two-vessel coronary artery disease.  PROCEDURE:  Median sternotomy, extracorporeal circulation, coronary artery bypass grafting x5 (left internal mammary artery to left anterior descending, sequential saphenous vein graft to ramus intermedius 1 and 2, saphenous vein graft to obtuse marginals 2 and 3, endoscopic vein harvest, both thighs).  SURGEON:  Revonda Standard. Roxan Hockey, M.D.  ASSISTANT:  Suzzanne Cloud, P.A.  ANESTHESIA:  General.  FINDINGS:  Transesophageal echocardiography prebypass showed mild-to- moderate mitral regurgitation.  Ejection fraction was approximately 35% with apical akinesis.  The saphenous vein was satisfactory but became small in the lower portion of the right thigh, therefore additional vein harvested from left thigh.  Mammary was good quality.  The targets were good quality but all the vessels were intra myocardial.  CLINICAL NOTE:  Mr. Gerry is a 64 year old man with a longstanding history of coronary artery disease but no recent problems.  He presented with crescendo unstable angina.  On cardiac catheterization, he was found to have a totally occluded LAD and severe left main stenosis along with significant disease in the circumflex.  His right coronary was normal.  Ejection fraction was 35% with anterior apical hypokinesis. The patient had a recent stroke approximately 6 weeks ago, but given the severity of his coronary artery disease and continued chest pain, he was advised to undergo coronary  artery bypass grafting.  The indications, risks, benefits, and alternatives were discussed in detail with the patient.  He understood and accepted the risks and agreed to proceed. He had been on Plavix prior to admission.  Surgery was scheduled for August 18, 2015, and to compromise between allowing Plavix washout unduly delaying his surgery.  The indications, risks, benefits, and alternatives were discussed in detail with the patient.  He understood and accepted those risks and agreed to proceed.  OPERATIVE NOTE:  Mr. Raczka was brought to the preoperative holding area on August 18, 2015.  Anesthesia placed a Swan-Ganz catheter and arterial blood pressure monitoring line.  He was taken to the operating room, anesthetized, and intubated.  Intravenous antibiotics were administered. A Foley catheter was placed.  The chest, abdomen, and legs were prepped and draped in usual sterile fashion.  A transesophageal echocardiography probe was placed and the findings were as noted.  Initially, there appeared to be significant mitral regurgitation.  Upon further inspection, the effective regurgitant orifice area was less than 0.2 cm2, and the regurgitant jet was mild.  A median sternotomy was performed and the left internal mammary artery was harvested using standard technique.  Simultaneously, an incision was made in the medial aspect of the right leg at the level of the knee. The greater saphenous vein was harvested from the right thigh endoscopically.  The  vein in the lower portion of the thigh was very small and was not felt suitable for use as a bypass graft.  Additional vein was harvested from the left thigh endoscopically.  5000 units of heparin was administered during the vessel harvest.  The remainder of full heparin dose was given prior to opening the pericardium.  The pericardium was opened.  The ascending aorta was inspected.  While waiting for the ACT to become therapeutic, the  patient became hypotensive and had elevated pulmonary artery pressures.  He became bradycardic.  The heart became distended.  Fortunately, by this point, the ACT was therapeutic and the aorta was cannulated via concentric 2-0 Ethibond pledgeted pursestring sutures.  A dual-stage venous cannula was placed via pursestring suture in the right atrial appendage. Cardiopulmonary bypass was initiated.  Flows were maintained per protocol, and the patient was cooled to 32 degrees Celsius.  The coronary arteries were inspected.  All the coronaries were intramyocardial and were dissected out primarily after the crossclamp was in place.  The conduits were inspected and prepared.  A foam pad was placed in the pericardium to insulate the heart.  A temperature probe was placed in myocardial septum.  Retrograde cardioplegic cannula was placed via pursestring suture in the right atrium and directed in the coronary sinus.  An antegrade cardioplegic cannula was placed in the ascending aorta.  The aorta was crossclamped.  The left ventricle was emptied via the aortic root vent.  Cardiac arrest then was achieved with a combination of cold antegrade and retrograde blood cardioplegia. Initial 500 mL of cardioplegia was administered antegrade, there was a rapid diastolic arrest and relatively sluggish septal cooling.  An additional 500 mL was given retrograde and there was septal cooling to 9 degrees Celsius.  A reverse saphenous vein graft then was placed end-to-side to obtuse marginals 2 and 3.  These with a dominant lateral wall branches.  They were both 1.5 mm target vessels, both were of good quality, both were intramyocardial.  A side-to-side anastomosis was performed to OM2 and an end-to-side to OM3.  Both were done with running 7-0 Prolene sutures. All anastomoses were probed distally at their completion to ensure patency.  Cardioplegia was administered down the graft, and there was good flow and good  hemostasis.  Additional cardioplegia was administered to the retrograde cannula as well, this being done.  The distal LAD was dissected out.  This was a deeply intramyocardial vessel.  The heart was again elevated and the 2 anterior lateral branches which appeared to be 2 branches of the ramus intermedius were grafted.  Again, a sequential vein graft was used with a side-to-side to the ramus intermedius 1 and end-to-side ramus intermedius 2.  Both anastomoses were done with running 7-0 Prolene sutures and a probe passed easily distally in each of the vessels.  At the completion of the respective anastomoses, cardioplegia was administered down this graft.  There was some bleeding from the myocardium but good hemostasis at the anastomoses.  Next, the left internal mammary artery was brought through a window in the pericardium.  The distal end was beveled.  It was a 1.5 mm vessel with good flow.  It was anastomosed end-to-side to the distal LAD just beyond the tight mid LAD stenosis.  There was a 1.5 mm vessel at that site.  The mammary was anastomosed end-to-side with a running 8-0 Prolene suture.  At the completion of the mammary to LAD anastomosis, the bulldog clamp was removed.  Septal rewarming  was noted.  The bulldog clamp was replaced.  The mammary pedicle was tacked to the epicardial surface of the heart with 6-0 Prolene sutures.  Additional cardioplegia was administered via the retrograde cannula. The vein grafts were cut to length.  The cardioplegia cannula was removed from the ascending aorta.  The proximal vein graft anastomoses were performed to 4.0 mm punch aortotomies with running 6-0 Prolene sutures.  At the completion of final proximal anastomosis, the patient was placed in Trendelenburg position.  Lidocaine was administered.  A warm dose of 500 mL of cardioplegia was administered via the retrograde cannula.  At the completion of this, the bulldog clamp was removed  from the left mammary artery.  After de-airing the heart and aortic root, the aortic crossclamp was removed.  The total crossclamp time was 97 minutes.  While the patient was being rewarmed, all proximal and distal anastomoses were inspected for hemostasis.  Epicardial pacing wires were placed on the right ventricle and right atrium.  The patient was initially bradycardic and was DDD paced initially, however, after initiating a dopamine infusion at 5 mcg/kg/minute, the patient became mildly tachycardic and the pacing was discontinued.  When the patient had rewarmed to a core temperature of 37 degrees Celsius, he was weaned from cardiopulmonary bypass on the first attempt and sinus tachycardia and on 5 mcg/kg/minute of dopamine.  The initial cardiac index was greater than 2 L/min/m2.  The dopamine was decreased to 3 mcg/kg/minute as the patient was tachycardiac and this did improve the tachycardia. Postbypass transesophageal echocardiography revealed improved wall motion and mild mitral regurgitation.  A test dose of protamine was administered and was well tolerated.  The atrial and aortic cannulae were removed.  The remainder of the protamine was administered without incident.  The chest was irrigated with warm saline.  Hemostasis was achieved.  The pericardium was reapproximated over the ascending aorta and base of the heart with interrupted 3-0 silk sutures.  A left pleural and 2 mediastinal chest tubes were placed through separate subcostal incisions.  The sternum was closed with interrupted heavy gauge stainless steel wires.  The pectoralis fascia, subcutaneous tissue, and skin were closed in standard fashion.  All sponge, needle, and instrument counts were correct at the end of the procedure.  The patient remained hemodynamically stable, was taken from the operating room to the surgical intensive care unit in good condition.     Revonda Standard Roxan Hockey, M.D.     SCH/MEDQ  D:   08/18/2015  T:  08/19/2015  Job:  [id number]

## 2015-08-20 ENCOUNTER — Encounter (HOSPITAL_COMMUNITY): Payer: Self-pay | Admitting: Thoracic Surgery (Cardiothoracic Vascular Surgery)

## 2015-08-20 ENCOUNTER — Inpatient Hospital Stay (HOSPITAL_COMMUNITY): Payer: BLUE CROSS/BLUE SHIELD

## 2015-08-20 LAB — BASIC METABOLIC PANEL
Anion gap: 7 (ref 5–15)
BUN: 12 mg/dL (ref 6–20)
CO2: 29 mmol/L (ref 22–32)
Calcium: 8.5 mg/dL — ABNORMAL LOW (ref 8.9–10.3)
Chloride: 98 mmol/L — ABNORMAL LOW (ref 101–111)
Creatinine, Ser: 0.89 mg/dL (ref 0.61–1.24)
GFR calc Af Amer: >60 mL/min (ref >60)
GFR calc non Af Amer: >60 mL/min (ref >60)
Glucose, Bld: 110 mg/dL — ABNORMAL HIGH (ref 65–99)
Potassium: 3.8 mmol/L (ref 3.5–5.1)
Sodium: 134 mmol/L — ABNORMAL LOW (ref 135–145)

## 2015-08-20 LAB — CBC
HCT: 28.8 % — ABNORMAL LOW (ref 39.0–52.0)
Hemoglobin: 9.5 g/dL — ABNORMAL LOW (ref 13.0–17.0)
MCH: 27.1 pg (ref 26.0–34.0)
MCHC: 33 g/dL (ref 30.0–36.0)
MCV: 82.1 fL (ref 78.0–100.0)
Platelets: 122 10*3/uL — ABNORMAL LOW (ref 150–400)
RBC: 3.51 MIL/uL — ABNORMAL LOW (ref 4.22–5.81)
RDW: 14.6 % (ref 11.5–15.5)
WBC: 11.1 10*3/uL — ABNORMAL HIGH (ref 4.0–10.5)

## 2015-08-20 LAB — GLUCOSE, CAPILLARY
GLUCOSE-CAPILLARY: 111 mg/dL — AB (ref 65–99)
Glucose-Capillary: 103 mg/dL — ABNORMAL HIGH (ref 65–99)
Glucose-Capillary: 111 mg/dL — ABNORMAL HIGH (ref 65–99)
Glucose-Capillary: 113 mg/dL — ABNORMAL HIGH (ref 65–99)

## 2015-08-20 MED ORDER — SODIUM CHLORIDE 0.9 % IJ SOLN: 3.0000 mL | INTRAMUSCULAR | Status: DC | PRN

## 2015-08-20 MED ORDER — POTASSIUM CHLORIDE ER 10 MEQ PO TBCR
20.0000 meq | EXTENDED_RELEASE_TABLET | Freq: Every day | ORAL | Status: DC
  Administered 2015-08-21 – 2015-08-22 (×2): 20 meq via ORAL
  Filled 2015-08-20 (×7): qty 2

## 2015-08-20 MED ORDER — ALPRAZOLAM 0.5 MG PO TABS
0.5000 mg | ORAL_TABLET | Freq: Two times a day (BID) | ORAL | Status: DC | PRN
  Administered 2015-08-20 (×2): 0.5 mg via ORAL
  Filled 2015-08-20 (×2): qty 1

## 2015-08-20 MED ORDER — ALUM & MAG HYDROXIDE-SIMETH 200-200-20 MG/5ML PO SUSP
15.0000 mL | ORAL | Status: DC | PRN
  Administered 2015-08-20: 30 mL via ORAL

## 2015-08-20 MED ORDER — FUROSEMIDE 40 MG PO TABS
40.0000 mg | ORAL_TABLET | Freq: Every day | ORAL | Status: DC
  Administered 2015-08-21 – 2015-08-22 (×2): 40 mg via ORAL
  Filled 2015-08-20 (×2): qty 1

## 2015-08-20 MED ORDER — SODIUM CHLORIDE 0.9 % IJ SOLN
3.0000 mL | Freq: Two times a day (BID) | INTRAMUSCULAR | Status: DC
  Administered 2015-08-21 – 2015-08-22 (×4): 3 mL via INTRAVENOUS

## 2015-08-20 MED ORDER — MAGNESIUM HYDROXIDE 400 MG/5ML PO SUSP: 30.0000 mL | Freq: Every day | ORAL | Status: DC | PRN

## 2015-08-20 MED ORDER — MOVING RIGHT ALONG BOOK
Freq: Once | Status: AC
  Administered 2015-08-20: 16:00:00
  Filled 2015-08-20: qty 1

## 2015-08-20 MED ORDER — SODIUM CHLORIDE 0.9 % IV SOLN: 250.0000 mL | INTRAVENOUS | Status: DC | PRN

## 2015-08-20 NOTE — Progress Notes (Signed)
Patient ambulated 150 ft via rolling walker and 1 liter 02. Patient 02 sats remained in the mid 90's.  Patient resting in bed.  Call bell within reach.  RN will continue to monitor

## 2015-08-20 NOTE — Progress Notes (Signed)
2 Days Post-Op Procedure(s) (LRB): CORONARY ARTERY BYPASS GRAFTING (CABG)  X 5 UTILIZING THE LEFT INTERNAL MAMMARY ARTERY AND ENDOSCOPICALLY HARVESTED RIGHT AND LEFT SAPHENEOUS VEINS. (N/A) TRANSESOPHAGEAL ECHOCARDIOGRAM (TEE) (N/A) Subjective: Doing well Ready for step down Anxiety improved with meds  Objective: Vital signs in last 24 hours: Temp:  [97.4 F (36.3 C)-99.2 F (37.3 C)] 97.8 F (36.6 C) (08/21 0746) Pulse Rate:  [93-130] 108 (08/21 0900) Cardiac Rhythm:  [-] Sinus tachycardia (08/21 0800) Resp:  [12-36] 15 (08/21 0900) BP: (87-145)/(50-90) 105/64 mmHg (08/21 0900) SpO2:  [92 %-97 %] 96 % (08/21 0900) Arterial Line BP: (106-124)/(69-75) 119/73 mmHg (08/20 1200) Weight:  [156 lb 8.4 oz (71 kg)] 156 lb 8.4 oz (71 kg) (08/21 0500)  Hemodynamic parameters for last 24 hours: PAP: (49-55)/(28-32) 49/29 mmHg CO:  [3.5 L/min] 3.5 L/min CI:  [2 L/min/m2] 2 L/min/m2  Intake/Output from previous day: 08/20 0701 - 08/21 0700 In: 1258.2 [P.O.:480; I.V.:678.2; IV Piggyback:100] Out: 2560 [Urine:2490; Chest Tube:70] Intake/Output this shift: Total I/O In: 24.1 [I.V.:24.1] Out: -   Incisions clean Lungs clear  Lab Results:  Recent Labs  08/19/15 1700 08/19/15 1704 08/20/15 0400  WBC 11.5*  --  11.1*  HGB 10.5* 10.9* 9.5*  HCT 31.6* 32.0* 28.8*  PLT 136*  --  122*   BMET:  Recent Labs  08/19/15 0400  08/19/15 1704 08/20/15 0400  NA 135  --  136 134*  K 4.1  --  4.0 3.8  CL 104  --  96* 98*  CO2 25  --   --  29  GLUCOSE 154*  --  123* 110*  BUN 9  --  13 12  CREATININE 0.86  < > 0.90 0.89  CALCIUM 8.2*  --   --  8.5*  < > = values in this interval not displayed.  PT/INR:  Recent Labs  08/18/15 1330  LABPROT 18.1*  INR 1.49   ABG    Component Value Date/Time   PHART 7.371 08/18/2015 2008   HCO3 23.5 08/18/2015 2008   TCO2 26 08/19/2015 1704   ACIDBASEDEF 2.0 08/18/2015 2008   O2SAT 93.0 08/18/2015 2008   CBG (last 3)   Recent Labs  08/20/15 0021 08/20/15 0428 08/20/15 0744  GLUCAP 113* 103* 111*    Assessment/Plan: S/P Procedure(s) (LRB): CORONARY ARTERY BYPASS GRAFTING (CABG)  X 5 UTILIZING THE LEFT INTERNAL MAMMARY ARTERY AND ENDOSCOPICALLY HARVESTED RIGHT AND LEFT SAPHENEOUS VEINS. (N/A) TRANSESOPHAGEAL ECHOCARDIOGRAM (TEE) (N/A) Mobilize Diuresis Diabetes control d/c tubes/lines Plan for transfer to step-down: see transfer orders xanax bid prn anxiety   LOS: 6 days    Adam Shelton 08/20/2015

## 2015-08-21 ENCOUNTER — Inpatient Hospital Stay (HOSPITAL_COMMUNITY): Payer: BLUE CROSS/BLUE SHIELD

## 2015-08-21 LAB — BASIC METABOLIC PANEL
Anion gap: 6 (ref 5–15)
BUN: 10 mg/dL (ref 6–20)
CO2: 31 mmol/L (ref 22–32)
Calcium: 8.5 mg/dL — ABNORMAL LOW (ref 8.9–10.3)
Chloride: 98 mmol/L — ABNORMAL LOW (ref 101–111)
Creatinine, Ser: 0.97 mg/dL (ref 0.61–1.24)
GFR calc Af Amer: >60 mL/min (ref >60)
GFR calc non Af Amer: >60 mL/min (ref >60)
Glucose, Bld: 93 mg/dL (ref 65–99)
Potassium: 3.7 mmol/L (ref 3.5–5.1)
Sodium: 135 mmol/L (ref 135–145)

## 2015-08-21 LAB — CBC
HCT: 27.5 % — ABNORMAL LOW (ref 39.0–52.0)
Hemoglobin: 9 g/dL — ABNORMAL LOW (ref 13.0–17.0)
MCH: 26.7 pg (ref 26.0–34.0)
MCHC: 32.7 g/dL (ref 30.0–36.0)
MCV: 81.6 fL (ref 78.0–100.0)
Platelets: 112 10*3/uL — ABNORMAL LOW (ref 150–400)
RBC: 3.37 MIL/uL — ABNORMAL LOW (ref 4.22–5.81)
RDW: 14.6 % (ref 11.5–15.5)
WBC: 8.4 10*3/uL (ref 4.0–10.5)

## 2015-08-21 MED ORDER — LEVALBUTEROL HCL 0.63 MG/3ML IN NEBU
0.6300 mg | INHALATION_SOLUTION | Freq: Once | RESPIRATORY_TRACT | Status: AC
  Administered 2015-08-21: 0.63 mg via RESPIRATORY_TRACT
  Filled 2015-08-21: qty 3

## 2015-08-21 MED ORDER — METOPROLOL TARTRATE 25 MG PO TABS
25.0000 mg | ORAL_TABLET | Freq: Two times a day (BID) | ORAL | Status: DC
  Administered 2015-08-21 – 2015-08-22 (×4): 25 mg via ORAL
  Filled 2015-08-21 (×4): qty 1

## 2015-08-21 MED ORDER — LEVALBUTEROL HCL 0.63 MG/3ML IN NEBU
0.6300 mg | INHALATION_SOLUTION | Freq: Three times a day (TID) | RESPIRATORY_TRACT | Status: DC | PRN
  Administered 2015-08-21: 0.63 mg via RESPIRATORY_TRACT
  Filled 2015-08-21: qty 3

## 2015-08-21 MED ORDER — POTASSIUM CHLORIDE CRYS ER 20 MEQ PO TBCR
30.0000 meq | EXTENDED_RELEASE_TABLET | Freq: Once | ORAL | Status: AC
  Administered 2015-08-21: 30 meq via ORAL

## 2015-08-21 MED ORDER — ENSURE ENLIVE PO LIQD
237.0000 mL | Freq: Three times a day (TID) | ORAL | Status: DC
  Administered 2015-08-21 – 2015-08-23 (×6): 237 mL via ORAL

## 2015-08-21 NOTE — Care Management Note (Signed)
Case Management Note Marvetta Gibbons RN, BSN Unit 2W-Case Manager [phone]  Patient Details  Name: Adam Shelton MRN: [id number] Date of Birth: 1951/02/14  Subjective/Objective:    Pt admitted with NSTEMI, s/p CABG x5 on 8/19                Action/Plan: PTA pt lived at home with spouse- anticipate return home with spouse- when medically stable- NCM to follow for any d/c needs  Expected Discharge Date:                  Expected Discharge Plan:  Home/Self Care  In-House Referral:     Discharge planning Services  CM Consult  Post Acute Care Choice:    Choice offered to:     DME Arranged:    DME Agency:     HH Arranged:    HH Agency:     Status of Service:  In process, will continue to follow  Medicare Important Message Given:    Date Medicare IM Given:    Medicare IM give by:    Date Additional Medicare IM Given:    Additional Medicare Important Message give by:     If discussed at Long Length of Stay Meetings, dates discussed:  08/21/15  Additional Comments:  Dawayne Patricia, RN 08/21/2015, 11:34 AM

## 2015-08-21 NOTE — Progress Notes (Signed)
Utilization review completed.  

## 2015-08-21 NOTE — Progress Notes (Addendum)
      [address]       [place] [address]             [phone]        3 Days Post-Op Procedure(s) (LRB): CORONARY ARTERY BYPASS GRAFTING (CABG)  X 5 UTILIZING THE LEFT INTERNAL MAMMARY ARTERY AND ENDOSCOPICALLY HARVESTED RIGHT AND LEFT SAPHENEOUS VEINS. (N/A) TRANSESOPHAGEAL ECHOCARDIOGRAM (TEE) (N/A)  Subjective: Patient feels a little better this am. Xanax helped with anxiety yesterday.  Objective: Vital signs in last 24 hours: Temp:  [97.7 F (36.5 C)-98 F (36.7 C)] 97.9 F (36.6 C) (08/22 0700) Pulse Rate:  [106-127] 121 (08/22 0700) Cardiac Rhythm:  [-] Sinus tachycardia (08/21 1905) Resp:  [13-18] 18 (08/22 0700) BP: (92-157)/(62-91) 104/73 mmHg (08/22 0700) SpO2:  [90 %-96 %] 95 % (08/22 0700) Weight:  [155 lb (70.308 kg)] 155 lb (70.308 kg) (08/22 0523)  Pre op weight 75 kg Current Weight  08/21/15 155 lb (70.308 kg)      Intake/Output from previous day: 08/21 0701 - 08/22 0700 In: 649.5 [P.O.:600; I.V.:49.5] Out: 1600 [Urine:1600]   Physical Exam:  Cardiovascular: Tachycardic Pulmonary: Expiratory wheezing Abdomen: Soft, non tender, bowel sounds present. Extremities: Mild bilateral lower extremity edema. Wounds: Clean and dry.  No erythema or signs of infection.  Lab Results: CBC: Recent Labs  08/20/15 0400 08/21/15 0322  WBC 11.1* 8.4  HGB 9.5* 9.0*  HCT 28.8* 27.5*  PLT 122* 112*   BMET:  Recent Labs  08/20/15 0400 08/21/15 0322  NA 134* 135  K 3.8 3.7  CL 98* 98*  CO2 29 31  GLUCOSE 110* 93  BUN 12 10  CREATININE 0.89 0.97  CALCIUM 8.5* 8.5*    PT/INR:  Lab Results  Component Value Date   INR 1.49 08/18/2015   INR 1.12 08/14/2015   ABG:  INR: Will add last result for INR, ABG once components are confirmed Will add last 4 CBG results once components are confirmed  Assessment/Plan:  1. CV - ST in the 110's. On Lopressor 12.5 mg bid. Will increase to 25 mg bid.  2.  Pulmonary - On 2 liters of  oxygen via [place]. Wean to room air as tolerates. CXR appears to show no pneumothorax, cardiomegaly, bibasilar atelectasis, bilateral pleural effusions, and air in stomach. Xopenex this am for wheezing. Encourage incentive spirometer and flutter valve. 3. Volume Overload - On Lasix 40 mg daily 4.  Acute blood loss anemia - H and H decreased to  9 and 27.5 5. Mild thrombocytopenia-platelets decreased to 112,000 6. Supplement potassium 7. GI-has had gastric dilatation and is on Reglan. He is eating, but small amounts. No N/V or abdominal pain. Will give Ensure 8. SCDs for DVT prophylaxis  ZIMMERMAN,DONIELLE MPA-C 08/21/2015,7:35 AM  patient examined and medical record reviewed,agree with above note. Increase metoprolol and cont lasix Tharon Aquas Trigt III 08/21/2015

## 2015-08-21 NOTE — Progress Notes (Signed)
CARDIAC REHAB PHASE I   PRE:  Rate/Rhythm: 114 ST  BP:  Supine: 123/78  Sitting:   Standing:    SaO2: 95% 2L  MODE:  Ambulation: 350 ft   POST:  Rate/Rhythm: 144 ST PVCs  To 122 with rest  BP:  Supine:   Sitting: 144/81  Standing:    SaO2: 89-90% 1L   93% 2L 0900-0935 Pt very sleepy. Wheezing. Wife states he has not been doing IS. Encouraged pt to walk on 1L with gait belt use, rolling walker and asst x 1 with fairly steady gait. Pt had some difficulty following commands as he had gotten pain med and sleepy. Heart rate to 144 but remained in sinus. To recliner and put back on 2L. Was not wheezing after walk. Wife in room. Call bell in chair. Discussed with RN that pt needs assistance with IS when more awake as wife stated he just blows in it.    Graylon Good, RN BSN  08/21/2015 9:31 AM

## 2015-08-22 MED ORDER — ASPIRIN EC 81 MG PO TBEC
81.0000 mg | DELAYED_RELEASE_TABLET | Freq: Every day | ORAL | Status: DC
  Administered 2015-08-22 – 2015-08-23 (×2): 81 mg via ORAL
  Filled 2015-08-22 (×2): qty 1

## 2015-08-22 MED FILL — Potassium Chloride Inj 2 mEq/ML: INTRAVENOUS | Qty: 40 | Status: AC

## 2015-08-22 MED FILL — Magnesium Sulfate Inj 50%: INTRAMUSCULAR | Qty: 10 | Status: AC

## 2015-08-22 MED FILL — Heparin Sodium (Porcine) Inj 1000 Unit/ML: INTRAMUSCULAR | Qty: 30 | Status: AC

## 2015-08-22 NOTE — Progress Notes (Addendum)
      [address]       [place] [address]             [phone]        4 Days Post-Op Procedure(s) (LRB): CORONARY ARTERY BYPASS GRAFTING (CABG)  X 5 UTILIZING THE LEFT INTERNAL MAMMARY ARTERY AND ENDOSCOPICALLY HARVESTED RIGHT AND LEFT SAPHENEOUS VEINS. (N/A) TRANSESOPHAGEAL ECHOCARDIOGRAM (TEE) (N/A)  Subjective: Patient had a bowel movement. No specific complaints this am.  Objective: Vital signs in last 24 hours: Temp:  [97.6 F (36.4 C)-97.7 F (36.5 C)] 97.7 F (36.5 C) (08/23 0449) Pulse Rate:  [86-122] 86 (08/23 0449) Cardiac Rhythm:  [-] Sinus tachycardia (08/22 2015) Resp:  [18] 18 (08/23 0449) BP: (98-116)/(63-73) 98/69 mmHg (08/23 0449) SpO2:  [96 %-99 %] 96 % (08/23 0449) Weight:  [153 lb 14.1 oz (69.8 kg)] 153 lb 14.1 oz (69.8 kg) (08/23 0502)  Pre op weight 75 kg Current Weight  08/22/15 153 lb 14.1 oz (69.8 kg)      Intake/Output from previous day: 08/22 0701 - 08/23 0700 In: 723 [P.O.:720; I.V.:3] Out: -    Physical Exam:  Cardiovascular: RRR Pulmonary: Slightly diminished at bases Abdomen: Soft, non tender, bowel sounds present. Extremities: SCDs in place Wounds: Clean and dry.  No erythema or signs of infection.  Lab Results: CBC:  Recent Labs  08/20/15 0400 08/21/15 0322  WBC 11.1* 8.4  HGB 9.5* 9.0*  HCT 28.8* 27.5*  PLT 122* 112*   BMET:   Recent Labs  08/20/15 0400 08/21/15 0322  NA 134* 135  K 3.8 3.7  CL 98* 98*  CO2 29 31  GLUCOSE 110* 93  BUN 12 10  CREATININE 0.89 0.97  CALCIUM 8.5* 8.5*    PT/INR:  Lab Results  Component Value Date   INR 1.49 08/18/2015   INR 1.12 08/14/2015   ABG:  INR: Will add last result for INR, ABG once components are confirmed Will add last 4 CBG results once components are confirmed  Assessment/Plan:  1. CV - SR in the 80's. On Lopressor 25 mg bid. BP labile so unable to restart Lisinopril.He was on Plavix for a previous stroke. Will discuss with surgeon  if should restart today. Will need to decrease aspirin if start Plavix.  2.  Pulmonary - On 2 liters of oxygen via [place]. Wean to room air as tolerates. Encourage incentive spirometer and flutter valve. 3. Volume Overload - On Lasix 40 mg daily. He is below pre op weight. Will stop Lasix after today. 4.  Acute blood loss anemia - H and H yesterday 9 and 27.5 5. Mild thrombocytopenia-platelets yesterday 112,000 6. Remove EPW 7. Possibly discharge in am  Debra Colon MPA-C 08/22/2015,7:26 AM

## 2015-08-22 NOTE — Discharge Instructions (Signed)
Activity: 1.May walk up steps °               2.No lifting more than ten pounds for four weeks.  °               3.No driving for four weeks. °               4.Stop any activity that causes chest pain, shortness of breath, dizziness, sweating or excessive weakness. °               5.Avoid straining. °               6.Continue with your breathing exercises daily. ° °Diet: Diabetic diet and Low fat, Low salt diet ° °Wound Care: May shower.  Clean wounds with mild soap and water daily. Contact the office at [phone] if any problems arise. ° °Coronary Artery Bypass Grafting, Care After °Refer to this sheet in the next few weeks. These instructions provide you with information on caring for yourself after your procedure. Your health care provider may also give you more specific instructions. Your treatment has been planned according to current medical practices, but problems sometimes occur. Call your health care provider if you have any problems or questions after your procedure. °WHAT TO EXPECT AFTER THE PROCEDURE °Recovery from surgery will be different for everyone. Some people feel well after 3 or 4 weeks, while for others it takes longer. After your procedure, it is typical to have the following: °· Nausea and a lack of appetite.   °· Constipation. °· Weakness and fatigue.   °· Depression or irritability.   °· Pain or discomfort at your incision site. °HOME CARE INSTRUCTIONS °· Take medicines only as directed by your health care provider. Do not stop taking medicines or start any new medicines without first checking with your health care provider. °· Take your pulse as directed by your health care provider. °· Perform deep breathing as directed by your health care provider. If you were given a device called an incentive spirometer, use it to practice deep breathing several times a day. Support your chest with a pillow or your arms when you take deep breaths or cough. °· Keep incision areas clean, dry, and  protected. Remove or change any bandages (dressings) only as directed by your health care provider. You may have skin adhesive strips over the incision areas. Do not take the strips off. They will fall off on their own. °· Check incision areas daily for any swelling, redness, or drainage. °· If incisions were made in your legs, do the following: °¨ Avoid crossing your legs.   °¨ Avoid sitting for long periods of time. Change positions every 30 minutes.   °¨ Elevate your legs when you are sitting. °· Wear compression stockings as directed by your health care provider. These stockings help keep blood clots from forming in your legs. °· Take showers once your health care provider approves. Until then, only take sponge baths. Pat incisions dry. Do not rub incisions with a washcloth or towel. Do not take baths, swim, or use a hot tub until your health care provider approves. °· Eat foods that are high in fiber, such as raw fruits and vegetables, whole grains, beans, and nuts. Meats should be lean cut. Avoid canned, processed, and fried foods. °· Drink enough fluid to keep your urine clear or pale yellow. °· Weigh yourself every day. This helps identify if you are retaining fluid that may make your heart and lungs   work harder. °· Rest and limit activity as directed by your health care provider. You may be instructed to: °¨ Stop any activity at once if you have chest pain, shortness of breath, irregular heartbeats, or dizziness. Get help right away if you have any of these symptoms. °¨ Move around frequently for short periods or take short walks as directed by your health care provider. Increase your activities gradually. You may need physical therapy or cardiac rehabilitation to help strengthen your muscles and build your endurance. °¨ Avoid lifting, pushing, or pulling anything heavier than 10 lb (4.5 kg) for at least 6 weeks after surgery. °· Do not drive until your health care provider approves.  °· Ask your health  care provider when you may return to work. °· Ask your health care provider when you may resume sexual activity. °· Keep all follow-up visits as directed by your health care provider. This is important. °SEEK MEDICAL CARE IF: °· You have swelling, redness, increasing pain, or drainage at the site of an incision. °· You have a fever. °· You have swelling in your ankles or legs. °· You have pain in your legs.   °· You gain 2 or more pounds (0.9 kg) a day. °· You are nauseous or vomit. °· You have diarrhea.  °SEEK IMMEDIATE MEDICAL CARE IF: °· You have chest pain that goes to your jaw or arms. °· You have shortness of breath.   °· You have a fast or irregular heartbeat.   °· You notice a "clicking" in your breastbone (sternum) when you move.   °· You have numbness or weakness in your arms or legs. °· You feel dizzy or light-headed.   °MAKE SURE YOU: °· Understand these instructions. °· Will watch your condition. °· Will get help right away if you are not doing well or get worse. °Document Released: 07/05/2005 Document Revised: 05/02/2014 Document Reviewed: 05/25/2013 °Exit[employer]® Patient Information ©2015 [employer]. This information is not intended to replace advice given to you by your health care provider. Make sure you discuss any questions you have with your health care provider. ° ° ° °

## 2015-08-22 NOTE — Discharge Summary (Signed)
Physician Discharge Summary       [address]       [place] [address]             [phone]    Patient ID: Adam Shelton MRN: [id number] DOB/AGE: 08-28-1951 64 y.o.  Admit date: 08/14/2015 Discharge date: 08/23/2015  Admission Diagnoses: 1. S/p NSTEMI 2. History of CAD (s/p MI) 3. History of hypertension 4. History of dyslipidemia 5. History of CVA (cerebrovascular accident), 06/22/15 Rt frontal rec'd TPA 6. History of hypothyroidism  Discharge Diagnoses: Left main coronary artery disease 1. S/p NSTEMI 2. History of CAD (s/p MI) 3. History of hypertension 4. History of dyslipidemia 5. History of CVA (cerebrovascular accident), 06/22/15 Rt frontal rec'd TPA 6. History of hypothyroidism 7. ABL anemia 8. Mild thrombocytopenia (resolved prior to discharge)  Procedure (s):  Cardiac catheterization done by Dr. Burt Knack on 08/14/2015: 1. Prox LAD lesion, 100% stenosed. 2. LM lesion, 99% stenosed. 3. Mid Cx lesion, 90% stenosed. 4. There is moderate to severe left ventricular systolic dysfunction.  Median sternotomy, extracorporeal circulation, coronary artery bypass grafting x5 (left internal mammary artery to left anterior descending, sequential saphenous vein graft to ramus intermedius 1 and 2, saphenous vein graft to obtuse marginals 2 and 3, endoscopic vein harvest, both thighs) by Dr. Roxan Hockey on 08/18/2015.  History of Presenting Illness: Adam Shelton is a 64 yo man originally from [place]. He had an MI in 1995 treated with streptokinase. He has had a known totally occluded LAD. A nuclear study in 2012 showed anterior scar anteriorly, EF was 33%.  His history is significant for hypertension, hyperlipidemia, CAD, MI, obesity, hypothyroidism and arthritis. He had a CVA on 06/22/15 while flying home from [place]. He was treated with TPA and has minimal residual.  He has a 10 day history of substernal chest pain. These episodes having been occuring daily  between 5 and 6 AM. He also has some angina early with exertion but it improves with a brief rest and he is then able to walk. He was started on Imdur, but continued to have pain. He came to the ED and his ECG showed lateral ST depression. His troponin was 0.57.  He underwent cardiac catheterization which revealed a totally occluded LAD which is well collateralized by the RCA. He also had a severe left main stenosis and significant disease in the circumflex. His EF was 35%.  The complicating factors in his case are his recent stroke and plavix. He had a stroke in late June, about 7 weeks ago. Normally, CABG would be delayed until 3 months post stroke but given the severity of his left main disease that is not an option. He has been on plavix. Ideally, would wait 5-7 days off plavix but I do not think that is advisable for him.  P2Y12 drawn on 08/14/2015 was 146. Potential risks, benefits, and complications were discussed with the patient and he agreed to proceed with surgery. Pre operative carotid duplex showed no significant internal carotid duplex. He underwent a CABG x 5 on 08/18/2015.  Brief Hospital Course:  The patient was extubated the evening of surgery without difficulty. He remained afebrile and hemodynamically stable. Adam Shelton, a line, chest tubes, and foley were removed early in the post operative course. He did have gastric distention post op. He was started on Reglan and his diet was advanced very slowly. Lopressor was started and titrated accordingly. He was volume over loaded and diuresed. He had ABL anemia. He did not  require a post op transfusion. His last H and H was 9.6 and 29.1.  He also had mild thrombocytopenia. This did resolve as his last platelet count was up to 200,000. He was weaned off the insulin drip. The patient's glucose remained well controlled. The patient's HGA1C pre op was 5.8. He will need to follow up with his medical doctor for further surveillance of his HGA1C as he  is pre diabetic.  The patient was felt surgically stable for transfer from the ICU to [facility] for further convalescence on 08/20/2015. He did have some anxiety and was given Xanax PRN, which helped. He continues to progress with cardiac rehab. He was ambulating on room air. He has been tolerating a diet and has had a bowel movement. He was tachycardic. Initially, Lopressor was titrated;however, after speaking with the patient, he was Atenolol pre op (and had taken it for 20+ years) with good HR/Bp control. As a result, Lopressor was stopped and Atenolol 50 mg daily was started. His BP was no longer labile so he will be restarted on Perindopril 2 mg daily.Epicardial pacing wires and chest tube sutures will be removed prior to discharge. The patient is felt surgically stable for discharge today.   Latest Vital Signs: Blood pressure 130/86, pulse 94, temperature 98.5 F (36.9 C), temperature source Oral, resp. rate 19, height 5\' 4"  (1.626 m), weight 151 lb 9.6 oz (68.765 kg), SpO2 98 %.  Physical Exam: Cardiovascular: RRR Pulmonary: Slightly diminished at bases Abdomen: Soft, non tender, bowel sounds present. Extremities: SCDs in place Wounds: Clean and dry. No erythema or signs of infection.  Discharge Condition:Stable and discharged to home.  Recent laboratory studies:  Lab Results  Component Value Date   WBC 8.9 08/23/2015   HGB 9.6* 08/23/2015   HCT 29.1* 08/23/2015   MCV 81.3 08/23/2015   PLT 200 08/23/2015   Lab Results  Component Value Date   NA 135 08/21/2015   K 3.7 08/21/2015   CL 98* 08/21/2015   CO2 31 08/21/2015   CREATININE 0.97 08/21/2015   GLUCOSE 93 08/21/2015    Diagnostic Studies: Dg Chest 2 View  08/21/2015   CLINICAL DATA:  Shortness of breath, status post CABG on August 18, 2015  EXAM: CHEST  2 VIEW  COMPARISON:  Portable chest x-ray of August 20, 2015  FINDINGS: The lungs are mildly hypoinflated. There are small bilateral pleural effusions layering posteriorly.  The retrocardiac region rim remains dense bilaterally but has improved. The cardiac silhouette remains enlarged. The pulmonary vascularity is not engorged. There are 6 intact sternal wires. The right internal jugular Cordis sheath has been removed.  IMPRESSION: Interval improvement in pulmonary interstitial edema. There remains bilateral subsegmental atelectasis as well as small bilateral pleural effusions.   Electronically Signed   By: David  Martinique M.D.   On: 08/21/2015 07:41   Discharge Medications:   Medication List    STOP taking these medications        isosorbide mononitrate 30 MG 24 hr tablet  Commonly known as:  IMDUR     lisinopril 10 MG tablet  Commonly known as:  PRINIVIL,ZESTRIL     spironolactone 25 MG tablet  Commonly known as:  ALDACTONE      TAKE these medications        aspirin 81 MG EC tablet  Take 81 mg by mouth daily.     atenolol 50 MG tablet  Commonly known as:  TENORMIN  Take 1 tablet (50 mg total) by mouth daily.  atorvastatin 80 MG tablet  Commonly known as:  LIPITOR  Take 1 tablet (80 mg total) by mouth daily.     clopidogrel 75 MG tablet  Commonly known as:  PLAVIX  Take 75 mg by mouth daily.     famotidine 20 MG tablet  Commonly known as:  PEPCID  Take 20 mg by mouth as needed.     levothyroxine 75 MCG tablet  Commonly known as:  SYNTHROID, LEVOTHROID  Take 75 mcg by mouth daily before breakfast. Take 1 tablet only on Sat & Sun     levothyroxine 50 MCG tablet  Commonly known as:  SYNTHROID, LEVOTHROID  Take 50 mcg by mouth daily before breakfast. Take 1 tablet all days except Sat & Sun     niacin 500 MG tablet  Take 500 mg by mouth 3 (three) times a week.     oxyCODONE 5 MG immediate release tablet  Commonly known as:  Oxy IR/ROXICODONE  Take 1 tablet (5 mg total) by mouth every 4 (four) hours as needed for severe pain.     perindopril 4 MG tablet  Commonly known as:  ACEON  Take 0.5 tablets (2 mg total) by mouth daily.  Start  taking on:  1/76/1607     PX FOLIC ACID 371 MCG tablet  Generic drug:  folic acid  Take 062 mcg by mouth daily.     vitamin B-12 500 MCG tablet  Commonly known as:  CYANOCOBALAMIN  Take 500 mcg by mouth daily.     vitamin E 400 UNIT capsule  Take 400 Units by mouth daily.        The patient has been discharged on:   1.Beta Blocker:  Yes [  x ]                              No   [   ]                              If No, reason:  2.Ace Inhibitor/ARB: Yes [ x  ]                                     No  [  x ]                                     If No, reason:  3.Statin:   Yes [ x  ]                  No  [   ]                  If No, reason:  4.Ecasa:  Yes  [ x  ]                  No   [   ]                  If No, reason:  Follow Up Appointments: Follow-up Information    Follow up with Terald Sleeper, MD.   Specialty:  Cardiology   Why:  Call for a follow up appointment for 2 weeks   Contact information:   [place]  [address] [place] [address] [phone]       Follow up with Melrose Nakayama, MD On 09/26/2015.   Specialty:  Cardiothoracic Surgery   Why:  PA/LAT CXR to be taken (at [facility] which is in the same building as Dr. Leonarda Salon office) on 09/26/2015 at 10:45 am ;Appointment time is at 11:30 am   Contact information:   [place] [address] [phone]       Follow up with Neale Burly, MD.   Specialty:  Internal Medicine   Why:  Call for an appointment for further surveillance of HGA1C 5.8 (pre diabetes)   Contact information:   [place] [id number] [phone]       Signed: ZIMMERMAN,DONIELLE MPA-C 08/23/2015, 8:00 AM

## 2015-08-22 NOTE — Progress Notes (Signed)
08/22/2015 6:06 PM Pt ambulated 322ft with rolling walker on RA.  Pt tolerated well.  Pt back in room and up in chair.  Call bell in reach, family at bedside. [place]

## 2015-08-22 NOTE — Progress Notes (Signed)
08/22/2015 9:08 AM EPW D/C'd per order and per protocol.  Ends intact. Pt. Tolerated well.  Advised bedrest x1hr.  Call bell in reach.  Vital signs collected per protocol. [place]

## 2015-08-22 NOTE — Progress Notes (Signed)
CARDIAC REHAB PHASE I   PRE:  Rate/Rhythm: 105 ST  BP:  Supine:   Sitting: 125/72  Standing:    SaO2: 94%RA  MODE:  Ambulation: 890 ft   POST:  Rate/Rhythm: 124 ST  BP:  Supine:   Sitting: 140/80  Standing:    SaO2: 96%RA 1102-1142 Pt walked 890 ft on RA with rolling walker and asst x 1 with steady gait. Tolerated well. Not as sleepy today.  To recliner after walk. Was able to talk whole walk.   Graylon Good, RN BSN  08/22/2015 11:39 AM

## 2015-08-23 LAB — CBC
HCT: 29.1 % — ABNORMAL LOW (ref 39.0–52.0)
Hemoglobin: 9.6 g/dL — ABNORMAL LOW (ref 13.0–17.0)
MCH: 26.8 pg (ref 26.0–34.0)
MCHC: 33 g/dL (ref 30.0–36.0)
MCV: 81.3 fL (ref 78.0–100.0)
Platelets: 200 10*3/uL (ref 150–400)
RBC: 3.58 MIL/uL — ABNORMAL LOW (ref 4.22–5.81)
RDW: 14.6 % (ref 11.5–15.5)
WBC: 8.9 10*3/uL (ref 4.0–10.5)

## 2015-08-23 MED ORDER — OXYCODONE HCL 5 MG PO TABS: 5.0000 mg | ORAL_TABLET | ORAL | Status: DC | PRN

## 2015-08-23 MED ORDER — CLOPIDOGREL BISULFATE 75 MG PO TABS
75.0000 mg | ORAL_TABLET | Freq: Every day | ORAL | Status: DC
  Administered 2015-08-23: 75 mg via ORAL
  Filled 2015-08-23: qty 1

## 2015-08-23 MED ORDER — ATENOLOL 50 MG PO TABS
50.0000 mg | ORAL_TABLET | Freq: Every day | ORAL | Status: DC
Start: 2015-08-23 — End: 2015-08-28

## 2015-08-23 MED ORDER — ATENOLOL 25 MG PO TABS
50.0000 mg | ORAL_TABLET | Freq: Every day | ORAL | Status: DC
  Administered 2015-08-23: 50 mg via ORAL
  Filled 2015-08-23: qty 2

## 2015-08-23 MED ORDER — PERINDOPRIL ERBUMINE 4 MG PO TABS
2.0000 mg | ORAL_TABLET | Freq: Every day | ORAL | Status: DC
Start: 2015-08-24 — End: 2015-10-30

## 2015-08-23 MED ORDER — ATORVASTATIN CALCIUM 80 MG PO TABS: 80.0000 mg | ORAL_TABLET | Freq: Every day | ORAL | Status: DC

## 2015-08-23 MED ORDER — METOPROLOL TARTRATE 25 MG PO TABS: 37.5000 mg | ORAL_TABLET | Freq: Two times a day (BID) | ORAL | Status: DC

## 2015-08-23 NOTE — Progress Notes (Signed)
      [address]       [place] [address]             [phone]        5 Days Post-Op Procedure(s) (LRB): CORONARY ARTERY BYPASS GRAFTING (CABG)  X 5 UTILIZING THE LEFT INTERNAL MAMMARY ARTERY AND ENDOSCOPICALLY HARVESTED RIGHT AND LEFT SAPHENEOUS VEINS. (N/A) TRANSESOPHAGEAL ECHOCARDIOGRAM (TEE) (N/A)  Subjective: His only complaint is he sometimes feels heart beating fast.   Objective: Vital signs in last 24 hours: Temp:  [97.7 F (36.5 C)-98.5 F (36.9 C)] 98.5 F (36.9 C) (08/24 0449) Pulse Rate:  [94-110] 94 (08/24 0449) Cardiac Rhythm:  [-] Normal sinus rhythm (08/23 2020) Resp:  [17-19] 19 (08/24 0449) BP: (99-138)/(63-86) 130/86 mmHg (08/24 0449) SpO2:  [93 %-99 %] 98 % (08/24 0449) Weight:  [151 lb 9.6 oz (68.765 kg)] 151 lb 9.6 oz (68.765 kg) (08/24 0449)  Pre op weight 75 kg Current Weight  08/23/15 151 lb 9.6 oz (68.765 kg)      Intake/Output from previous day: 08/23 0701 - 08/24 0700 In: 603 [P.O.:600; I.V.:3] Out: -    Physical Exam:  Cardiovascular: Slightly tachy Pulmonary: Mostly clear Abdomen: Soft, non tender, bowel sounds present. Extremities: No LE edema Wounds: Clean and dry.  No erythema or signs of infection.  Lab Results: CBC:  Recent Labs  08/21/15 0322 08/23/15 0437  WBC 8.4 8.9  HGB 9.0* 9.6*  HCT 27.5* 29.1*  PLT 112* 200   BMET:   Recent Labs  08/21/15 0322  NA 135  K 3.7  CL 98*  CO2 31  GLUCOSE 93  BUN 10  CREATININE 0.97  CALCIUM 8.5*    PT/INR:  Lab Results  Component Value Date   INR 1.49 08/18/2015   INR 1.12 08/14/2015   ABG:  INR: Will add last result for INR, ABG once components are confirmed Will add last 4 CBG results once components are confirmed  Assessment/Plan:  1. CV - ST in the 100's. On Lopressor 25 mg bid. Per discussion with patient, he has been taking Atenolol for almost 20 years and his HR was well controlled. Will stop Lopressor and start Atenolol. He was  on Plavix for a previous stroke so will restart.  2.  Pulmonary - On room air. Encourage incentive spirometer and flutter valve. 3.  Acute blood loss anemia - H and H stable at 9.6 and 29.1 4. Mild thrombocytopenia resolved-platelets up to 200,000 5. Likely discharge later today  ZIMMERMAN,DONIELLE MPA-C 08/23/2015,7:31 AM

## 2015-08-23 NOTE — Progress Notes (Signed)
CARDIAC REHAB PHASE I   PRE:  Rate/Rhythm: 93 SR  BP:  Supine: 143/93  Sitting:   Standing:    SaO2: 98%RA  MODE:  Ambulation: 1040 ft   POST:  Rate/Rhythm: 104 ST  BP:  Supine:   Sitting: 139/79  Standing:    SaO2: 98%RA 0946-1048 Pt walked 1040 ft on RA with hand held asst. Gait steady. Tolerated well. Education completed with pt and wife who voiced understanding. Discussed watching carbs and heart healthy food choices. Discussed CRP 2 and permission given to refer to [facility]. Encouraged IS as pt getting to about 750 ml now.   Graylon Good, RN BSN  08/23/2015 10:45 AM

## 2015-08-28 ENCOUNTER — Encounter: Payer: Self-pay | Admitting: Cardiovascular Disease

## 2015-08-28 ENCOUNTER — Ambulatory Visit (INDEPENDENT_AMBULATORY_CARE_PROVIDER_SITE_OTHER): Payer: BLUE CROSS/BLUE SHIELD | Admitting: Cardiovascular Disease

## 2015-08-28 VITALS — BP 110/80 | HR 84 | Ht 64.0 in | Wt 149.8 lb

## 2015-08-28 DIAGNOSIS — I214 Non-ST elevation (NSTEMI) myocardial infarction: Secondary | ICD-10-CM

## 2015-08-28 DIAGNOSIS — I25812 Atherosclerosis of bypass graft of coronary artery of transplanted heart without angina pectoris: Secondary | ICD-10-CM | POA: Diagnosis not present

## 2015-08-28 DIAGNOSIS — Z87898 Personal history of other specified conditions: Secondary | ICD-10-CM

## 2015-08-28 DIAGNOSIS — Z951 Presence of aortocoronary bypass graft: Secondary | ICD-10-CM | POA: Diagnosis not present

## 2015-08-28 DIAGNOSIS — I10 Essential (primary) hypertension: Secondary | ICD-10-CM

## 2015-08-28 DIAGNOSIS — Z9289 Personal history of other medical treatment: Secondary | ICD-10-CM

## 2015-08-28 DIAGNOSIS — E785 Hyperlipidemia, unspecified: Secondary | ICD-10-CM

## 2015-08-28 DIAGNOSIS — I639 Cerebral infarction, unspecified: Secondary | ICD-10-CM

## 2015-08-28 MED ORDER — ATORVASTATIN CALCIUM 80 MG PO TABS: 80.0000 mg | ORAL_TABLET | Freq: Every day | ORAL | Status: DC

## 2015-08-28 MED ORDER — ATENOLOL 50 MG PO TABS: 50.0000 mg | ORAL_TABLET | Freq: Every day | ORAL | Status: DC

## 2015-08-28 MED ORDER — CLOPIDOGREL BISULFATE 75 MG PO TABS: 75.0000 mg | ORAL_TABLET | Freq: Every day | ORAL | Status: DC

## 2015-08-28 NOTE — Patient Instructions (Addendum)
Your physician recommends that you schedule a follow-up appointment in: 3 months with Dr. Bronson Ing  I refilled all cardiac medications   You have been referred to : CARDIAC REHAB  Thanks for choosing [facility]!!!

## 2015-08-28 NOTE — Addendum Note (Signed)
Addended by: Debbora Lacrosse R on: 08/28/2015 04:24 PM   Modules accepted: Orders

## 2015-08-28 NOTE — Progress Notes (Signed)
Patient ID: Adam Shelton, male   DOB: [birth year], 64 y.o.   MRN: [id number]      SUBJECTIVE: The patient presents for post hospitalization follow-up after undergoing 5 vessel coronary artery bypass graft surgery. Specifically , he had a left internal mammary artery to left anterior descending, sequential saphenous vein graft to ramus intermedius 1 and 2, saphenous vein graft to obtuse marginals 2 and 3 by Dr. Roxan Hockey on 08/18/2015. This is my first time meeting him. He initially sustained an MI in 1995 treated with streptokinase in [place]. Past medical history is also significant for hypertension, hyperlipidemia , obesity, hypothyroidism , and a CVA on 06/22/15 while flying home from [place]. He sustained a non-STEMI prior to undergoing bypass surgery.  Transthoracic echocardiography on 08/15/15 demonstrated mildly reduced left ventricle systolic function, LVEF 78%, ischemic wall motion abnormalities, grade 1 diastolic dysfunction, and mild mitral regurgitation.  Lipid panel on 08/15/15 showed total cholesterol 133, triglycerides 80, HDL 32 , LDL 85.  Chest x-ray on 8/22 showed interval improvement in pulmonary interstitial edema with bilateral subsegmental atelectasis and small bilateral pleural effusions.  ECG's reviewed demonstrating sinus rhythm with a diffuse T wave abnormality.  He has some musculoskeletal chest pain and takes Tylenol. He occasionally has some mild shortness of breath but denies orthopnea, paroxysmal nocturnal dyspnea, and leg swelling. He's been in the [place] with his wife and 3 sons for the past 11 years. Trying to avoid salt and spicy food.  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 3 sons, one studying biology in [place], one son doing PhD in Estate manager/land agent, another son doing PhD in Engineer, drilling.    Review of Systems: As per "subjective", otherwise negative.  No Known Allergies  Current  Outpatient Prescriptions  Medication Sig Dispense Refill  . aspirin 81 MG EC tablet Take 81 mg by mouth daily.      Marland Kitchen atenolol (TENORMIN) 50 MG tablet Take 1 tablet (50 mg total) by mouth daily.    Marland Kitchen atorvastatin (LIPITOR) 80 MG tablet Take 1 tablet (80 mg total) by mouth daily. 30 tablet 1  . clopidogrel (PLAVIX) 75 MG tablet Take 75 mg by mouth daily.    . folic acid (PX FOLIC ACID) 295 MCG tablet Take 400 mcg by mouth daily.      Marland Kitchen levothyroxine (SYNTHROID, LEVOTHROID) 50 MCG tablet Take 50 mcg by mouth daily before breakfast. Take 1 tablet all days except Sat & Sun    . levothyroxine (SYNTHROID, LEVOTHROID) 75 MCG tablet Take 75 mcg by mouth daily before breakfast. Take 1 tablet only on Sat & Sun    . perindopril (ACEON) 4 MG tablet Take 0.5 tablets (2 mg total) by mouth daily.    . vitamin B-12 (CYANOCOBALAMIN) 500 MCG tablet Take 500 mcg by mouth daily.    . vitamin E 400 UNIT capsule Take 200 Units by mouth daily.      No current facility-administered medications for this visit.    Past Medical History  Diagnosis Date  . Hypertension   . Obesity   . Dyslipidemia   . Hypothyroidism   . Pulmonary valve disorders   . Mitral valve disorders   . Heart disease, unspecified   . Heart attack 09/24/94  . Arthritis   . Stroke   . H/O: CVA (cerebrovascular accident), 06/22/15 Rt frontal rec'd TPA 08/14/2015    Past Surgical History  Procedure Laterality Date  . Hernia repair      age 43  .  Cardiac catheterization  12/10/94  . Cardiac catheterization N/A 08/14/2015    Procedure: Left Heart Cath and Coronary Angiography;  Surgeon: Sherren Mocha, MD;  Location: [facility];  Service: Cardiovascular;  Laterality: N/A;  . Coronary artery bypass graft N/A 08/18/2015    Procedure: CORONARY ARTERY BYPASS GRAFTING (CABG)  X 5 UTILIZING THE LEFT INTERNAL MAMMARY ARTERY AND ENDOSCOPICALLY HARVESTED RIGHT AND LEFT SAPHENEOUS VEINS.;  Surgeon: Melrose Nakayama, MD;  Location: [facility];   Service: Open Heart Surgery;  Laterality: N/A;  . Tee without cardioversion N/A 08/18/2015    Procedure: TRANSESOPHAGEAL ECHOCARDIOGRAM (TEE);  Surgeon: Melrose Nakayama, MD;  Location: [facility];  Service: Open Heart Surgery;  Laterality: N/A;    Social History   Social History  . Marital Status: Married    Spouse Name: N/A  . Number of Children: N/A  . Years of Education: N/A   Occupational History  . Not on file.   Social History Main Topics  . Smoking status: Never Smoker   . Smokeless tobacco: Never Used  . Alcohol Use: No  . Drug Use: No  . Sexual Activity: Not on file   Other Topics Concern  . Not on file   Social History Narrative   Retired   Has 3 children   Married to [employer]   Regular exercise--yes 5 days/week 42min     Filed Vitals:   08/28/15 1543  BP: 110/80  Pulse: 84  Height: 5\' 4"  (1.626 m)  Weight: 149 lb 12.8 oz (67.949 kg)  SpO2: 99%    PHYSICAL EXAM General: NAD HEENT: Normal. Neck: No JVD, no thyromegaly. Lungs: Clear to auscultation bilaterally with normal respiratory effort. CV: Nondisplaced PMI.  Regular rate and rhythm, normal S1/S2, no S3/S4, no murmur. No pretibial or periankle edema.  No carotid bruit. Well healed midline sternotomy incision.  Abdomen: Soft, nontender, no hepatosplenomegaly, no distention.  Neurologic: Alert and oriented x 3.  Psych: Normal affect. Skin: Normal. Musculoskeletal: Normal range of motion, no gross deformities. Extremities: No clubbing or cyanosis.   ECG: Most recent ECG reviewed.      ASSESSMENT AND PLAN: 1. CAD with recent 5-vessel CABG, LVEF 45%: Symptomatically stable. Continue ASA, atenolol, perindopril, and Lipitor (will provide refills). Will enroll in cardiac rehabilitation.  2. Essential HTN: Controlled. No changes.  3. Dyslipidemia: Lipids reviewed above. Continue Lipitor.  4. CVA: BP controlled. Continue Plavix.  5. Chronic systolic heart failure: Euvolemic. No diuretic  requirement at present.  Dispo: f/u 3 months.  Time spent: 40 minutes, of which greater than 50% was spent reviewing symptoms, relevant blood tests and studies, and discussing management plan with the patient.   Kate Sable, M.D., F.A.C.C.

## 2015-09-02 ENCOUNTER — Encounter (HOSPITAL_COMMUNITY): Payer: Self-pay | Admitting: *Deleted

## 2015-09-02 ENCOUNTER — Emergency Department (HOSPITAL_COMMUNITY)
Admission: EM | Admit: 2015-09-02 | Discharge: 2015-09-03 | Disposition: A | Discharge status: Home / Self Care | Payer: BLUE CROSS/BLUE SHIELD | Attending: Emergency Medicine | Admitting: Emergency Medicine

## 2015-09-02 DIAGNOSIS — E785 Hyperlipidemia, unspecified: Secondary | ICD-10-CM | POA: Diagnosis not present

## 2015-09-02 DIAGNOSIS — I10 Essential (primary) hypertension: Secondary | ICD-10-CM | POA: Diagnosis not present

## 2015-09-02 DIAGNOSIS — Z720 Tobacco use: Secondary | ICD-10-CM | POA: Insufficient documentation

## 2015-09-02 DIAGNOSIS — R0602 Shortness of breath: Secondary | ICD-10-CM

## 2015-09-02 DIAGNOSIS — E669 Obesity, unspecified: Secondary | ICD-10-CM | POA: Insufficient documentation

## 2015-09-02 DIAGNOSIS — Z8673 Personal history of transient ischemic attack (TIA), and cerebral infarction without residual deficits: Secondary | ICD-10-CM | POA: Insufficient documentation

## 2015-09-02 DIAGNOSIS — Z7982 Long term (current) use of aspirin: Secondary | ICD-10-CM | POA: Diagnosis not present

## 2015-09-02 DIAGNOSIS — E039 Hypothyroidism, unspecified: Secondary | ICD-10-CM | POA: Insufficient documentation

## 2015-09-02 DIAGNOSIS — M1990 Unspecified osteoarthritis, unspecified site: Secondary | ICD-10-CM | POA: Diagnosis not present

## 2015-09-02 DIAGNOSIS — Z79899 Other long term (current) drug therapy: Secondary | ICD-10-CM | POA: Insufficient documentation

## 2015-09-02 LAB — CBC WITH DIFFERENTIAL/PLATELET
BASOS ABS: 0 10*3/uL (ref 0.0–0.1)
BASOS PCT: 0 % (ref 0–1)
Eosinophils Absolute: 0.6 10*3/uL (ref 0.0–0.7)
Eosinophils Relative: 6 % — ABNORMAL HIGH (ref 0–5)
HEMATOCRIT: 33.8 % — AB (ref 39.0–52.0)
HEMOGLOBIN: 11 g/dL — AB (ref 13.0–17.0)
Lymphocytes Relative: 24 % (ref 12–46)
Lymphs Abs: 2.6 10*3/uL (ref 0.7–4.0)
MCH: 26.4 pg (ref 26.0–34.0)
MCHC: 32.5 g/dL (ref 30.0–36.0)
MCV: 81.3 fL (ref 78.0–100.0)
Monocytes Absolute: 0.7 10*3/uL (ref 0.1–1.0)
Monocytes Relative: 7 % (ref 3–12)
NEUTROS ABS: 7.1 10*3/uL (ref 1.7–7.7)
NEUTROS PCT: 63 % (ref 43–77)
Platelets: 552 10*3/uL — ABNORMAL HIGH (ref 150–400)
RBC: 4.16 MIL/uL — AB (ref 4.22–5.81)
RDW: 14.5 % (ref 11.5–15.5)
WBC: 11.1 10*3/uL — AB (ref 4.0–10.5)

## 2015-09-02 LAB — COMPREHENSIVE METABOLIC PANEL
ALBUMIN: 3.4 g/dL — AB (ref 3.5–5.0)
ALK PHOS: 127 U/L — AB (ref 38–126)
ALT: 41 U/L (ref 17–63)
AST: 30 U/L (ref 15–41)
Anion gap: 10 (ref 5–15)
BILIRUBIN TOTAL: 0.5 mg/dL (ref 0.3–1.2)
BUN: 11 mg/dL (ref 6–20)
CALCIUM: 9.2 mg/dL (ref 8.9–10.3)
CO2: 22 mmol/L (ref 22–32)
Chloride: 97 mmol/L — ABNORMAL LOW (ref 101–111)
Creatinine, Ser: 0.96 mg/dL (ref 0.61–1.24)
GFR calc Af Amer: >60 mL/min (ref >60)
GFR calc non Af Amer: >60 mL/min (ref >60)
GLUCOSE: 96 mg/dL (ref 65–99)
Potassium: 4.2 mmol/L (ref 3.5–5.1)
SODIUM: 129 mmol/L — AB (ref 135–145)
TOTAL PROTEIN: 7.9 g/dL (ref 6.5–8.1)

## 2015-09-02 LAB — TROPONIN I

## 2015-09-02 LAB — BRAIN NATRIURETIC PEPTIDE: B NATRIURETIC PEPTIDE 5: 85.5 pg/mL (ref 0.0–100.0)

## 2015-09-02 MED ORDER — HYDROCOD POLST-CPM POLST ER 10-8 MG/5ML PO SUER: 5.0000 mL | Freq: Once | ORAL | Status: DC

## 2015-09-02 NOTE — ED Notes (Signed)
Dr. Doy Mince went into to speak with the family

## 2015-09-02 NOTE — ED Notes (Signed)
Spoke with Olivia Mackie in Lab, she will add on the BNP and Troponin

## 2015-09-02 NOTE — ED Notes (Signed)
X-ray arrived to transport pt. To x-ray  . Pt. Refused to go due to concern of having a chest x-ray done at [facility].  They asked for the doctor to speak to the staff at [facility].  Explained to the pt. And family that Dr. Doy Mince would come in and speak with them as soon as possible.   Family verbalized understanding.

## 2015-09-02 NOTE — ED Provider Notes (Signed)
CSN: [id number]     Arrival date & time 09/02/15  1857 History   First MD Initiated Contact with Patient 09/02/15 1937     Chief Complaint  Patient presents with  . Shortness of Breath     (Consider location/radiation/quality/duration/timing/severity/associated sxs/prior Treatment) Patient is a 64 y.o. male presenting with shortness of breath.  Shortness of Breath Severity:  Moderate Onset quality:  Gradual Duration:  1 week Timing:  Constant Progression:  Worsening Chronicity:  New Context comment:  Had CABG a few weeks ago.  Saw PCP two days ago and had clear lungs, reassuring exam.  Today, had CXR which showed improved aeration of lungs with decreased pleural effusion. Relieved by: ambulating. Worsened by:  Nothing tried (lying down) Associated symptoms: cough   Associated symptoms: no chest pain and no fever     Past Medical History  Diagnosis Date  . Hypertension   . Obesity   . Dyslipidemia   . Hypothyroidism   . Pulmonary valve disorders   . Mitral valve disorders   . Heart disease, unspecified   . Heart attack 09/24/94  . Arthritis   . Stroke   . H/O: CVA (cerebrovascular accident), 06/22/15 Rt frontal rec'd TPA 08/14/2015   Past Surgical History  Procedure Laterality Date  . Hernia repair      age 70  . Cardiac catheterization  12/10/94  . Cardiac catheterization N/A 08/14/2015    Procedure: Left Heart Cath and Coronary Angiography;  Surgeon: Sherren Mocha, MD;  Location: [facility];  Service: Cardiovascular;  Laterality: N/A;  . Coronary artery bypass graft N/A 08/18/2015    Procedure: CORONARY ARTERY BYPASS GRAFTING (CABG)  X 5 UTILIZING THE LEFT INTERNAL MAMMARY ARTERY AND ENDOSCOPICALLY HARVESTED RIGHT AND LEFT SAPHENEOUS VEINS.;  Surgeon: Melrose Nakayama, MD;  Location: [facility];  Service: Open Heart Surgery;  Laterality: N/A;  . Tee without cardioversion N/A 08/18/2015    Procedure: TRANSESOPHAGEAL ECHOCARDIOGRAM (TEE);  Surgeon: Melrose Nakayama, MD;  Location: [facility];  Service: Open Heart Surgery;  Laterality: N/A;   Family History  Problem Relation Age of Onset  . Kidney disease Father     died age 42  . Heart disease Mother     had angioplasty   Social History  Substance Use Topics  . Smoking status: Never Smoker   . Smokeless tobacco: Never Used  . Alcohol Use: No    Review of Systems  Constitutional: Negative for fever.  Respiratory: Positive for cough and shortness of breath.   Cardiovascular: Negative for chest pain.  All other systems reviewed and are negative.     Allergies  Review of patient's allergies indicates no known allergies.  Home Medications   Prior to Admission medications   Medication Sig Start Date End Date Taking? Authorizing Provider  acetaminophen (TYLENOL) 650 MG CR tablet Take 1,300 mg by mouth every 6 (six) hours as needed for pain.   Yes Historical Provider, MD  aspirin 81 MG EC tablet Take 81 mg by mouth daily.     Yes Historical Provider, MD  atenolol (TENORMIN) 50 MG tablet Take 1 tablet (50 mg total) by mouth daily. 08/28/15  Yes Herminio Commons, MD  atorvastatin (LIPITOR) 80 MG tablet Take 1 tablet (80 mg total) by mouth daily. 08/28/15  Yes Herminio Commons, MD  clopidogrel (PLAVIX) 75 MG tablet Take 1 tablet (75 mg total) by mouth daily. 08/28/15  Yes Herminio Commons, MD  folic acid (PX FOLIC ACID) 962  MCG tablet Take 400 mcg by mouth daily.     Yes Historical Provider, MD  levothyroxine (SYNTHROID, LEVOTHROID) 50 MCG tablet Take 50 mcg by mouth daily before breakfast. Take 1 tablet all days except Sat & Sun   Yes Historical Provider, MD  levothyroxine (SYNTHROID, LEVOTHROID) 75 MCG tablet Take 75 mcg by mouth daily before breakfast. Take 1 tablet only on Sat & Sun   Yes Historical Provider, MD  Multiple Vitamins-Minerals (PRESERVISION AREDS 2 PO) Take 1 tablet by mouth every other day.   Yes Historical Provider, MD  perindopril (ACEON) 4 MG tablet Take 0.5  tablets (2 mg total) by mouth daily. 08/24/15  Yes Donielle Liston Alba, PA-C  spironolactone (ALDACTONE) 25 MG tablet Take 50 mg by mouth daily.  07/02/15  Yes Historical Provider, MD  vitamin B-12 (CYANOCOBALAMIN) 500 MCG tablet Take 500 mcg by mouth daily.   Yes Historical Provider, MD  vitamin E 400 UNIT capsule Take 200 Units by mouth daily.    Yes Historical Provider, MD   BP 141/73 mmHg  Pulse 81  Temp(Src) 98 F (36.7 C) (Oral)  Resp 26  SpO2 100% Physical Exam  Constitutional: He is oriented to person, place, and time. He appears well-developed and well-nourished. No distress.  HENT:  Head: Normocephalic and atraumatic.  Mouth/Throat: Oropharynx is clear and moist.  Eyes: Conjunctivae are normal. Pupils are equal, round, and reactive to light. No scleral icterus.  Neck: Neck supple.  Cardiovascular: Normal rate, regular rhythm, normal heart sounds and intact distal pulses.   No murmur heard. Pulmonary/Chest: Effort normal and breath sounds normal. No stridor. No respiratory distress. He has no wheezes. He has no rales.  Abdominal: Soft. He exhibits no distension. There is no tenderness.  Musculoskeletal: Normal range of motion. He exhibits no edema.  Neurological: He is alert and oriented to person, place, and time.  Skin: Skin is warm and dry. No rash noted.  Psychiatric: He has a normal mood and affect. His behavior is normal.  Nursing note and vitals reviewed.   ED Course  Procedures (including critical care time) Labs Review Labs Reviewed  CBC WITH DIFFERENTIAL/PLATELET - Abnormal; Notable for the following:    WBC 11.1 (*)    RBC 4.16 (*)    Hemoglobin 11.0 (*)    HCT 33.8 (*)    Platelets 552 (*)    Eosinophils Relative 6 (*)    All other components within normal limits  COMPREHENSIVE METABOLIC PANEL - Abnormal; Notable for the following:    Sodium 129 (*)    Chloride 97 (*)    Albumin 3.4 (*)    Alkaline Phosphatase 127 (*)    All other components within  normal limits  BRAIN NATRIURETIC PEPTIDE  TROPONIN I    Imaging Review No results found. I have personally reviewed and evaluated these images and lab results as part of my medical decision-making.   EKG Interpretation   Date/Time:  Saturday September 02 2015 19:15:31 EDT Ventricular Rate:  86 PR Interval:  138 QRS Duration: 86 QT Interval:  348 QTC Calculation: 416 R Axis:   22 Text Interpretation:  Normal sinus rhythm Septal infarct , age  undetermined T wave abnormality, consider inferolateral ischemia Abnormal  ECG t wave abnormality consistent with prior Confirmed by [facility]  MD,  TREY ([id number]) on 09/02/2015 11:45:28 PM      MDM   Final diagnoses:  Shortness of breath    64 yo male with hx of recent CVA and recent  CABG presenting with subjective SOB for past week or so.  Has also developed cough.  He had a CXR done by his PCP today which showed improvement in small effusions with no evidence of CHF (Discussed this with PCP and saw written faxed report).  His PCP is concerned that he has a component of anxiety, possibly contributing to his symptoms.  His exam and his labs are reassuring.  Ambulated without SOB or hypoxia.  He actually described his symptoms as better when he gets up and walks around. At time of discharge, his WOB was normal, O2 sats 100%, speaking in full sentences.  Advised he follow up closely and gave him and family return precautions.   Serita Grit, MD 09/03/15 [phone]

## 2015-09-02 NOTE — ED Notes (Signed)
Patient presents with c/o SOB for 3-4 days.  Had cardiac surgery on Aug 19th.  Was seen at [facility] today ahd had a chest xray done.  He spoke with his physician and he instructed him to be seen here

## 2015-09-03 MED ORDER — GUAIFENESIN 100 MG/5ML PO SOLN
5.0000 mL | Freq: Once | ORAL | Status: AC
  Administered 2015-09-03: 100 mg via ORAL
  Filled 2015-09-03: qty 5

## 2015-09-03 MED ORDER — GUAIFENESIN 100 MG/5ML PO LIQD: 100.0000 mg | Freq: Three times a day (TID) | ORAL | Status: DC | PRN

## 2015-09-03 NOTE — Discharge Instructions (Signed)

## 2015-09-25 ENCOUNTER — Other Ambulatory Visit: Payer: Self-pay | Admitting: Thoracic Surgery (Cardiothoracic Vascular Surgery)

## 2015-09-25 DIAGNOSIS — Z951 Presence of aortocoronary bypass graft: Secondary | ICD-10-CM

## 2015-09-26 ENCOUNTER — Ambulatory Visit (INDEPENDENT_AMBULATORY_CARE_PROVIDER_SITE_OTHER): Payer: Self-pay | Admitting: Thoracic Surgery (Cardiothoracic Vascular Surgery)

## 2015-09-26 ENCOUNTER — Encounter: Payer: Self-pay | Admitting: Thoracic Surgery (Cardiothoracic Vascular Surgery)

## 2015-09-26 ENCOUNTER — Ambulatory Visit
Admission: RE | Admit: 2015-09-26 | Discharge: 2015-09-26 | Disposition: A | Discharge status: Home / Self Care | Payer: BLUE CROSS/BLUE SHIELD | Source: Ambulatory Visit | Attending: Thoracic Surgery (Cardiothoracic Vascular Surgery) | Admitting: Thoracic Surgery (Cardiothoracic Vascular Surgery)

## 2015-09-26 VITALS — BP 122/86 | HR 68 | Resp 16 | Ht 64.0 in | Wt 149.0 lb

## 2015-09-26 DIAGNOSIS — Z951 Presence of aortocoronary bypass graft: Secondary | ICD-10-CM

## 2015-09-26 MED ORDER — ATORVASTATIN CALCIUM 40 MG PO TABS: 40.0000 mg | ORAL_TABLET | Freq: Every day | ORAL | Status: DC

## 2015-09-26 NOTE — Progress Notes (Signed)
[address]       [place] [address]             [phone]       HPI:  Mr. Eckardt returns today for a scheduled postoperative follow-up visit.  He is a 64 year old man who underwent coronary bypass grafting 5 on 08/18/2015.  He had a stroke about 6 weeks prior to surgery. He presented with unstable angina and had severe left main disease. Because of ongoing chest pain we had no choice but to proceed with bypass grafting. Fortunately, he did not have any neurologic issues post surgery.  He did go to the emergency room about 2 weeks ago with shortness of breath. Workup showed only some volume overload and his spironolactone dose was increased.  He has some mild incisional pain but is only taking Tylenol for that. He does complain of myalgias, which he thinks is due to the Lipitor. He was on 20 mg a day prior to surgery it was discharged home on 80 mg daily. He has not had any angina. His shortness of breath has resolved since his ER visit.  Past Medical History  Diagnosis Date  . Hypertension   . Obesity   . Dyslipidemia   . Hypothyroidism   . Pulmonary valve disorders   . Mitral valve disorders   . Heart disease, unspecified   . Heart attack 09/24/94  . Arthritis   . Stroke   . H/O: CVA (cerebrovascular accident), 06/22/15 Rt frontal rec'd TPA 08/14/2015      Current Outpatient Prescriptions  Medication Sig Dispense Refill  . acetaminophen (TYLENOL) 650 MG CR tablet Take 1,300 mg by mouth every 6 (six) hours as needed for pain.    Marland Kitchen aspirin 81 MG EC tablet Take 81 mg by mouth daily.      Marland Kitchen atenolol (TENORMIN) 50 MG tablet Take 1 tablet (50 mg total) by mouth daily. 90 tablet 6  . atorvastatin (LIPITOR) 40 MG tablet Take 1 tablet (40 mg total) by mouth daily. 60 tablet 3  . clopidogrel (PLAVIX) 75 MG tablet Take 1 tablet (75 mg total) by mouth daily. 90 tablet 6  . folic acid (PX FOLIC ACID) 086 MCG tablet Take 400 mcg by mouth daily.      Marland Kitchen  guaiFENesin (ROBITUSSIN) 100 MG/5ML liquid Take 5 mLs (100 mg total) by mouth 3 (three) times daily as needed for cough. 30 mL 0  . levothyroxine (SYNTHROID, LEVOTHROID) 50 MCG tablet Take 50 mcg by mouth daily before breakfast. Take 1 tablet all days except Sat & Sun    . levothyroxine (SYNTHROID, LEVOTHROID) 75 MCG tablet Take 75 mcg by mouth daily before breakfast. Take 1 tablet only on Sat & Sun    . Multiple Vitamins-Minerals (PRESERVISION AREDS 2 PO) Take 1 tablet by mouth every other day.    . perindopril (ACEON) 4 MG tablet Take 0.5 tablets (2 mg total) by mouth daily.    Marland Kitchen spironolactone (ALDACTONE) 25 MG tablet Take 50 mg by mouth daily.   0  . vitamin B-12 (CYANOCOBALAMIN) 500 MCG tablet Take 500 mcg by mouth daily.    . vitamin E 400 UNIT capsule Take 200 Units by mouth daily.      No current facility-administered medications for this visit.    Physical Exam BP 122/86 mmHg  Pulse 68  Resp 16  Ht 5\' 4"  (1.626 m)  Wt 149 lb (67.586 kg)  BMI 25.56 kg/m2  SpO2 98%  65 year old man in no acute distress Alert and oriented 3 with no focal neurologic deficits Cardiac regular rate and rhythm normal S1 and S2 no rub or murmur Lungs clear with equal breath sounds bilaterally Sternum stable, incision clean dry and intact Leg incisions healing well, no peripheral edema  Diagnostic Tests: I personally reviewed the chest x-ray. It shows postoperative changes with no effusions or infiltrates.  Impression: 64 year old man who is now 5 weeks out from coronary bypass grafting 5. He is doing well at current time. He is not had any recurrent angina. He did have some ischemic cardiomyopathy with ejection fraction 35%. He has no signs of congestive heart failure currently.  He is having only mild incisional pain and that is controlled with acetaminophen. He is not requiring any narcotics.  He is complaining of myalgias. He is on 80 mg daily of Lipitor. We will decrease that to 40 mg a day to  see if that makes any difference.  He may begin driving. Appropriate precautions were discussed. He is not to lift anything over 10 pounds for another week. After that there are no restrictions on his activities. However, he was advised to build into new activities gradually.  Plan:   Decrease Lipitor to 40 mg daily  He will contact Dr. Court Joy office regarding cardiac rehabilitation  I will be happy to see him back any time if I can be of any further assistance with his care  Melrose Nakayama, MD [facility] [phone]

## 2015-10-29 ENCOUNTER — Encounter (HOSPITAL_COMMUNITY): Payer: Self-pay | Admitting: Emergency Medicine

## 2015-10-29 ENCOUNTER — Emergency Department (HOSPITAL_COMMUNITY): Payer: BLUE CROSS/BLUE SHIELD

## 2015-10-29 DIAGNOSIS — Z8673 Personal history of transient ischemic attack (TIA), and cerebral infarction without residual deficits: Secondary | ICD-10-CM | POA: Insufficient documentation

## 2015-10-29 DIAGNOSIS — I252 Old myocardial infarction: Secondary | ICD-10-CM | POA: Diagnosis not present

## 2015-10-29 DIAGNOSIS — R0602 Shortness of breath: Secondary | ICD-10-CM | POA: Diagnosis not present

## 2015-10-29 DIAGNOSIS — Z79899 Other long term (current) drug therapy: Secondary | ICD-10-CM | POA: Diagnosis not present

## 2015-10-29 DIAGNOSIS — R2240 Localized swelling, mass and lump, unspecified lower limb: Secondary | ICD-10-CM | POA: Diagnosis not present

## 2015-10-29 DIAGNOSIS — Z7982 Long term (current) use of aspirin: Secondary | ICD-10-CM | POA: Insufficient documentation

## 2015-10-29 DIAGNOSIS — Z9889 Other specified postprocedural states: Secondary | ICD-10-CM | POA: Insufficient documentation

## 2015-10-29 DIAGNOSIS — E785 Hyperlipidemia, unspecified: Secondary | ICD-10-CM | POA: Diagnosis not present

## 2015-10-29 DIAGNOSIS — Z951 Presence of aortocoronary bypass graft: Secondary | ICD-10-CM | POA: Diagnosis not present

## 2015-10-29 DIAGNOSIS — I519 Heart disease, unspecified: Secondary | ICD-10-CM | POA: Diagnosis not present

## 2015-10-29 DIAGNOSIS — E039 Hypothyroidism, unspecified: Secondary | ICD-10-CM | POA: Insufficient documentation

## 2015-10-29 DIAGNOSIS — M1990 Unspecified osteoarthritis, unspecified site: Secondary | ICD-10-CM | POA: Diagnosis not present

## 2015-10-29 DIAGNOSIS — R61 Generalized hyperhidrosis: Secondary | ICD-10-CM | POA: Insufficient documentation

## 2015-10-29 DIAGNOSIS — Z7901 Long term (current) use of anticoagulants: Secondary | ICD-10-CM | POA: Insufficient documentation

## 2015-10-29 DIAGNOSIS — E669 Obesity, unspecified: Secondary | ICD-10-CM | POA: Diagnosis not present

## 2015-10-29 DIAGNOSIS — R079 Chest pain, unspecified: Secondary | ICD-10-CM | POA: Diagnosis present

## 2015-10-29 DIAGNOSIS — I10 Essential (primary) hypertension: Secondary | ICD-10-CM | POA: Diagnosis not present

## 2015-10-29 LAB — BASIC METABOLIC PANEL
Anion gap: 10 (ref 5–15)
BUN: 22 mg/dL — AB (ref 6–20)
CHLORIDE: 97 mmol/L — AB (ref 101–111)
CO2: 25 mmol/L (ref 22–32)
Calcium: 9.3 mg/dL (ref 8.9–10.3)
Creatinine, Ser: 1.03 mg/dL (ref 0.61–1.24)
GFR calc Af Amer: >60 mL/min (ref >60)
GFR calc non Af Amer: >60 mL/min (ref >60)
GLUCOSE: 182 mg/dL — AB (ref 65–99)
POTASSIUM: 3.8 mmol/L (ref 3.5–5.1)
Sodium: 132 mmol/L — ABNORMAL LOW (ref 135–145)

## 2015-10-29 LAB — I-STAT TROPONIN, ED: Troponin i, poc: 0.01 ng/mL (ref 0.00–0.08)

## 2015-10-29 NOTE — ED Notes (Signed)
C/o heaviness to center of chest that started yesterday and resolved.  Pain started again today around 5pm and radiates to bilateral arms.  Also reports sob. Denies nausea/vomiting.

## 2015-10-30 ENCOUNTER — Ambulatory Visit (INDEPENDENT_AMBULATORY_CARE_PROVIDER_SITE_OTHER): Payer: BLUE CROSS/BLUE SHIELD | Admitting: Adult Health

## 2015-10-30 ENCOUNTER — Encounter: Payer: Self-pay | Admitting: Adult Health

## 2015-10-30 ENCOUNTER — Emergency Department (HOSPITAL_COMMUNITY)
Admission: EM | Admit: 2015-10-30 | Discharge: 2015-10-30 | Disposition: A | Discharge status: Home / Self Care | Payer: BLUE CROSS/BLUE SHIELD | Attending: Emergency Medicine | Admitting: Emergency Medicine

## 2015-10-30 VITALS — BP 134/82 | HR 76 | Ht 64.0 in | Wt 149.0 lb

## 2015-10-30 DIAGNOSIS — I10 Essential (primary) hypertension: Secondary | ICD-10-CM | POA: Diagnosis not present

## 2015-10-30 DIAGNOSIS — Z951 Presence of aortocoronary bypass graft: Secondary | ICD-10-CM

## 2015-10-30 DIAGNOSIS — R072 Precordial pain: Secondary | ICD-10-CM

## 2015-10-30 DIAGNOSIS — R0602 Shortness of breath: Secondary | ICD-10-CM

## 2015-10-30 DIAGNOSIS — I519 Heart disease, unspecified: Secondary | ICD-10-CM

## 2015-10-30 LAB — CBC
HEMATOCRIT: 40 % (ref 39.0–52.0)
HEMOGLOBIN: 12.9 g/dL — AB (ref 13.0–17.0)
MCH: 25.6 pg — ABNORMAL LOW (ref 26.0–34.0)
MCHC: 32.3 g/dL (ref 30.0–36.0)
MCV: 79.4 fL (ref 78.0–100.0)
Platelets: 231 10*3/uL (ref 150–400)
RBC: 5.04 MIL/uL (ref 4.22–5.81)
RDW: 14 % (ref 11.5–15.5)
WBC: 9.8 10*3/uL (ref 4.0–10.5)

## 2015-10-30 LAB — I-STAT TROPONIN, ED: TROPONIN I, POC: 0 ng/mL (ref 0.00–0.08)

## 2015-10-30 LAB — BRAIN NATRIURETIC PEPTIDE: B Natriuretic Peptide: 66.9 pg/mL (ref 0.0–100.0)

## 2015-10-30 MED ORDER — ATENOLOL 50 MG PO TABS: 50.0000 mg | ORAL_TABLET | Freq: Every day | ORAL | Status: DC

## 2015-10-30 MED ORDER — LISINOPRIL 2.5 MG PO TABS: 2.5000 mg | ORAL_TABLET | Freq: Every day | ORAL | Status: DC

## 2015-10-30 MED ORDER — SPIRONOLACTONE 25 MG PO TABS: 25.0000 mg | ORAL_TABLET | Freq: Every day | ORAL | Status: DC

## 2015-10-30 MED ORDER — FUROSEMIDE 20 MG PO TABS: ORAL_TABLET | ORAL | Status: DC

## 2015-10-30 MED ORDER — FUROSEMIDE 10 MG/ML IJ SOLN
40.0000 mg | Freq: Once | INTRAMUSCULAR | Status: AC
  Administered 2015-10-30: 40 mg via INTRAVENOUS
  Filled 2015-10-30: qty 4

## 2015-10-30 NOTE — ED Notes (Signed)
MD at bedside. 

## 2015-10-30 NOTE — ED Provider Notes (Signed)
CSN: [id number]     Arrival date & time 10/29/15  2307 History   By signing my name below, I, Forrestine Him, attest that this documentation has been prepared under the direction and in the presence of Leo Grosser, MD.  Electronically Signed: Forrestine Him, ED Scribe. 10/30/2015. 1:25 AM.   Chief Complaint  Patient presents with  . Chest Pain   Patient is a 64 y.o. male presenting with chest pain. The history is provided by the patient. No language interpreter was used.  Chest Pain Pain location:  L chest Pain quality: pressure   Pain radiates to:  L arm and R arm Pain radiates to the back: no   Pain severity:  Moderate Onset quality:  Gradual Duration:  1 day Timing:  Constant Progression:  Improving Chronicity:  Recurrent Relieved by:  Nitroglycerin Worsened by:  Nothing tried Associated symptoms: diaphoresis and shortness of breath   Associated symptoms: no abdominal pain, no back pain, no cough, no fever, no headache, no nausea and not vomiting     HPI Comments: Adam Shelton is a 64 y.o. male with a PMHx of HTN, mitral valve disorder, and MI who presents to the Emergency Department complaining of constant, ongoing chest pain that radiates to arms bilaterally x 1 day. Pain is described as pressure. Ongoing shortness of breath with exertion, diaphoresis, fullness in throat, along with a 5-6 pound weight gain in last 2 months also reported. No aggravating or alleviating factors at this time. Prescribed Nitro attempted prior to arrival with improvement for symptoms. No recent fever, chills, nausea, or vomiting. PSHx includes CABG 08/18/15.  PCP: Neale Burly, MD    Past Medical History  Diagnosis Date  . Hypertension   . Obesity   . Dyslipidemia   . Hypothyroidism   . Pulmonary valve disorders   . Mitral valve disorders   . Heart disease, unspecified   . Heart attack ([place]) 09/24/94  . Arthritis   . Stroke ([place])   . H/O: CVA (cerebrovascular accident), 06/22/15 Rt frontal  rec'd TPA 08/14/2015   Past Surgical History  Procedure Laterality Date  . Hernia repair      age 71  . Cardiac catheterization  12/10/94  . Cardiac catheterization N/A 08/14/2015    Procedure: Left Heart Cath and Coronary Angiography;  Surgeon: Sherren Mocha, MD;  Location: [place] [facility];  Service: Cardiovascular;  Laterality: N/A;  . Coronary artery bypass graft N/A 08/18/2015    Procedure: CORONARY ARTERY BYPASS GRAFTING (CABG)  X 5 UTILIZING THE LEFT INTERNAL MAMMARY ARTERY AND ENDOSCOPICALLY HARVESTED RIGHT AND LEFT SAPHENEOUS VEINS.;  Surgeon: Melrose Nakayama, MD;  Location: [place];  Service: Open Heart Surgery;  Laterality: N/A;  . Tee without cardioversion N/A 08/18/2015    Procedure: TRANSESOPHAGEAL ECHOCARDIOGRAM (TEE);  Surgeon: Melrose Nakayama, MD;  Location: [place];  Service: Open Heart Surgery;  Laterality: N/A;   Family History  Problem Relation Age of Onset  . Kidney disease Father     died age 29  . Heart disease Mother     had angioplasty   Social History  Substance Use Topics  . Smoking status: Never Smoker   . Smokeless tobacco: Never Used  . Alcohol Use: No    Review of Systems  Constitutional: Positive for diaphoresis and unexpected weight change. Negative for fever and chills.  Respiratory: Positive for shortness of breath. Negative for cough.   Cardiovascular: Positive for chest pain and leg swelling.  Gastrointestinal: Negative for  nausea, vomiting and abdominal pain.  Musculoskeletal: Positive for arthralgias. Negative for back pain.  Skin: Negative for rash.  Neurological: Negative for headaches.  Psychiatric/Behavioral: Negative for confusion.  All other systems reviewed and are negative.     Allergies  Review of patient's allergies indicates no known allergies.  Home Medications   Prior to Admission medications   Medication Sig Start Date End Date Taking? Authorizing Provider  acetaminophen (TYLENOL) 650 MG CR tablet Take 1,300  mg by mouth every 6 (six) hours as needed for pain.    Historical Provider, MD  aspirin 81 MG EC tablet Take 81 mg by mouth daily.      Historical Provider, MD  atenolol (TENORMIN) 50 MG tablet Take 1 tablet (50 mg total) by mouth daily. 08/28/15   Herminio Commons, MD  atorvastatin (LIPITOR) 40 MG tablet Take 1 tablet (40 mg total) by mouth daily. 09/26/15   Melrose Nakayama, MD  clopidogrel (PLAVIX) 75 MG tablet Take 1 tablet (75 mg total) by mouth daily. 08/28/15   Herminio Commons, MD  folic acid (PX FOLIC ACID) 884 MCG tablet Take 400 mcg by mouth daily.      Historical Provider, MD  guaiFENesin (ROBITUSSIN) 100 MG/5ML liquid Take 5 mLs (100 mg total) by mouth 3 (three) times daily as needed for cough. 09/03/15   Serita Grit, MD  levothyroxine (SYNTHROID, LEVOTHROID) 50 MCG tablet Take 50 mcg by mouth daily before breakfast. Take 1 tablet all days except Sat & Sun    Historical Provider, MD  levothyroxine (SYNTHROID, LEVOTHROID) 75 MCG tablet Take 75 mcg by mouth daily before breakfast. Take 1 tablet only on Sat & Sun    Historical Provider, MD  Multiple Vitamins-Minerals (PRESERVISION AREDS 2 PO) Take 1 tablet by mouth every other day.    Historical Provider, MD  perindopril (ACEON) 4 MG tablet Take 0.5 tablets (2 mg total) by mouth daily. 08/24/15   Donielle Liston Alba, PA-C  spironolactone (ALDACTONE) 25 MG tablet Take 50 mg by mouth daily.  07/02/15   Historical Provider, MD  vitamin B-12 (CYANOCOBALAMIN) 500 MCG tablet Take 500 mcg by mouth daily.    Historical Provider, MD  vitamin E 400 UNIT capsule Take 200 Units by mouth daily.     Historical Provider, MD   Triage Vitals: BP 123/75 mmHg  Pulse 102  Temp(Src) 97.6 F (36.4 C) (Oral)  Resp 16  SpO2 95%   Physical Exam  Constitutional: He is oriented to person, place, and time. He appears well-developed and well-nourished.  HENT:  Head: Normocephalic and atraumatic.  Eyes: EOM are normal.  Neck: Normal range of motion. No  JVD present.  Cardiovascular: Normal rate, regular rhythm, S1 normal, S2 normal, normal heart sounds and intact distal pulses.  Exam reveals no gallop.   No murmur heard. Pulmonary/Chest: Effort normal and breath sounds normal. No respiratory distress.  Welll healing sternotomy scar  Abdominal: Soft. He exhibits no distension. There is no tenderness.  Musculoskeletal: Normal range of motion.  Neurological: He is alert and oriented to person, place, and time.  Skin: Skin is warm and dry.  Psychiatric: He has a normal mood and affect. Judgment normal.  Nursing note and vitals reviewed.   ED Course  Procedures (including critical care time)  Emergency Focused Ultrasound Exam Limited Ultrasound of the Heart and Pericardium  Performed and interpreted by Dr. Laneta Simmers Indication: shortness of breath Multiple views of the heart, pericardium, and IVC are obtained with a multi frequency  probe.  Findings: depressed contractility, no anechoic fluid, variable IVC collapse Interpretation: depressed ejection fraction c/w prior EF <40%, no pericardial effusion, no elevated CVP Images archived electronically.  CPT Code: [id number]   DIAGNOSTIC STUDIES: Oxygen Saturation is 95% on RA, adequate by my interpretation.    COORDINATION OF CARE: 1:05 AM- Will give lasix. Will order CXR, BNP, i-stat troponin, BMP. CBC, and EKG. Discussed treatment plan with pt at bedside and pt agreed to plan.     Labs Review Labs Reviewed  BASIC METABOLIC PANEL - Abnormal; Notable for the following:    Sodium 132 (*)    Chloride 97 (*)    Glucose, Bld 182 (*)    BUN 22 (*)    All other components within normal limits  CBC - Abnormal; Notable for the following:    Hemoglobin 12.9 (*)    MCH 25.6 (*)    All other components within normal limits  BRAIN NATRIURETIC PEPTIDE  I-STAT TROPOININ, ED  I-STAT TROPOININ, ED    Imaging Review Dg Chest 2 View  10/30/2015  CLINICAL DATA:  Chest pain and shortness of breath  beginning yesterday. EXAM: CHEST  2 VIEW COMPARISON:  Chest radiograph September 26, 2015 FINDINGS: Cardiac silhouette is normal in size, status post median sternotomy for CABG. No pleural effusion or focal consolidation. No pneumothorax. Soft tissue planes included osseous structures are nonsuspicious. Mild degenerative change of thoracic spine. IMPRESSION: No acute cardiopulmonary process. Electronically Signed   By: Elon Alas M.D.   On: 10/30/2015 01:10   I have personally reviewed and evaluated these images and lab results as part of my medical decision-making.   EKG Interpretation   Date/Time:  Sunday October 29 2015 23:21:11 EDT Ventricular Rate:  93 PR Interval:  142 QRS Duration: 80 QT Interval:  342 QTC Calculation: 425 R Axis:   38 Text Interpretation:  Normal sinus rhythm Possible Left atrial enlargement  Septal infarct , age undetermined ST \\T \ T wave abnormality, consider  inferolateral ischemia Abnormal ECG Confirmed by Tyrin Herbers MD, Nan Maya ([id number])  on 10/30/2015 1:05:01 AM      MDM   Final diagnoses:  LV dysfunction  H/O coronary artery bypass surgery  Shortness of breath    64 y.o. male presents with ongoing shortness of breath with activity and some chest pressure for over 24 hours. Has h/o CABG 2 months ago. Has had some modest weight gain since surgery. Pain and breathing better with nitroglycerin. Delta troponin analysis is negative, no active chest pain in ED after lasix administered and started diuresing. Suspect mild fluid overload in setting of known LV dysfunction as opposed to ACS with no evidence of myocardial injury on labs. No significant elevation of BNP or hemodynamic compromise. Will schedule expedited follow up with cardiology to revise medications and re-evaluate. Plan to follow up with PCP as needed and return precautions discussed for worsening or new concerning symptoms.   I personally performed the services described in this documentation,  which was scribed in my presence. The recorded information has been reviewed and is accurate.    Leo Grosser, MD 10/30/15 [phone]

## 2015-10-30 NOTE — Addendum Note (Signed)
Addended by: Levonne Hubert on: 10/30/2015 04:50 PM   Modules accepted: Orders

## 2015-10-30 NOTE — Progress Notes (Deleted)
Name: Adam Shelton    DOB: [birth year]  Age: 64 y.o.  MR#: [id number]       PCP:  Neale Burly, MD      Insurance: Payor: BLUE CROSS BLUE SHIELD / Plan: BCBS OTHER / Product Type: *No Product type* /   CC:   No chief complaint on file.   VS Filed Vitals:   10/30/15 1446 10/30/15 1449  BP: 134/82   Pulse: 76   Height: 5\' 4"  (1.626 m) 5\' 4"  (1.626 m)  Weight: 149 lb (67.586 kg) 149 lb (67.586 kg)  SpO2: 99%     Weights Current Weight  10/30/15 149 lb (67.586 kg)  09/26/15 149 lb (67.586 kg)  08/28/15 149 lb 12.8 oz (67.949 kg)    Blood Pressure  BP Readings from Last 3 Encounters:  10/30/15 134/82  10/30/15 98/75  09/26/15 122/86     Admit date:  (Not on file) Last encounter with RMR:  Visit date not found   Allergy Review of patient's allergies indicates no known allergies.  Current Outpatient Prescriptions  Medication Sig Dispense Refill  . acetaminophen (TYLENOL) 650 MG CR tablet Take 1,300 mg by mouth every 6 (six) hours as needed for pain.    Marland Kitchen aspirin 81 MG EC tablet Take 81 mg by mouth daily.      Marland Kitchen atenolol (TENORMIN) 50 MG tablet Take 1 tablet (50 mg total) by mouth daily. 90 tablet 6  . atorvastatin (LIPITOR) 40 MG tablet Take 1 tablet (40 mg total) by mouth daily. 60 tablet 3  . clopidogrel (PLAVIX) 75 MG tablet Take 1 tablet (75 mg total) by mouth daily. 90 tablet 6  . folic acid (PX FOLIC ACID) 983 MCG tablet Take 400 mcg by mouth daily.      Marland Kitchen guaiFENesin (ROBITUSSIN) 100 MG/5ML liquid Take 5 mLs (100 mg total) by mouth 3 (three) times daily as needed for cough. 30 mL 0  . levothyroxine (SYNTHROID, LEVOTHROID) 50 MCG tablet Take 50 mcg by mouth daily before breakfast. Take 1 tablet all days except Sat & Sun    . levothyroxine (SYNTHROID, LEVOTHROID) 75 MCG tablet Take 75 mcg by mouth daily before breakfast. Take 1 tablet only on Sat & Sun    . Multiple Vitamins-Minerals (PRESERVISION AREDS 2 PO) Take 1 tablet by mouth every other day.    . perindopril  (ACEON) 4 MG tablet Take 0.5 tablets (2 mg total) by mouth daily.    Marland Kitchen spironolactone (ALDACTONE) 25 MG tablet Take 25 mg by mouth daily.   0  . vitamin B-12 (CYANOCOBALAMIN) 500 MCG tablet Take 500 mcg by mouth daily.    . vitamin E 400 UNIT capsule Take 200 Units by mouth daily.      No current facility-administered medications for this visit.    Discontinued Meds:   There are no discontinued medications.  Patient Active Problem List   Diagnosis Date Noted  . S/P CABG x 5 08/18/2015  . NSTEMI (non-ST elevation myocardial infarction) ([place]) 08/14/2015  . H/O: CVA (cerebrovascular accident), 06/22/15 Rt frontal rec'd TPA 08/14/2015  . NSTEMI (non-ST elevated myocardial infarction) ([place]) 08/14/2015  . LV dysfunction 11/12/2011  . Coronary artery disease 04/02/2011  . Hypertension 04/02/2011  . Dyslipidemia 04/02/2011  . Hypothyroidism 04/02/2011    LABS    Component Value Date/Time   NA 132* 10/29/2015 2322   NA 129* 09/02/2015 2019   NA 135 08/21/2015 0322   K 3.8 10/29/2015 2322   K 4.2 09/02/2015  2019   K 3.7 08/21/2015 0322   CL 97* 10/29/2015 2322   CL 97* 09/02/2015 2019   CL 98* 08/21/2015 0322   CO2 25 10/29/2015 2322   CO2 22 09/02/2015 2019   CO2 31 08/21/2015 0322   GLUCOSE 182* 10/29/2015 2322   GLUCOSE 96 09/02/2015 2019   GLUCOSE 93 08/21/2015 0322   BUN 22* 10/29/2015 2322   BUN 11 09/02/2015 2019   BUN 10 08/21/2015 0322   CREATININE 1.03 10/29/2015 2322   CREATININE 0.96 09/02/2015 2019   CREATININE 0.97 08/21/2015 0322   CALCIUM 9.3 10/29/2015 2322   CALCIUM 9.2 09/02/2015 2019   CALCIUM 8.5* 08/21/2015 0322   GFRNONAA >60 10/29/2015 2322   GFRNONAA >60 09/02/2015 2019   GFRNONAA >60 08/21/2015 0322   GFRAA >60 10/29/2015 2322   GFRAA >60 09/02/2015 2019   GFRAA >60 08/21/2015 0322   CMP     Component Value Date/Time   NA 132* 10/29/2015 2322   K 3.8 10/29/2015 2322   CL 97* 10/29/2015 2322   CO2 25 10/29/2015 2322   GLUCOSE 182*  10/29/2015 2322   BUN 22* 10/29/2015 2322   CREATININE 1.03 10/29/2015 2322   CALCIUM 9.3 10/29/2015 2322   PROT 7.9 09/02/2015 2019   ALBUMIN 3.4* 09/02/2015 2019   AST 30 09/02/2015 2019   ALT 41 09/02/2015 2019   ALKPHOS 127* 09/02/2015 2019   BILITOT 0.5 09/02/2015 2019   GFRNONAA >60 10/29/2015 2322   GFRAA >60 10/29/2015 2322       Component Value Date/Time   WBC 9.8 10/29/2015 2322   WBC 11.1* 09/02/2015 2019   WBC 8.9 08/23/2015 0437   HGB 12.9* 10/29/2015 2322   HGB 11.0* 09/02/2015 2019   HGB 9.6* 08/23/2015 0437   HCT 40.0 10/29/2015 2322   HCT 33.8* 09/02/2015 2019   HCT 29.1* 08/23/2015 0437   MCV 79.4 10/29/2015 2322   MCV 81.3 09/02/2015 2019   MCV 81.3 08/23/2015 0437    Lipid Panel     Component Value Date/Time   CHOL 133 08/15/2015 0441   TRIG 80 08/15/2015 0441   HDL 32* 08/15/2015 0441   CHOLHDL 4.2 08/15/2015 0441   VLDL 16 08/15/2015 0441   LDLCALC 85 08/15/2015 0441    ABG    Component Value Date/Time   PHART 7.371 08/18/2015 2008   PCO2ART 40.7 08/18/2015 2008   PO2ART 72.0* 08/18/2015 2008   HCO3 23.5 08/18/2015 2008   TCO2 26 08/19/2015 1704   ACIDBASEDEF 2.0 08/18/2015 2008   O2SAT 93.0 08/18/2015 2008     Lab Results  Component Value Date   TSH 3.386 08/14/2015   BNP (last 3 results)  Recent Labs  09/02/15 2217 10/29/15 2322  BNP 85.5 66.9    ProBNP (last 3 results) No results for input(s): PROBNP in the last 8760 hours.  Cardiac Panel (last 3 results) No results for input(s): CKTOTAL, CKMB, TROPONINI, RELINDX in the last 72 hours.  Iron/TIBC/Ferritin/ %Sat No results found for: IRON, TIBC, FERRITIN, IRONPCTSAT   EKG Orders placed or performed in visit on 10/30/15  . EKG 12-Lead     Prior Assessment and Plan Problem List as of 10/30/2015      Cardiovascular and Mediastinum   Coronary artery disease   Last Assessment & Plan 11/12/2011 Office Visit Written 11/12/2011  3:59 PM by Ezra Sites, MD    Status  post prior anterior wall myocardial infarction. NYHA class IIb symptoms. We will change perindopril to  lisinopril 10 mg by mouth daily. I suggested to the patient to consider cardiac catheterization but he states he cannot do this at the present time and is trying to obtain disability.      Hypertension   Last Assessment & Plan 11/12/2011 Office Visit Written 11/12/2011  4:01 PM by Ezra Sites, MD    Blood pressure well controlled continue current medical therapy      LV dysfunction   Last Assessment & Plan 11/12/2011 Office Visit Written 11/12/2011  4:01 PM by Ezra Sites, MD    Followup echocardiogram in 6 months.      NSTEMI (non-ST elevation myocardial infarction) [facility])   NSTEMI (non-ST elevated myocardial infarction) [facility])     Endocrine   Hypothyroidism   Last Assessment & Plan 11/12/2011 Office Visit Written 11/12/2011  4:00 PM by Ezra Sites, MD    Synthroid recently adjusted.        Other   Dyslipidemia   Last Assessment & Plan 11/12/2011 Office Visit Written 11/12/2011  4:00 PM by Ezra Sites, MD    LDL is 95 and statin drug therapy. Patient is questioning whether he should take niacin for an HDL of 37. I told him that recent studies have shown that there is no benefit in this strategy.      H/O: CVA (cerebrovascular accident), 06/22/15 Rt frontal rec'd TPA   S/P CABG x 5       Imaging: Dg Chest 2 View  10/30/2015  CLINICAL DATA:  Chest pain and shortness of breath beginning yesterday. EXAM: CHEST  2 VIEW COMPARISON:  Chest radiograph September 26, 2015 FINDINGS: Cardiac silhouette is normal in size, status post median sternotomy for CABG. No pleural effusion or focal consolidation. No pneumothorax. Soft tissue planes included osseous structures are nonsuspicious. Mild degenerative change of thoracic spine. IMPRESSION: No acute cardiopulmonary process. Electronically Signed   By: Elon Alas M.D.   On: 10/30/2015 01:10

## 2015-10-30 NOTE — Patient Instructions (Signed)
Your physician recommends that you schedule a follow-up appointment in: [place] has requested that you have an echocardiogram. Echocardiography is a painless test that uses sound waves to create images of your heart. It provides your doctor with information about the size and shape of your heart and how well your heart's chambers and valves are working. This procedure takes approximately one hour. There are no restrictions for this procedure.   Your physician has recommended you make the following change in your medication:   Take Atenolol 50 mg Daily Take Spironolactone 25 mg Daily Start Lasix 20 mg 2 Times Weekly  Start Lisinopril 2.5 mg Daily  Stop Taking Perindopril   Stop Taking Atorvastain for 4 days.   You have been referred to Cardiac Rehab.  If you need a refill on your cardiac medications before your next appointment, please call your pharmacy. Thank you for choosing [facility]!

## 2015-10-30 NOTE — Progress Notes (Signed)
Cardiology Office Note   Date:  10/30/2015   ID:  Adam Shelton, DOB 1951/10/20, MRN [id number]  PCP:  Adam Burly, MD  Cardiologist: Adam Chen, NP   Chief Complaint  Patient presents with  . Coronary Artery Disease  . Shortness of Breath      History of Present Illness: Adam Shelton is a 64 y.o. male who presents for ongoing assessment and management of CAD with hx of 5 vessel CABG, left internal mammary artery to left anterior descending, sequential saphenous vein graft to ramus intermedius 1 and 2, saphenous vein graft to obtuse marginals 2 and 3 by Dr. Roxan Hockey on 08/18/2015.      Most recent echo on 08/15/2015 demonstrated EF of 45%, with grade I diastolic dysfunction.  TEE on 08/18/2015 demonstrated The estimated ejection fraction was in the range of 30%to 35%. Akinesis of the inferoseptal myocardium. Akinesis of theanteroseptal myocardium. Hypokinesis of the inferior myocardium.Hypokinesis of the lateral myocardium. He was seen by CVTS on 09/26/2015 and was complaining of incisional pain. He does complain of myalgias, which he thinks is due to the Lipitor. He was on 20 mg a day prior to surgery it was discharged home on 80 mg which was decreased to 40 mg daily.  He was to continue cardiac rehab.        He was seen today in the ER with complaints of chest pain and found to have mild fluid retention although CXR was negative for CHF or pneumonia.   He comes today very anxious with multiple questions about his medications and need for explanation of his heart function. He is taking two medications from [place]. He states that when he walks he is easily dyspneic and has arm pain. He has not started cardiac rehab as he states that he was never called. He states that when he was given lasix he felt better and could breath better. He walks 45 minutes a day and states lately he has been having more dyspnea after walking 10 minutes with bilateral arm pain. He states he has  been taking 50 mg of spironolactone instead of 25 mg as directed.     Past Medical History  Diagnosis Date  . Hypertension   . Obesity   . Dyslipidemia   . Hypothyroidism   . Pulmonary valve disorders   . Mitral valve disorders   . Heart disease, unspecified   . Heart attack ([place]) 09/24/94  . Arthritis   . Stroke ([place])   . H/O: CVA (cerebrovascular accident), 06/22/15 Rt frontal rec'd TPA 08/14/2015    Past Surgical History  Procedure Laterality Date  . Hernia repair      age 77  . Cardiac catheterization  12/10/94  . Cardiac catheterization N/A 08/14/2015    Procedure: Left Heart Cath and Coronary Angiography;  Surgeon: Sherren Mocha, MD;  Location: [facility];  Service: Cardiovascular;  Laterality: N/A;  . Coronary artery bypass graft N/A 08/18/2015    Procedure: CORONARY ARTERY BYPASS GRAFTING (CABG)  X 5 UTILIZING THE LEFT INTERNAL MAMMARY ARTERY AND ENDOSCOPICALLY HARVESTED RIGHT AND LEFT SAPHENEOUS VEINS.;  Surgeon: Melrose Nakayama, MD;  Location: [facility];  Service: Open Heart Surgery;  Laterality: N/A;  . Tee without cardioversion N/A 08/18/2015    Procedure: TRANSESOPHAGEAL ECHOCARDIOGRAM (TEE);  Surgeon: Melrose Nakayama, MD;  Location: [facility];  Service: Open Heart Surgery;  Laterality: N/A;     Current Outpatient Prescriptions  Medication Sig Dispense Refill  . acetaminophen (TYLENOL)  650 MG CR tablet Take 1,300 mg by mouth every 6 (six) hours as needed for pain.    Marland Kitchen aspirin 81 MG EC tablet Take 81 mg by mouth daily.      Marland Kitchen atenolol (TENORMIN) 50 MG tablet Take 1 tablet (50 mg total) by mouth daily. 90 tablet 6  . atorvastatin (LIPITOR) 40 MG tablet Take 1 tablet (40 mg total) by mouth daily. 60 tablet 3  . clopidogrel (PLAVIX) 75 MG tablet Take 1 tablet (75 mg total) by mouth daily. 90 tablet 6  . folic acid (PX FOLIC ACID) 643 MCG tablet Take 400 mcg by mouth daily.      . furosemide (LASIX) 20 MG tablet Take One Tablet  2 Times Weekly 10 tablet 11  .  guaiFENesin (ROBITUSSIN) 100 MG/5ML liquid Take 5 mLs (100 mg total) by mouth 3 (three) times daily as needed for cough. 30 mL 0  . levothyroxine (SYNTHROID, LEVOTHROID) 50 MCG tablet Take 50 mcg by mouth daily before breakfast. Take 1 tablet all days except Sat & Sun    . levothyroxine (SYNTHROID, LEVOTHROID) 75 MCG tablet Take 75 mcg by mouth daily before breakfast. Take 1 tablet only on Sat & Sun    . lisinopril (PRINIVIL,ZESTRIL) 2.5 MG tablet Take 1 tablet (2.5 mg total) by mouth daily. 90 tablet 3  . Multiple Vitamins-Minerals (PRESERVISION AREDS 2 PO) Take 1 tablet by mouth every other day.    . spironolactone (ALDACTONE) 25 MG tablet Take 25 mg by mouth daily.   0  . vitamin B-12 (CYANOCOBALAMIN) 500 MCG tablet Take 500 mcg by mouth daily.    . vitamin E 400 UNIT capsule Take 200 Units by mouth daily.      No current facility-administered medications for this visit.    Allergies:   Review of patient's allergies indicates no known allergies.    Social History:  The patient  reports that he has never smoked. He has never used smokeless tobacco. He reports that he does not drink alcohol or use illicit drugs.   Family History:  The patient's family history includes Heart disease in his mother; Kidney disease in his father.    ROS: All other systems are reviewed and negative. Unless otherwise mentioned in H&P    PHYSICAL EXAM: VS:  BP 134/82 mmHg  Pulse 76  Ht 5\' 4"  (1.626 m)  Wt 149 lb (67.586 kg)  BMI 25.56 kg/m2  SpO2 99% , BMI Body mass index is 25.56 kg/(m^2). GEN: Well nourished, well developed, in no acute distress HEENT: normal Neck: no JVD, carotid bruits, or masses Cardiac: RRR; distant, no murmurs, rubs, or gallops,no edema  Respiratory:  clear to auscultation bilaterally, normal work of breathing GI: soft, nontender, nondistended, + BS MS: no deformity or atrophy Skin: warm and dry, no rash Neuro:  Strength and sensation are intact Psych: euthymic mood, full  affect   Recent Labs: 08/14/2015: TSH 3.386 08/19/2015: Magnesium 1.9 09/02/2015: ALT 41 10/29/2015: B Natriuretic Peptide 66.9; BUN 22*; Creatinine, Ser 1.03; Hemoglobin 12.9*; Platelets 231; Potassium 3.8; Sodium 132*    Lipid Panel    Component Value Date/Time   CHOL 133 08/15/2015 0441   TRIG 80 08/15/2015 0441   HDL 32* 08/15/2015 0441   CHOLHDL 4.2 08/15/2015 0441   VLDL 16 08/15/2015 0441   LDLCALC 85 08/15/2015 0441      Wt Readings from Last 3 Encounters:  10/30/15 149 lb (67.586 kg)  09/26/15 149 lb (67.586 kg)  08/28/15 149 lb  12.8 oz (67.949 kg)     ASSESSMENT AND PLAN:  1. CAD/S/P CABG in [place] of 2016: He has multiple questions and complaints. He is very anxious and very sensitive to any pain he experiences. He was seen in the ER and had a bedside echo completed. This demonstrated   Performed and interpreted by Dr. Laneta Simmers Indication: shortness of breath Multiple views of the heart, pericardium, and IVC are obtained with a multi frequency probe.  Findings: depressed contractility, no anechoic fluid, variable IVC collapse Interpretation: depressed ejection fraction c/w prior EF <40%, no pericardial effusion, no elevated CVP Images archived electronically.  I have spent 30 minutes with this patient and his wife answering multiple questions. We will change perindopril to lisinopril 2.5 mg daily. He is getting the other from [place]. He will be given a Rx for Atenolol which is local.   2. Systolic Dysfunction: Will repeat echo for definitive evaluation of LV EF as the one done in the ER demonstrates that his EF was decreased from that documented in August. I will begin lasix 20 mg which he is to take twice a week for now until we ascertain his EF. He will have follow up BMET and Mg. Consider Delene Loll if he is a candidate.   3. Hx of TIA: Continue Plavix and ASA.   4. Muscle Pain: I have asked him to stop the atorvastatin for 4 days to see if this is helpful to  relieving this. If it does not, he is to start taking again.     Current medicines are reviewed at length with the patient today.    Labs/ tests ordered today include: BMET, Magnesium, Echo   Orders Placed This Encounter  Procedures  . Basic Metabolic Panel (BMET)  . Magnesium  . AMB referral to cardiac rehabilitation  . EKG 12-Lead  . Echocardiogram     Disposition:   FU with one month.   Signed, Jory Sims, NP  10/30/2015 4:13 PM    [facility] [address] [place], [place] [address] Phone: [phone]; Fax: [phone]

## 2015-10-30 NOTE — Discharge Instructions (Signed)

## 2015-11-01 ENCOUNTER — Encounter (HOSPITAL_COMMUNITY)
Admission: RE | Admit: 2015-11-01 | Discharge: 2015-11-01 | Disposition: A | Discharge status: Home / Self Care | Payer: BLUE CROSS/BLUE SHIELD | Source: Ambulatory Visit | Attending: Cardiovascular Disease | Admitting: Cardiovascular Disease

## 2015-11-01 ENCOUNTER — Encounter (HOSPITAL_COMMUNITY): Payer: Self-pay

## 2015-11-01 VITALS — BP 118/80 | HR 78 | Ht 64.0 in | Wt 151.8 lb

## 2015-11-01 DIAGNOSIS — I2510 Atherosclerotic heart disease of native coronary artery without angina pectoris: Secondary | ICD-10-CM | POA: Insufficient documentation

## 2015-11-01 DIAGNOSIS — Z955 Presence of coronary angioplasty implant and graft: Secondary | ICD-10-CM | POA: Insufficient documentation

## 2015-11-01 DIAGNOSIS — Z951 Presence of aortocoronary bypass graft: Secondary | ICD-10-CM

## 2015-11-01 NOTE — Progress Notes (Signed)
Cardiac/Pulmonary Rehab Medication Review by a Pharmacist  Does the patient  feel that his/her medications are working for him/her?  yes  Has the patient been experiencing any side effects to the medications prescribed?  no  Does the patient measure his/her own blood pressure or blood glucose at home?  yes   Does the patient have any problems obtaining medications due to transportation or finances?   no  Understanding of regimen: good Understanding of indications: good Potential of compliance: good  Questions asked to Determine Patient Understanding of Medication Regimen:  1. What is the name of the medication?  2. What is the medication used for?  3. When should it be taken?  4. How much should be taken?  5. How will you take it?  6. What side effects should you report?  Understanding Defined as: Excellent: All questions above are correct Good: Questions 1-4 are correct Fair: Questions 1-2 are correct  Poor: 1 or none of the above questions are correct   Pharmacist comments: Pt states he has no allergies to medications.  Currently pt c/o muscle aches from atorvastatin and has been instructed to stop this for 4 days then call back to MD for instructions.  Pt has recently stopped perindopril and started Lisinopril 2.5mg  daily per instructions from MD.  Pt states he is getting ready to start taking Prozac for anxiety and depression but has not picked it up from the pharmacy yet, plan to get it today.  Hart Robinsons A 11/01/2015 2:09 PM

## 2015-11-01 NOTE — Patient Instructions (Signed)
Pt has finished orientation and is scheduled to return to CR on 11/03/15 at 1100. Pt has been instructed to arrive to class 15 minutes early for scheduled class. Pt has been instructed to wear comfortable clothing and shoes with rubber soles. Pt has been told to take their medications 1 hour prior to coming to class.  If the patient is not going to attend class, he has been instructed to call.

## 2015-11-01 NOTE — Progress Notes (Signed)
Patient arrived for 1st visit/orientation/education at 1330. Patient was referred to CR by Dr. Bronson Ing due to CABG (z95.1). During orientation advised patient on arrival and appointment times what to wear, what to do before, during and after exercise. Reviewed attendance and class policy. Talked about inclement weather and class consultation policy. Pt is scheduled to return Cardiac Rehab on November 03, 2015 at 1100. Pt was advised to come to class 5 minutes before class starts. He was also given instructions on meeting with the dietician and attending the Family Structure classes. Patients entrance PHQ9 score was 9.  Resources given.  Patient stated it is because of new health problems.  Pt is eager to get started. Patient was able to complete 6 minute walk test. Patient was measured for the equipment. Discussed equipment safety with patient. Took patient pre-anthropometric measurements. Patient finished visit at 1530.

## 2015-11-03 ENCOUNTER — Telehealth: Payer: Self-pay | Admitting: Adult Health

## 2015-11-03 ENCOUNTER — Encounter (HOSPITAL_COMMUNITY)
Admission: RE | Admit: 2015-11-03 | Discharge: 2015-11-03 | Disposition: A | Discharge status: Home / Self Care | Payer: BLUE CROSS/BLUE SHIELD | Source: Ambulatory Visit | Attending: Cardiovascular Disease | Admitting: Cardiovascular Disease

## 2015-11-03 DIAGNOSIS — Z955 Presence of coronary angioplasty implant and graft: Secondary | ICD-10-CM | POA: Diagnosis not present

## 2015-11-03 NOTE — Telephone Encounter (Signed)
[id number] 

## 2015-11-03 NOTE — Telephone Encounter (Signed)
Pt would like to know if he is to continue taking his Lipitor --he can be reached on his wifes cell phone @ [phone]

## 2015-11-03 NOTE — Telephone Encounter (Signed)
Spoke to pt and let him know to start taking Lipitor 20 mg daily. I told him it was ok to take 1/2 of his 40 mg pill daily until he runs out, but I did send in his new RX. He voiced understanding.

## 2015-11-03 NOTE — Telephone Encounter (Signed)
Pt was given Imdur 30 mg by pcp yesterday FYI, leg cramps have resolved. He is awaiting our call

## 2015-11-03 NOTE — Telephone Encounter (Signed)
Decrease lipitor to 20 mg daily.

## 2015-11-06 ENCOUNTER — Encounter (HOSPITAL_COMMUNITY)
Admission: RE | Admit: 2015-11-06 | Discharge: 2015-11-06 | Disposition: A | Discharge status: Home / Self Care | Payer: BLUE CROSS/BLUE SHIELD | Source: Ambulatory Visit | Attending: Cardiovascular Disease | Admitting: Cardiovascular Disease

## 2015-11-06 DIAGNOSIS — Z955 Presence of coronary angioplasty implant and graft: Secondary | ICD-10-CM | POA: Diagnosis not present

## 2015-11-06 NOTE — Progress Notes (Signed)
Cardiac Rehabilitation Program Outcomes Report   Orientation:  11/01/15 Graduate Date:  tbd Discharge Date:  tbd # of sessions completed: 3  Cardiologist: Bronson Ing Family MD:  Amanda Pea Class Time:  1100  A.  Exercise Program:  Tolerates exercise @ 3.44 METS for 15 minutes and Walk Test Results:  Pre: 2.17 mets  B.  Mental Health:  Good mental attitude and PHQ-9: 9. Resources given at orientation.   C.  Education/Instruction/Skills  Accurately checks own pulse.  Rest:  82  Exercise:  105  Uses Perceived Exertion Scale and/or Dyspnea Scale  D.  Nutrition/Weight Control/Body Composition:  Adherence to prescribed nutrition program: good    E.  Blood Lipids    Lab Results  Component Value Date   CHOL 133 08/15/2015   HDL 32* 08/15/2015   LDLCALC 85 08/15/2015   TRIG 80 08/15/2015   CHOLHDL 4.2 08/15/2015    F.  Lifestyle Changes:  Making positive lifestyle changes  G.  Symptoms noted with exercise:  Asymptomatic  Report Completed By:  Stevphen Rochester RN   Comments:  This is the patients first week progress note for [facility].

## 2015-11-08 ENCOUNTER — Encounter (HOSPITAL_COMMUNITY)
Admission: RE | Admit: 2015-11-08 | Discharge: 2015-11-08 | Disposition: A | Discharge status: Home / Self Care | Payer: BLUE CROSS/BLUE SHIELD | Source: Ambulatory Visit | Attending: Cardiovascular Disease | Admitting: Cardiovascular Disease

## 2015-11-08 DIAGNOSIS — Z955 Presence of coronary angioplasty implant and graft: Secondary | ICD-10-CM | POA: Diagnosis not present

## 2015-11-09 ENCOUNTER — Ambulatory Visit (INDEPENDENT_AMBULATORY_CARE_PROVIDER_SITE_OTHER): Payer: BLUE CROSS/BLUE SHIELD | Admitting: Cardiovascular Disease

## 2015-11-09 ENCOUNTER — Encounter: Payer: Self-pay | Admitting: Cardiovascular Disease

## 2015-11-09 VITALS — BP 95/65 | HR 72 | Ht 64.0 in | Wt 152.0 lb

## 2015-11-09 DIAGNOSIS — I25708 Atherosclerosis of coronary artery bypass graft(s), unspecified, with other forms of angina pectoris: Secondary | ICD-10-CM

## 2015-11-09 DIAGNOSIS — R0602 Shortness of breath: Secondary | ICD-10-CM

## 2015-11-09 DIAGNOSIS — I10 Essential (primary) hypertension: Secondary | ICD-10-CM

## 2015-11-09 DIAGNOSIS — Z87898 Personal history of other specified conditions: Secondary | ICD-10-CM

## 2015-11-09 DIAGNOSIS — I639 Cerebral infarction, unspecified: Secondary | ICD-10-CM

## 2015-11-09 DIAGNOSIS — E785 Hyperlipidemia, unspecified: Secondary | ICD-10-CM

## 2015-11-09 DIAGNOSIS — Z951 Presence of aortocoronary bypass graft: Secondary | ICD-10-CM | POA: Diagnosis not present

## 2015-11-09 DIAGNOSIS — Z9289 Personal history of other medical treatment: Secondary | ICD-10-CM

## 2015-11-09 MED ORDER — METOPROLOL SUCCINATE ER 25 MG PO TB24: 25.0000 mg | ORAL_TABLET | Freq: Two times a day (BID) | ORAL | Status: DC

## 2015-11-09 MED ORDER — SPIRONOLACTONE 25 MG PO TABS: 12.5000 mg | ORAL_TABLET | Freq: Every day | ORAL | Status: DC

## 2015-11-09 NOTE — Patient Instructions (Addendum)
   Stop the Atenolol.  Begin Toprol XL 25mg  twice a day  - new sent to [facility] today.  Decrease Spironolactone to 12.5mg  - 1/2 tab of the 25mg  tablet Continue all other medications.   Follow up in  6-8 weeks

## 2015-11-09 NOTE — Progress Notes (Signed)
Patient ID: Adam Shelton, male   DOB: [birth year], 64 y.o.   MRN: [id number]      SUBJECTIVE: The patient presents for routine car vascular follow-up. He underwent 5 vessel coronary artery bypass graft surgery earlier this year. Specifically , he had a left internal mammary artery to left anterior descending, sequential saphenous vein graft to ramus intermedius 1 and 2, saphenous vein graft to obtuse marginals 2 and 3 by Dr. Roxan Hockey on 08/18/2015. He initially sustained an MI in 1995 treated with streptokinase in [place]. Past medical history is also significant for hypertension, hyperlipidemia , obesity, hypothyroidism , and a CVA on 06/22/15 while flying home from [place]. He sustained a non-STEMI prior to undergoing bypass surgery.  He had been complaining of muscle aches and K. Lawrence NP changed Lipitor to 20 mg, then his PCP changed it to pravastatin 20 mg. He was also started on Lasix 20 mg twice per week.  He is awaiting a repeat echocardiogram as a bedside one performed in the ED on 10/30/15 showed reduced LVEF <40%. BNP 66, chest xray normal.  He is quite anxious. He recently began taking Xanax at night to help him sleep as well as Prozac. He denies chest pain but continues to complain of shortness of breath. He is awaiting an echocardiogram which is scheduled for 11/16/15. He denies leg swelling and palpitations. He has several questions regarding his medications, as does his wife.     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 3 sons, one studying biology in [place], one son doing PhD in Estate manager/land agent, another son doing PhD in Engineer, drilling.   Review of Systems: As per "subjective", otherwise negative.  No Known Allergies  Current Outpatient Prescriptions  Medication Sig Dispense Refill  . acetaminophen (TYLENOL) 500 MG tablet Take 1,000 mg by mouth every 6 (six) hours as needed.    . ALPRAZolam (XANAX) 0.5 MG  tablet Take 1 tablet by mouth at bedtime.  0  . aspirin 81 MG EC tablet Take 81 mg by mouth daily.      Marland Kitchen atenolol (TENORMIN) 50 MG tablet Take 1 tablet (50 mg total) by mouth daily. 90 tablet 3  . clopidogrel (PLAVIX) 75 MG tablet Take 1 tablet (75 mg total) by mouth daily. 90 tablet 6  . FLUoxetine (PROZAC) 20 MG tablet Take 1 tablet by mouth daily.  0  . folic acid (PX FOLIC ACID) [id number] MCG tablet Take 400 mcg by mouth daily.      . furosemide (LASIX) 20 MG tablet Take One Tablet  2 Times Weekly 24 tablet 3  . isosorbide mononitrate (IMDUR) 30 MG 24 hr tablet Take 30 mg by mouth daily. Added by dr Sherrie Sport    . levothyroxine (SYNTHROID, LEVOTHROID) 50 MCG tablet Take 50 mcg by mouth daily before breakfast. On M-F    . levothyroxine (SYNTHROID, LEVOTHROID) 75 MCG tablet Take 75 mcg by mouth daily before breakfast. Take 1 tablet only on Sat & Sun    . lisinopril (PRINIVIL,ZESTRIL) 2.5 MG tablet Take 1 tablet (2.5 mg total) by mouth daily. 90 tablet 3  . Multiple Vitamins-Minerals (PRESERVISION AREDS 2 PO) Take 1 tablet by mouth every other day.    . nitroGLYCERIN (NITROSTAT) 0.4 MG SL tablet Place 0.4 mg under the tongue every 5 (five) minutes as needed for chest pain.    . pravastatin (PRAVACHOL) 20 MG tablet Take 1 tablet by mouth daily.  0  . spironolactone (ALDACTONE) 25 MG tablet  Take 1 tablet (25 mg total) by mouth daily. 90 tablet 3  . vitamin B-12 (CYANOCOBALAMIN) 500 MCG tablet Take 500 mcg by mouth daily.    . vitamin E 200 UNIT capsule Take 200 Units by mouth daily.     No current facility-administered medications for this visit.    Past Medical History  Diagnosis Date  . Hypertension   . Obesity   . Dyslipidemia   . Hypothyroidism   . Pulmonary valve disorders   . Mitral valve disorders   . Heart disease, unspecified   . Heart attack ([place]) 09/24/94  . Arthritis   . Stroke ([place])   . H/O: CVA (cerebrovascular accident), 06/22/15 Rt frontal rec'd TPA 08/14/2015    Past  Surgical History  Procedure Laterality Date  . Hernia repair      age 13  . Cardiac catheterization  12/10/94  . Cardiac catheterization N/A 08/14/2015    Procedure: Left Heart Cath and Coronary Angiography;  Surgeon: Sherren Mocha, MD;  Location: [facility];  Service: Cardiovascular;  Laterality: N/A;  . Coronary artery bypass graft N/A 08/18/2015    Procedure: CORONARY ARTERY BYPASS GRAFTING (CABG)  X 5 UTILIZING THE LEFT INTERNAL MAMMARY ARTERY AND ENDOSCOPICALLY HARVESTED RIGHT AND LEFT SAPHENEOUS VEINS.;  Surgeon: Melrose Nakayama, MD;  Location: [facility];  Service: Open Heart Surgery;  Laterality: N/A;  . Tee without cardioversion N/A 08/18/2015    Procedure: TRANSESOPHAGEAL ECHOCARDIOGRAM (TEE);  Surgeon: Melrose Nakayama, MD;  Location: [facility];  Service: Open Heart Surgery;  Laterality: N/A;    Social History   Social History  . Marital Status: Married    Spouse Name: N/A  . Number of Children: N/A  . Years of Education: N/A   Occupational History  . Not on file.   Social History Main Topics  . Smoking status: Never Smoker   . Smokeless tobacco: Never Used  . Alcohol Use: No  . Drug Use: No  . Sexual Activity: Not on file   Other Topics Concern  . Not on file   Social History Narrative   Retired   Has 3 children   Married to [employer]   Regular exercise--yes 5 days/week 13min     Filed Vitals:   11/09/15 0918  BP: 95/65  Pulse: 72  Height: 5\' 4"  (1.626 m)  Weight: 152 lb (68.947 kg)  SpO2: 100%    PHYSICAL EXAM General: NAD HEENT: Normal. Neck: No JVD, no thyromegaly. Lungs: Clear to auscultation bilaterally with normal respiratory effort. CV: Nondisplaced PMI. Regular rate and rhythm, normal S1/S2, no S3/S4, no murmur. No pretibial or periankle edema. No carotid bruit. Well healed midline sternotomy incision.  Abdomen: Soft, nontender, no hepatosplenomegaly, no distention.  Neurologic: Alert and oriented x 3.  Psych: Normal  affect. Skin: Normal. Musculoskeletal: Normal range of motion, no gross deformities. Extremities: No clubbing or cyanosis.   ECG: Most recent ECG reviewed.      ASSESSMENT AND PLAN: 1. CAD with recent 5-vessel CABG, LVEF 45%: Symptomatically stable. Continue ASA, lisinopril 2.5 mg, and pravastatin 20 mg. Given cardiomyopathy, will switch atenolol to Toprol-XL 25 mg bid. Await echocardiogram.   2. Essential HTN: Low normal. Will reduce spironolactone to 12.5 mg daily.  3. Dyslipidemia: Lipids previously reviewed. Continue pravastatin (Lipitor caused myalgias).  4. CVA: BP controlled. Continue Plavix.  5. Shortness of breath/Chronic systolic heart failure: Euvolemic on Lasix 20 mg on alternate days. Will continue for now and await f/u echo.  Dispo: f/u  6-8 weeks.  Time spent: 40 minutes, of which greater than 50% was spent reviewing symptoms, relevant blood tests and studies, and discussing management plan with the patient.   Kate Sable, M.D., F.A.C.C.

## 2015-11-10 ENCOUNTER — Encounter (HOSPITAL_COMMUNITY): Payer: BLUE CROSS/BLUE SHIELD

## 2015-11-13 ENCOUNTER — Encounter (HOSPITAL_COMMUNITY)
Admission: RE | Admit: 2015-11-13 | Discharge: 2015-11-13 | Disposition: A | Discharge status: Home / Self Care | Payer: BLUE CROSS/BLUE SHIELD | Source: Ambulatory Visit | Attending: Cardiovascular Disease | Admitting: Cardiovascular Disease

## 2015-11-13 DIAGNOSIS — Z955 Presence of coronary angioplasty implant and graft: Secondary | ICD-10-CM | POA: Diagnosis not present

## 2015-11-15 ENCOUNTER — Encounter (HOSPITAL_COMMUNITY)
Admission: RE | Admit: 2015-11-15 | Discharge: 2015-11-15 | Disposition: A | Discharge status: Home / Self Care | Payer: BLUE CROSS/BLUE SHIELD | Source: Ambulatory Visit | Attending: Cardiovascular Disease | Admitting: Cardiovascular Disease

## 2015-11-15 DIAGNOSIS — Z955 Presence of coronary angioplasty implant and graft: Secondary | ICD-10-CM | POA: Diagnosis not present

## 2015-11-16 ENCOUNTER — Other Ambulatory Visit: Payer: BLUE CROSS/BLUE SHIELD

## 2015-11-17 ENCOUNTER — Encounter (HOSPITAL_COMMUNITY): Payer: BLUE CROSS/BLUE SHIELD

## 2015-11-20 ENCOUNTER — Encounter (HOSPITAL_COMMUNITY)
Admission: RE | Admit: 2015-11-20 | Discharge: 2015-11-20 | Disposition: A | Discharge status: Home / Self Care | Payer: BLUE CROSS/BLUE SHIELD | Source: Ambulatory Visit | Attending: Cardiovascular Disease | Admitting: Cardiovascular Disease

## 2015-11-20 DIAGNOSIS — Z955 Presence of coronary angioplasty implant and graft: Secondary | ICD-10-CM | POA: Diagnosis not present

## 2015-11-22 ENCOUNTER — Encounter (HOSPITAL_COMMUNITY)
Admission: RE | Admit: 2015-11-22 | Discharge: 2015-11-22 | Disposition: A | Discharge status: Home / Self Care | Payer: BLUE CROSS/BLUE SHIELD | Source: Ambulatory Visit | Attending: Cardiovascular Disease | Admitting: Cardiovascular Disease

## 2015-11-22 ENCOUNTER — Inpatient Hospital Stay (HOSPITAL_COMMUNITY)
Admission: AD | Admit: 2015-11-22 | Discharge: 2015-11-29 | DRG: 270 | Disposition: A | Discharge status: Home / Self Care | Payer: BLUE CROSS/BLUE SHIELD | Source: Other Acute Inpatient Hospital | Attending: Internal Medicine | Admitting: Internal Medicine

## 2015-11-22 DIAGNOSIS — I255 Ischemic cardiomyopathy: Secondary | ICD-10-CM | POA: Diagnosis present

## 2015-11-22 DIAGNOSIS — R7303 Prediabetes: Secondary | ICD-10-CM | POA: Diagnosis present

## 2015-11-22 DIAGNOSIS — Z7902 Long term (current) use of antithrombotics/antiplatelets: Secondary | ICD-10-CM | POA: Diagnosis not present

## 2015-11-22 DIAGNOSIS — Y838 Other surgical procedures as the cause of abnormal reaction of the patient, or of later complication, without mention of misadventure at the time of the procedure: Secondary | ICD-10-CM | POA: Diagnosis present

## 2015-11-22 DIAGNOSIS — I249 Acute ischemic heart disease, unspecified: Secondary | ICD-10-CM | POA: Diagnosis not present

## 2015-11-22 DIAGNOSIS — E876 Hypokalemia: Secondary | ICD-10-CM | POA: Diagnosis present

## 2015-11-22 DIAGNOSIS — E785 Hyperlipidemia, unspecified: Secondary | ICD-10-CM | POA: Diagnosis present

## 2015-11-22 DIAGNOSIS — R570 Cardiogenic shock: Secondary | ICD-10-CM | POA: Diagnosis not present

## 2015-11-22 DIAGNOSIS — R0902 Hypoxemia: Secondary | ICD-10-CM | POA: Diagnosis not present

## 2015-11-22 DIAGNOSIS — I340 Nonrheumatic mitral (valve) insufficiency: Secondary | ICD-10-CM | POA: Diagnosis present

## 2015-11-22 DIAGNOSIS — Z951 Presence of aortocoronary bypass graft: Secondary | ICD-10-CM

## 2015-11-22 DIAGNOSIS — T464X5A Adverse effect of angiotensin-converting-enzyme inhibitors, initial encounter: Secondary | ICD-10-CM | POA: Diagnosis present

## 2015-11-22 DIAGNOSIS — I2510 Atherosclerotic heart disease of native coronary artery without angina pectoris: Secondary | ICD-10-CM | POA: Diagnosis not present

## 2015-11-22 DIAGNOSIS — E669 Obesity, unspecified: Secondary | ICD-10-CM | POA: Diagnosis present

## 2015-11-22 DIAGNOSIS — E039 Hypothyroidism, unspecified: Secondary | ICD-10-CM | POA: Diagnosis present

## 2015-11-22 DIAGNOSIS — Z955 Presence of coronary angioplasty implant and graft: Secondary | ICD-10-CM

## 2015-11-22 DIAGNOSIS — D649 Anemia, unspecified: Secondary | ICD-10-CM | POA: Diagnosis present

## 2015-11-22 DIAGNOSIS — Z79899 Other long term (current) drug therapy: Secondary | ICD-10-CM | POA: Diagnosis not present

## 2015-11-22 DIAGNOSIS — I519 Heart disease, unspecified: Secondary | ICD-10-CM | POA: Diagnosis present

## 2015-11-22 DIAGNOSIS — Z7982 Long term (current) use of aspirin: Secondary | ICD-10-CM | POA: Diagnosis not present

## 2015-11-22 DIAGNOSIS — I2582 Chronic total occlusion of coronary artery: Secondary | ICD-10-CM | POA: Diagnosis present

## 2015-11-22 DIAGNOSIS — I5021 Acute systolic (congestive) heart failure: Secondary | ICD-10-CM | POA: Diagnosis present

## 2015-11-22 DIAGNOSIS — T82898A Other specified complication of vascular prosthetic devices, implants and grafts, initial encounter: Principal | ICD-10-CM | POA: Diagnosis present

## 2015-11-22 DIAGNOSIS — Z6823 Body mass index (BMI) 23.0-23.9, adult: Secondary | ICD-10-CM

## 2015-11-22 DIAGNOSIS — Z9861 Coronary angioplasty status: Secondary | ICD-10-CM

## 2015-11-22 DIAGNOSIS — F419 Anxiety disorder, unspecified: Secondary | ICD-10-CM | POA: Diagnosis present

## 2015-11-22 DIAGNOSIS — I5041 Acute combined systolic (congestive) and diastolic (congestive) heart failure: Secondary | ICD-10-CM | POA: Diagnosis present

## 2015-11-22 DIAGNOSIS — I25110 Atherosclerotic heart disease of native coronary artery with unstable angina pectoris: Secondary | ICD-10-CM | POA: Diagnosis present

## 2015-11-22 DIAGNOSIS — Z888 Allergy status to other drugs, medicaments and biological substances status: Secondary | ICD-10-CM | POA: Diagnosis not present

## 2015-11-22 DIAGNOSIS — T783XXA Angioneurotic edema, initial encounter: Secondary | ICD-10-CM

## 2015-11-22 DIAGNOSIS — I252 Old myocardial infarction: Secondary | ICD-10-CM | POA: Diagnosis not present

## 2015-11-22 DIAGNOSIS — Z8673 Personal history of transient ischemic attack (TIA), and cerebral infarction without residual deficits: Secondary | ICD-10-CM

## 2015-11-22 DIAGNOSIS — I25708 Atherosclerosis of coronary artery bypass graft(s), unspecified, with other forms of angina pectoris: Secondary | ICD-10-CM | POA: Diagnosis not present

## 2015-11-22 DIAGNOSIS — I5023 Acute on chronic systolic (congestive) heart failure: Secondary | ICD-10-CM | POA: Diagnosis not present

## 2015-11-22 DIAGNOSIS — I509 Heart failure, unspecified: Secondary | ICD-10-CM | POA: Diagnosis not present

## 2015-11-22 DIAGNOSIS — I110 Hypertensive heart disease with heart failure: Secondary | ICD-10-CM | POA: Diagnosis present

## 2015-11-22 DIAGNOSIS — Z8249 Family history of ischemic heart disease and other diseases of the circulatory system: Secondary | ICD-10-CM | POA: Diagnosis not present

## 2015-11-22 DIAGNOSIS — I10 Essential (primary) hypertension: Secondary | ICD-10-CM | POA: Diagnosis present

## 2015-11-22 NOTE — Progress Notes (Signed)
Patient transferred from [facility] via EMT into room 3E05. Family also arrived minutes later and are at the bedside. VSS. Called and confirmed placement of telemetry leads with CMT. Oriented patient and family to the room and room equipment and Heart Failure floor. Will continue to monitor patient to end of shift.

## 2015-11-23 DIAGNOSIS — I5023 Acute on chronic systolic (congestive) heart failure: Secondary | ICD-10-CM

## 2015-11-23 DIAGNOSIS — I255 Ischemic cardiomyopathy: Secondary | ICD-10-CM

## 2015-11-23 DIAGNOSIS — I5041 Acute combined systolic (congestive) and diastolic (congestive) heart failure: Secondary | ICD-10-CM | POA: Diagnosis present

## 2015-11-23 DIAGNOSIS — Q278 Other specified congenital malformations of peripheral vascular system: Secondary | ICD-10-CM

## 2015-11-23 DIAGNOSIS — Z8673 Personal history of transient ischemic attack (TIA), and cerebral infarction without residual deficits: Secondary | ICD-10-CM

## 2015-11-23 DIAGNOSIS — I248 Other forms of acute ischemic heart disease: Secondary | ICD-10-CM

## 2015-11-23 DIAGNOSIS — I25708 Atherosclerosis of coronary artery bypass graft(s), unspecified, with other forms of angina pectoris: Secondary | ICD-10-CM

## 2015-11-23 LAB — MAGNESIUM: MAGNESIUM: 1.8 mg/dL (ref 1.7–2.4)

## 2015-11-23 MED ORDER — FUROSEMIDE 10 MG/ML IJ SOLN
40.0000 mg | Freq: Two times a day (BID) | INTRAMUSCULAR | Status: DC
  Administered 2015-11-23 – 2015-11-24 (×3): 40 mg via INTRAVENOUS
  Filled 2015-11-23 (×3): qty 4

## 2015-11-23 MED ORDER — ENOXAPARIN SODIUM 40 MG/0.4ML ~~LOC~~ SOLN
40.0000 mg | SUBCUTANEOUS | Status: DC
  Administered 2015-11-23 – 2015-11-24 (×2): 40 mg via SUBCUTANEOUS
  Filled 2015-11-23 (×2): qty 0.4

## 2015-11-23 MED ORDER — FLUOXETINE HCL 20 MG PO CAPS
20.0000 mg | ORAL_CAPSULE | Freq: Every day | ORAL | Status: DC
Start: 2015-11-23 — End: 2015-11-29
  Administered 2015-11-23 – 2015-11-29 (×7): 20 mg via ORAL
  Filled 2015-11-23 (×9): qty 1

## 2015-11-23 MED ORDER — ISOSORBIDE MONONITRATE ER 30 MG PO TB24
30.0000 mg | ORAL_TABLET | Freq: Every day | ORAL | Status: DC
  Administered 2015-11-23: 30 mg via ORAL
  Filled 2015-11-23 (×3): qty 1

## 2015-11-23 MED ORDER — HYDRALAZINE HCL 10 MG PO TABS
10.0000 mg | ORAL_TABLET | Freq: Three times a day (TID) | ORAL | Status: DC
  Administered 2015-11-23 – 2015-11-24 (×5): 10 mg via ORAL
  Filled 2015-11-23 (×5): qty 1

## 2015-11-23 MED ORDER — LEVOTHYROXINE SODIUM 50 MCG PO TABS
50.0000 ug | ORAL_TABLET | Freq: Every day | ORAL | Status: DC
  Administered 2015-11-23 – 2015-11-27 (×5): 50 ug via ORAL
  Filled 2015-11-23 (×5): qty 1

## 2015-11-23 MED ORDER — POTASSIUM CHLORIDE CRYS ER 20 MEQ PO TBCR
20.0000 meq | EXTENDED_RELEASE_TABLET | Freq: Every day | ORAL | Status: DC
  Administered 2015-11-23 – 2015-11-29 (×7): 20 meq via ORAL
  Filled 2015-11-23 (×8): qty 1

## 2015-11-23 MED ORDER — CLOPIDOGREL BISULFATE 75 MG PO TABS
75.0000 mg | ORAL_TABLET | Freq: Every day | ORAL | Status: DC
  Administered 2015-11-23 – 2015-11-24 (×2): 75 mg via ORAL
  Filled 2015-11-23 (×2): qty 1

## 2015-11-23 MED ORDER — ALPRAZOLAM 0.5 MG PO TABS
0.5000 mg | ORAL_TABLET | Freq: Two times a day (BID) | ORAL | Status: DC | PRN
  Administered 2015-11-23 – 2015-11-28 (×7): 0.5 mg via ORAL
  Filled 2015-11-23 (×7): qty 1

## 2015-11-23 MED ORDER — ASPIRIN EC 81 MG PO TBEC
81.0000 mg | DELAYED_RELEASE_TABLET | Freq: Every day | ORAL | Status: DC
  Administered 2015-11-23 – 2015-11-29 (×7): 81 mg via ORAL
  Filled 2015-11-23 (×9): qty 1

## 2015-11-23 MED ORDER — ACETAMINOPHEN 325 MG PO TABS: 650.0000 mg | ORAL_TABLET | ORAL | Status: DC | PRN

## 2015-11-23 MED ORDER — PRAVASTATIN SODIUM 10 MG PO TABS
10.0000 mg | ORAL_TABLET | Freq: Every day | ORAL | Status: DC
  Administered 2015-11-23 – 2015-11-24 (×2): 10 mg via ORAL
  Filled 2015-11-23 (×2): qty 1

## 2015-11-23 MED ORDER — ONDANSETRON HCL 4 MG/2ML IJ SOLN: 4.0000 mg | Freq: Four times a day (QID) | INTRAMUSCULAR | Status: DC | PRN

## 2015-11-23 MED ORDER — SODIUM CHLORIDE 0.9 % IJ SOLN
3.0000 mL | Freq: Two times a day (BID) | INTRAMUSCULAR | Status: DC
  Administered 2015-11-23 – 2015-11-26 (×5): 3 mL via INTRAVENOUS

## 2015-11-23 MED ORDER — SODIUM CHLORIDE 0.9 % IV SOLN: 250.0000 mL | INTRAVENOUS | Status: DC | PRN

## 2015-11-23 MED ORDER — METOPROLOL SUCCINATE ER 25 MG PO TB24
25.0000 mg | ORAL_TABLET | Freq: Two times a day (BID) | ORAL | Status: DC
  Administered 2015-11-23 – 2015-11-24 (×3): 25 mg via ORAL
  Filled 2015-11-23 (×4): qty 1

## 2015-11-23 MED ORDER — SPIRONOLACTONE 25 MG PO TABS
12.5000 mg | ORAL_TABLET | Freq: Every day | ORAL | Status: DC
  Administered 2015-11-23 – 2015-11-24 (×2): 12.5 mg via ORAL
  Filled 2015-11-23 (×2): qty 1

## 2015-11-23 MED ORDER — SODIUM CHLORIDE 0.9 % IJ SOLN: 3.0000 mL | INTRAMUSCULAR | Status: DC | PRN

## 2015-11-23 MED ORDER — NITROGLYCERIN 0.4 MG SL SUBL: 0.4000 mg | SUBLINGUAL_TABLET | SUBLINGUAL | Status: DC | PRN

## 2015-11-23 NOTE — Progress Notes (Signed)
SUBJECTIVE: Says "breathing has improved slightly". No chest pain or leg swelling. Abdominal distention has gone down per pt and wife. They have several questions regarding meds, echo, and desire not to be discharged prematurely.     Intake/Output Summary (Last 24 hours) at 11/23/15 0824 Last data filed at 11/23/15 0746  Gross per 24 hour  Intake    480 ml  Output   1375 ml  Net   -895 ml    Current Facility-Administered Medications  Medication Dose Route Frequency Provider Last Rate Last Dose  . 0.9 %  sodium chloride infusion  250 mL Intravenous PRN Wandra Mannan, MD      . acetaminophen (TYLENOL) tablet 650 mg  650 mg Oral Q4H PRN Wandra Mannan, MD      . ALPRAZolam Duanne Moron) tablet 0.5 mg  0.5 mg Oral BID PRN Wandra Mannan, MD   0.5 mg at 11/23/15 0246  . aspirin EC tablet 81 mg  81 mg Oral Daily Wandra Mannan, MD      . clopidogrel (PLAVIX) tablet 75 mg  75 mg Oral Daily Wandra Mannan, MD      . enoxaparin (LOVENOX) injection 40 mg  40 mg Subcutaneous Q24H Wandra Mannan, MD      . FLUoxetine (PROZAC) tablet 20 mg  20 mg Oral Daily Wandra Mannan, MD      . furosemide (LASIX) injection 40 mg  40 mg Intravenous Q12H Wandra Mannan, MD   40 mg at 11/23/15 0300  . hydrALAZINE (APRESOLINE) tablet 10 mg  10 mg Oral 3 times per day Wandra Mannan, MD   10 mg at 11/23/15 0533  . isosorbide mononitrate (IMDUR) 24 hr tablet 30 mg  30 mg Oral Daily Wandra Mannan, MD      . levothyroxine (SYNTHROID, LEVOTHROID) tablet 50 mcg  50 mcg Oral QAC breakfast Wandra Mannan, MD   50 mcg at 11/23/15 0533  . metoprolol succinate (TOPROL-XL) 24 hr tablet 25 mg  25 mg Oral BID Wandra Mannan, MD   25 mg at 11/23/15 0246  . nitroGLYCERIN (NITROSTAT) SL tablet 0.4 mg  0.4 mg Sublingual Q5 min PRN Wandra Mannan, MD      . ondansetron (ZOFRAN) injection 4 mg  4 mg Intravenous Q6H PRN Wandra Mannan, MD      . potassium chloride SA (K-DUR,KLOR-CON) CR tablet 20 mEq  20 mEq Oral Daily Wandra Mannan, MD      . pravastatin (PRAVACHOL) tablet 10 mg  10  mg Oral Daily Wandra Mannan, MD      . sodium chloride 0.9 % injection 3 mL  3 mL Intravenous Q12H Wandra Mannan, MD   3 mL at 11/23/15 0247  . sodium chloride 0.9 % injection 3 mL  3 mL Intravenous PRN Wandra Mannan, MD      . spironolactone (ALDACTONE) tablet 12.5 mg  12.5 mg Oral Daily Wandra Mannan, MD        Filed Vitals:   11/23/15 0008 11/23/15 0247 11/23/15 0402 11/23/15 0737  BP: 119/77 103/65 111/77 99/67  Pulse: 85 75 84 77  Temp: 97.4 F (36.3 C)   97.4 F (36.3 C)  TempSrc: Oral   Oral  Resp: 20  16 16   Height: 5\' 4"  (1.626 m)     Weight: 142 lb 12.8 oz (64.774 kg)  137 lb 11.2 oz (62.46 kg)   SpO2: 100%  96% 99%    PHYSICAL EXAM General: NAD HEENT: Normal. Neck: JVP 10 cm H2O Lungs:  Clear to auscultation bilaterally with normal respiratory effort. CV: Nondisplaced PMI.  Regular rate and rhythm, normal S1/S2, no S3/S4, no murmur.  No pretibial edema.    Abdomen: Soft, nontender, mild distention.  Neurologic: Alert and oriented x 3.  Psych: Normal affect. Musculoskeletal: Normal range of motion. No gross deformities. Extremities: No clubbing or cyanosis.   TELEMETRY: Reviewed telemetry pt in sinus rhythm.  LABS: Basic Metabolic Panel:  Recent Labs  11/23/15 0236  MG 1.8   Liver Function Tests: No results for input(s): AST, ALT, ALKPHOS, BILITOT, PROT, ALBUMIN in the last 72 hours. No results for input(s): LIPASE, AMYLASE in the last 72 hours. CBC: No results for input(s): WBC, NEUTROABS, HGB, HCT, MCV, PLT in the last 72 hours. Cardiac Enzymes: No results for input(s): CKTOTAL, CKMB, CKMBINDEX, TROPONINI in the last 72 hours. BNP: Invalid input(s): POCBNP D-Dimer: No results for input(s): DDIMER in the last 72 hours. Hemoglobin A1C: No results for input(s): HGBA1C in the last 72 hours. Fasting Lipid Panel: No results for input(s): CHOL, HDL, LDLCALC, TRIG, CHOLHDL, LDLDIRECT in the last 72 hours. Thyroid Function Tests: No results for input(s): TSH, T4TOTAL,  T3FREE, THYROIDAB in the last 72 hours.  Invalid input(s): FREET3 Anemia Panel: No results for input(s): VITAMINB12, FOLATE, FERRITIN, TIBC, IRON, RETICCTPCT in the last 72 hours.  RADIOLOGY: Dg Chest 2 View  10/30/2015  CLINICAL DATA:  Chest pain and shortness of breath beginning yesterday. EXAM: CHEST  2 VIEW COMPARISON:  Chest radiograph September 26, 2015 FINDINGS: Cardiac silhouette is normal in size, status post median sternotomy for CABG. No pleural effusion or focal consolidation. No pneumothorax. Soft tissue planes included osseous structures are nonsuspicious. Mild degenerative change of thoracic spine. IMPRESSION: No acute cardiopulmonary process. Electronically Signed   By: Elon Alas M.D.   On: 10/30/2015 01:10      ASSESSMENT AND PLAN: . Acute on chronic systolic HF in the setting of ischemic cardiomyopathy and chronic systolic HF - No cardiogenic shock  - No unstable arrhythmia  - Nearly 1L output on Lasix IV 40 mg bid which I will continue - Hydralazine 10 mg tid added by fellow on call - Continue Toprol-XL and Imdur - Repeat echo -- if EF remains <35% 90 days after revascularization (CABG) on optimal medical therapy then will consider ICD for primary prevention  2. CAD, one stent in 1990's [place], now s/p 5V CABG 07/2015 here at [facility] - Minimal troponin elevation is likely due to acute HF and not ACS    3. History of CVA  - on ASA and Plavix  4. Aberrant right subclavian artery by CTA   5. History of angioedema with lisinopril recently    Kate Sable, M.D., F.A.C.C.

## 2015-11-23 NOTE — H&P (Signed)
PCP: Dr. Cleda Mccreedy  Cardiologist: Dr. Kate Sable  CC: SOB   HPI: 64 yo man with CAD, s/p PCIx1 in [id number], known LV systolic dysfunction since, 5V CABG (LIMA-LAD, SVG-RI1-RI2, SVG-OM2-OM3) on 08/18/2015 by Dr. Roxan Hockey, LVEF 35-45% by LV angiography and 45% by echo in 07/2015, presented to [facility] with progressive dyspnea on exertion, orthopnea, fatigue and lethargy for the past few weeks. Denies angina, syncope, palpitations, diaphoresis, PND. He was seen in the ER on 10/30 (BNP 66) and cardiology office the same day and his Perindopril was changed to Lisinopril (developed angioedema afterwards with lisinopril). He was started on Imdur 30 mg qd and lipitor was reduced from 40 qd to 20 mg po qd due to muscle cramps on 11/4 and later to Pravastatin and low dose lasix 20 mg qd every other day was started. He atenolol was switched to toprol xl on 11/10 and spironolactone was reduced to 12.5 mg qd.   Labs at [facility]: BUN/Cr  16/1.1, H/H 13/42, PLT 320, WBC 8.9, TnT 0.14, NT pro BNP 2346, Na 135, K 4, PO2 33 on RA. Reportedly he had good response to IV lasix earlier today in the ER.   CTA chest -- no PE but bilateral moderate pleural effusions, aberrant right subclavian artery    Review of Systems:  10 systems reviewed unremarkable except as noted in HPI    Past Medical History  Diagnosis Date  . Hypertension   . Obesity   . Dyslipidemia   . Hypothyroidism   . Pulmonary valve disorders   . Mitral valve disorders   . Heart disease, unspecified   . Heart attack ([place]) 09/24/94  . Arthritis   . Stroke ([place])   . H/O: CVA (cerebrovascular accident), 06/22/15 Rt frontal rec'd TPA 08/14/2015   No current facility-administered medications on file prior to encounter.   Current Outpatient Prescriptions on File Prior to Encounter  Medication Sig Dispense Refill  . acetaminophen (TYLENOL) 500 MG tablet Take 1,000 mg by mouth every 6 (six) hours as needed.    .  ALPRAZolam (XANAX) 0.5 MG tablet Take 1 tablet by mouth at bedtime.  0  . aspirin 81 MG EC tablet Take 81 mg by mouth daily.      . clopidogrel (PLAVIX) 75 MG tablet Take 1 tablet (75 mg total) by mouth daily. 90 tablet 6  . FLUoxetine (PROZAC) 20 MG tablet Take 1 tablet by mouth daily.  0  . folic acid (PX FOLIC ACID) A999333 MCG tablet Take 400 mcg by mouth daily.      . furosemide (LASIX) 20 MG tablet Take One Tablet  2 Times Weekly 24 tablet 3  . isosorbide mononitrate (IMDUR) 30 MG 24 hr tablet Take 30 mg by mouth daily. Added by dr Sherrie Sport    . levothyroxine (SYNTHROID, LEVOTHROID) 50 MCG tablet Take 50 mcg by mouth daily before breakfast. On M-F    . levothyroxine (SYNTHROID, LEVOTHROID) 75 MCG tablet Take 75 mcg by mouth daily before breakfast. Take 1 tablet only on Sat & Sun    . lisinopril (PRINIVIL,ZESTRIL) 2.5 MG tablet Take 1 tablet (2.5 mg total) by mouth daily. 90 tablet 3  . metoprolol succinate (TOPROL XL) 25 MG 24 hr tablet Take 1 tablet (25 mg total) by mouth 2 (two) times daily. 60 tablet 6  . Multiple Vitamins-Minerals (PRESERVISION AREDS 2 PO) Take 1 tablet by mouth every other day.    . nitroGLYCERIN (NITROSTAT) 0.4 MG SL tablet Place 0.4 mg  under the tongue every 5 (five) minutes as needed for chest pain.    . pravastatin (PRAVACHOL) 20 MG tablet Take 1 tablet by mouth daily.  0  . spironolactone (ALDACTONE) 25 MG tablet Take 0.5 tablets (12.5 mg total) by mouth daily.    . vitamin B-12 (CYANOCOBALAMIN) 500 MCG tablet Take 500 mcg by mouth daily.    . vitamin E 200 UNIT capsule Take 200 Units by mouth daily.       Allergies  Allergen Reactions  . Lisinopril Other (See Comments)    Angioedema    Social History   Social History  . Marital Status: Married    Spouse Name: N/A  . Number of Children: N/A  . Years of Education: N/A   Occupational History  . Not on file.   Social History Main Topics  . Smoking status: Never Smoker   . Smokeless tobacco: Never Used    . Alcohol Use: No  . Drug Use: No  . Sexual Activity: Not on file   Other Topics Concern  . Not on file   Social History Narrative   Retired   Has 3 children   Married to [employer]   Regular exercise--yes 5 days/week 67min    Family History  Problem Relation Age of Onset  . Kidney disease Father     died age 57  . Heart disease Mother     had angioplasty    PHYSICAL EXAM: Filed Vitals:   11/23/15 0008  BP: 119/77  Pulse: 85  Temp: 97.4 F (36.3 C)  Resp: 20   General:  Anxious, ill appearing HEENT: normal Neck: supple. JVD~10 cm. Carotids 2+ bilat; no bruits. No lymphadenopathy or thryomegaly appreciated. Cor: PMI displaced. Regular rate & rhythm. No rubs, gallops or murmurs. Lungs: clear Abdomen: soft, nontender, nondistended. No hepatosplenomegaly. No bruits or masses. Good bowel sounds. Extremities: no cyanosis, clubbing, rash, edema Neuro: alert & oriented x 3, cranial nerves grossly intact. moves all 4 extremities w/o difficulty. Affect pleasant.  ECG: NSR, normal AV conduction, poor R wave progression in precordium  No results found for this or any previous visit (from the past 24 hour(s)). No results found.   Cath 08/14/2015:  1. Prox LAD lesion, 100% stenosed. 2. LM lesion, 99% stenosed. 3. Mid Cx lesion, 90% stenosed. 4. There is moderate to severe left ventricular systolic dysfunction. CONCLUSIONS: 1. Chronic total occlusion of the proximal LAD with right-to-left collaterals 2. Critical left main stenosis extending into the left circumflex and ramus intermedius 3. Patent RCA supplying collaterals to the LAD 4. Severe mid-circumflex stenosis 5. Moderate segmental LV systolic dysfunction with LVEF 35-40%  Echo 08/15/2015:  Normal LV size with mild LV hypertrophy. EF 45% with wall motion abnormalities as noted above. Normal RV size and systolic function. Mild MR.     ASSESSMENT:  1. Acute on chronic systolic HF in the setting of  ischemic cardiomyopathy and chronic systolic HF - No cardiogenic shock  - No unstable arrhythmia  - No cardiac rhythm device in place    2. CAD, one stent in 1990's [place], now s/p 5V CABG 07/2015 here at [facility] - Mild Trop elevation is likely due to acute HF and not ACS    3. History of CVA  - on ASA and Plavix   4. Aberrant right subclavian artery by CTA   5. History of angioedema with Lisinopril recently    PLAN/DISCUSSION:  Admit to cardiology  Gentle diuresis, monitor response and lytes Continue  Imdur 30 mg qd and start Hydralazine 10 mg po tid -- will titrate up these as tolerated  Repeat echo -- if EF remains <35% 90 days after revascularization (CABG) on optimal medical therapy then will consider ICD for primary prevention  Please refer to orders for other details.     Wandra Mannan, MD Cardiology

## 2015-11-24 ENCOUNTER — Encounter (HOSPITAL_COMMUNITY): Payer: BLUE CROSS/BLUE SHIELD

## 2015-11-24 ENCOUNTER — Inpatient Hospital Stay (HOSPITAL_COMMUNITY): Payer: BLUE CROSS/BLUE SHIELD

## 2015-11-24 ENCOUNTER — Encounter (HOSPITAL_COMMUNITY)
Admission: AD | Disposition: A | Discharge status: Home / Self Care | Payer: Self-pay | Source: Other Acute Inpatient Hospital | Attending: Internal Medicine

## 2015-11-24 DIAGNOSIS — I509 Heart failure, unspecified: Secondary | ICD-10-CM

## 2015-11-24 DIAGNOSIS — I5021 Acute systolic (congestive) heart failure: Secondary | ICD-10-CM

## 2015-11-24 DIAGNOSIS — R570 Cardiogenic shock: Secondary | ICD-10-CM

## 2015-11-24 DIAGNOSIS — I25110 Atherosclerotic heart disease of native coronary artery with unstable angina pectoris: Secondary | ICD-10-CM

## 2015-11-24 DIAGNOSIS — I249 Acute ischemic heart disease, unspecified: Secondary | ICD-10-CM | POA: Diagnosis not present

## 2015-11-24 HISTORY — PX: CARDIAC CATHETERIZATION: SHX172

## 2015-11-24 LAB — BASIC METABOLIC PANEL
Anion gap: 10 (ref 5–15)
BUN: 21 mg/dL — ABNORMAL HIGH (ref 6–20)
CALCIUM: 9.3 mg/dL (ref 8.9–10.3)
CO2: 25 mmol/L (ref 22–32)
CREATININE: 1.27 mg/dL — AB (ref 0.61–1.24)
Chloride: 99 mmol/L — ABNORMAL LOW (ref 101–111)
GFR, EST NON AFRICAN AMERICAN: 59 mL/min — AB (ref >60)
Glucose, Bld: 76 mg/dL (ref 65–99)
Potassium: 3.3 mmol/L — ABNORMAL LOW (ref 3.5–5.1)
SODIUM: 134 mmol/L — AB (ref 135–145)

## 2015-11-24 SURGERY — LEFT HEART CATH AND CORS/GRAFTS ANGIOGRAPHY
Anesthesia: LOCAL

## 2015-11-24 MED ORDER — FUROSEMIDE 10 MG/ML IJ SOLN
INTRAMUSCULAR | Status: AC
  Filled 2015-11-24: qty 4

## 2015-11-24 MED ORDER — METOPROLOL SUCCINATE ER 25 MG PO TB24
12.5000 mg | ORAL_TABLET | Freq: Two times a day (BID) | ORAL | Status: DC
  Administered 2015-11-24 – 2015-11-29 (×10): 12.5 mg via ORAL
  Filled 2015-11-24 (×10): qty 1

## 2015-11-24 MED ORDER — PRAVASTATIN SODIUM 40 MG PO TABS
40.0000 mg | ORAL_TABLET | Freq: Every day | ORAL | Status: DC
Start: 2015-11-25 — End: 2015-11-26
  Administered 2015-11-25: 40 mg via ORAL
  Administered 2015-11-26: 20 mg via ORAL
  Filled 2015-11-24 (×2): qty 1

## 2015-11-24 MED ORDER — ASPIRIN 81 MG PO CHEW: 81.0000 mg | CHEWABLE_TABLET | Freq: Every day | ORAL | Status: DC

## 2015-11-24 MED ORDER — SODIUM CHLORIDE 0.9 % IJ SOLN
3.0000 mL | Freq: Two times a day (BID) | INTRAMUSCULAR | Status: DC
  Administered 2015-11-25 – 2015-11-26 (×2): 3 mL via INTRAVENOUS

## 2015-11-24 MED ORDER — NITROGLYCERIN 1 MG/10 ML FOR IR/CATH LAB
INTRA_ARTERIAL | Status: DC | PRN
  Administered 2015-11-24: 21:00:00

## 2015-11-24 MED ORDER — FUROSEMIDE 10 MG/ML IJ SOLN
INTRAMUSCULAR | Status: DC | PRN
  Administered 2015-11-24 (×2): 40 mg via INTRAVENOUS

## 2015-11-24 MED ORDER — FENTANYL CITRATE (PF) 100 MCG/2ML IJ SOLN
INTRAMUSCULAR | Status: AC
  Filled 2015-11-24: qty 2

## 2015-11-24 MED ORDER — ISOSORBIDE MONONITRATE ER 30 MG PO TB24: 30.0000 mg | ORAL_TABLET | ORAL | Status: DC

## 2015-11-24 MED ORDER — POTASSIUM CHLORIDE CRYS ER 20 MEQ PO TBCR
40.0000 meq | EXTENDED_RELEASE_TABLET | Freq: Once | ORAL | Status: AC
  Administered 2015-11-24: 40 meq via ORAL
  Filled 2015-11-24: qty 2

## 2015-11-24 MED ORDER — BIVALIRUDIN 250 MG IV SOLR
INTRAVENOUS | Status: AC
  Filled 2015-11-24: qty 250

## 2015-11-24 MED ORDER — SODIUM CHLORIDE 0.9 % IV SOLN: INTRAVENOUS | Status: DC

## 2015-11-24 MED ORDER — FENTANYL CITRATE (PF) 100 MCG/2ML IJ SOLN
INTRAMUSCULAR | Status: DC | PRN
  Administered 2015-11-24: 25 ug via INTRAVENOUS

## 2015-11-24 MED ORDER — IOHEXOL 350 MG/ML SOLN
INTRAVENOUS | Status: DC | PRN
  Administered 2015-11-24: 280 mL via INTRA_ARTERIAL

## 2015-11-24 MED ORDER — ACETAMINOPHEN 325 MG PO TABS
650.0000 mg | ORAL_TABLET | ORAL | Status: DC | PRN
  Administered 2015-11-25 (×2): 650 mg via ORAL
  Filled 2015-11-24 (×2): qty 2

## 2015-11-24 MED ORDER — ONDANSETRON HCL 4 MG/2ML IJ SOLN: 4.0000 mg | Freq: Four times a day (QID) | INTRAMUSCULAR | Status: DC | PRN

## 2015-11-24 MED ORDER — MORPHINE SULFATE (PF) 10 MG/ML IV SOLN
INTRAVENOUS | Status: DC | PRN
  Administered 2015-11-24: 2 mg via INTRAVENOUS

## 2015-11-24 MED ORDER — SODIUM CHLORIDE 0.9 % IV BOLUS (SEPSIS): 250.0000 mL | Freq: Once | INTRAVENOUS | Status: AC

## 2015-11-24 MED ORDER — NITROGLYCERIN 1 MG/10 ML FOR IR/CATH LAB
INTRA_ARTERIAL | Status: DC | PRN
  Administered 2015-11-24 (×2): 100 ug via INTRACORONARY
  Administered 2015-11-24: 200 ug via INTRACORONARY

## 2015-11-24 MED ORDER — BIVALIRUDIN BOLUS VIA INFUSION - CUPID
INTRAVENOUS | Status: DC | PRN
  Administered 2015-11-24: 47.4 mg via INTRAVENOUS

## 2015-11-24 MED ORDER — LIDOCAINE HCL (PF) 1 % IJ SOLN
INTRAMUSCULAR | Status: AC
  Filled 2015-11-24: qty 30

## 2015-11-24 MED ORDER — CLOPIDOGREL BISULFATE 300 MG PO TABS
ORAL_TABLET | ORAL | Status: DC | PRN
  Administered 2015-11-24: 300 mg via ORAL

## 2015-11-24 MED ORDER — SODIUM CHLORIDE 0.9 % IJ SOLN: 3.0000 mL | INTRAMUSCULAR | Status: DC | PRN

## 2015-11-24 MED ORDER — CLOPIDOGREL BISULFATE 300 MG PO TABS
ORAL_TABLET | ORAL | Status: AC
  Filled 2015-11-24: qty 1

## 2015-11-24 MED ORDER — MIDAZOLAM HCL 2 MG/2ML IJ SOLN
INTRAMUSCULAR | Status: DC | PRN
  Administered 2015-11-24: 1 mg via INTRAVENOUS

## 2015-11-24 MED ORDER — SODIUM CHLORIDE 0.9 % IV SOLN
INTRAVENOUS | Status: DC
  Administered 2015-11-25: 08:00:00 via INTRAVENOUS

## 2015-11-24 MED ORDER — CLOPIDOGREL BISULFATE 75 MG PO TABS
75.0000 mg | ORAL_TABLET | Freq: Every day | ORAL | Status: DC
  Administered 2015-11-25 – 2015-11-29 (×5): 75 mg via ORAL
  Filled 2015-11-24 (×5): qty 1

## 2015-11-24 MED ORDER — MORPHINE SULFATE (PF) 10 MG/ML IV SOLN
INTRAVENOUS | Status: AC
  Filled 2015-11-24: qty 1

## 2015-11-24 MED ORDER — SODIUM CHLORIDE 0.9 % IV SOLN: 250.0000 mL | INTRAVENOUS | Status: DC | PRN

## 2015-11-24 MED ORDER — BIVALIRUDIN 250 MG IV SOLR
250.0000 mg | INTRAVENOUS | Status: DC | PRN
  Administered 2015-11-24: 1.75 mg/kg/h via INTRAVENOUS

## 2015-11-24 MED ORDER — HEPARIN (PORCINE) IN NACL 100-0.45 UNIT/ML-% IJ SOLN
650.0000 [IU]/h | INTRAMUSCULAR | Status: DC
Start: 2015-11-24 — End: 2015-11-25
  Administered 2015-11-24: 650 [IU]/h via INTRAVENOUS
  Filled 2015-11-24: qty 250

## 2015-11-24 MED ORDER — MIDAZOLAM HCL 2 MG/2ML IJ SOLN
INTRAMUSCULAR | Status: AC
  Filled 2015-11-24: qty 2

## 2015-11-24 MED ORDER — NITROGLYCERIN 1 MG/10 ML FOR IR/CATH LAB
INTRA_ARTERIAL | Status: AC
  Filled 2015-11-24: qty 10

## 2015-11-24 SURGICAL SUPPLY — 29 items
BALLN EMERGE MR PUSH 1.5X12 (BALLOONS) ×3
BALLN EUPHORA RX 2.0X12 (BALLOONS) ×3
BALLN LINEAR 7.5FR IABP 34CC (BALLOONS) ×3
BALLN ~~LOC~~ EUPHORA RX 3.25X20 (BALLOONS) ×3
BALLN ~~LOC~~ EUPHORA RX 3.75X8 (BALLOONS) ×3
BALLOON EMERGE MR PUSH 1.5X12 (BALLOONS) ×2 IMPLANT
BALLOON EUPHORA RX 2.0X12 (BALLOONS) ×2 IMPLANT
BALLOON LINEAR 7.5FR IABP 34CC (BALLOONS) ×2 IMPLANT
BALLOON ~~LOC~~ EUPHORA RX 3.25X20 (BALLOONS) ×2 IMPLANT
BALLOON ~~LOC~~ EUPHORA RX 3.75X8 (BALLOONS) ×2 IMPLANT
CATH INFINITI 5 FR IM (CATHETERS) ×3 IMPLANT
CATH INFINITI 5FR MULTPACK ANG (CATHETERS) ×3 IMPLANT
CATH SITESEER 5F NTR (CATHETERS) ×3 IMPLANT
CATH VISTA GUIDE 6FR XB3.5 (CATHETERS) ×3 IMPLANT
DEVICE SECURE STATLOCK IABP (MISCELLANEOUS) ×6 IMPLANT
ELECT DEFIB PAD ADLT CADENCE (PAD) ×3 IMPLANT
KIT ENCORE 26 ADVANTAGE (KITS) ×3 IMPLANT
KIT HEART LEFT (KITS) ×3 IMPLANT
PACK CARDIAC CATHETERIZATION (CUSTOM PROCEDURE TRAY) ×3 IMPLANT
SHEATH PINNACLE 5F 10CM (SHEATH) ×3 IMPLANT
SHEATH PINNACLE 6F 10CM (SHEATH) ×6 IMPLANT
STENT RESOLUTE INTEG 2.5X30 (Permanent Stent) ×3 IMPLANT
STENT RESOLUTE INTEG 3.0X22 (Permanent Stent) ×3 IMPLANT
STENT RESOLUTE INTEG 3.5X18 (Permanent Stent) ×3 IMPLANT
SYR MEDRAD MARK V 150ML (SYRINGE) ×3 IMPLANT
TRANSDUCER W/STOPCOCK (MISCELLANEOUS) ×3 IMPLANT
TUBING CIL FLEX 10 FLL-RA (TUBING) ×3 IMPLANT
WIRE EMERALD 3MM-J .035X150CM (WIRE) ×3 IMPLANT
WIRE PT2 MS 185 (WIRE) ×6 IMPLANT

## 2015-11-24 NOTE — Progress Notes (Addendum)
Pt's b/p 80/50 to right arm and pulse = 100.  He is diaphoretic.  Rapid response called for assessment.  When RR nurse arrived to room, pt c/o mid-sternal chest pain # 3-4.  Stat 12-Lead ordered and in progress.  All information except new onset of chest pain sent via text page to [place], [place] with Cr. Crenshaw.  Will send 2nd text with chest pain information.  Denise, Rapid Response RN recommended starting NS bolus at 125cc/hr and is infusing per Standing Order protocol.

## 2015-11-24 NOTE — Significant Event (Signed)
Rapid Response Event Note  Overview: Time Called: [id number] Arrival Time: 1652 Event Type: Cardiac, Hypotension  Initial Focused Assessment:  Called by primary RN for patient with hypotension 80/57, HR 100.  Patient is diaphoretic and is having substernal CP 4/10 with pain radiating to left hand on my arrival.  Patient alert and lying in bed and states this started all of a sudden.  Skin is diaphoretic, with mild increased SOB   Interventions:  MD paged.  250 CC  NS Bolus started at 125/hr per protocol.  EKG being obtained.  Dunn, PA returned phone call order for NS to increase to 250cc/hr.  EKG with changes from this am EKG. Dunn, PA at the bedside.  Patient states starting to feel better, not diaphoretic at this time, still with some slight chest discomfort 89/69, 101, BP rechecked 101/74, HR 98.  Dr. Tamala Julian at bedside   Event Summary:  Patient to go to Cath LAb.  Rn to call if assistance needed   at      at          [facility], [place]

## 2015-11-24 NOTE — Progress Notes (Signed)
Information given to Sharrell Ku, PA via return phone call to text page, regarding chest pain, low b/p.  250 ml NS bolus ordered and is infusing. Currently c/o radiation of pain to left hand without arm involvement.

## 2015-11-24 NOTE — Progress Notes (Signed)
Dr. Tamala Julian to room to assess and ST depression noted on 12 Lead EKG.  NS bolus of 250 ml infusing and new order to infuse NS at 129ml/hr after bolus in. Patient and family aware of plan

## 2015-11-24 NOTE — Progress Notes (Signed)
Orthopedic Tech Progress Note Patient Details:  Adam Shelton [birth year] [id number]  Ortho Devices Type of Ortho Device: Knee Immobilizer Ortho Device/Splint Location: RLE Ortho Device/Splint Interventions: Ordered, Application   Braulio Bosch 11/24/2015, 9:28 PM

## 2015-11-24 NOTE — Progress Notes (Signed)
Patient c/o increased shortness of breath and chest heaviness (same as he has had - not new).  VSs = 97.7, 107/73, 82, 18, 99% RA.  Dr. Stanford Breed to room for rounds and aware of patient's complaints.  12 Lead EKG in rogress.  Skin warm and dry.  Denies nausea.  Echo tech at room to perform bedside 2D echo.  Will take completed EKG to Dr. Stanford Breed when completed.

## 2015-11-24 NOTE — Progress Notes (Signed)
SUBJECTIVE: Patient with mild increased dyspnea this AM; also with mild chest pressure that is improving compared to admission     Intake/Output Summary (Last 24 hours) at 11/24/15 0815 Last data filed at 11/24/15 0754  Gross per 24 hour  Intake   1343 ml  Output   1450 ml  Net   -107 ml    Current Facility-Administered Medications  Medication Dose Route Frequency Provider Last Rate Last Dose  . 0.9 %  sodium chloride infusion  250 mL Intravenous PRN Wandra Mannan, MD      . acetaminophen (TYLENOL) tablet 650 mg  650 mg Oral Q4H PRN Wandra Mannan, MD      . ALPRAZolam Duanne Moron) tablet 0.5 mg  0.5 mg Oral BID PRN Wandra Mannan, MD   0.5 mg at 11/23/15 2316  . aspirin EC tablet 81 mg  81 mg Oral Daily Wandra Mannan, MD   81 mg at 11/23/15 0935  . clopidogrel (PLAVIX) tablet 75 mg  75 mg Oral Daily Wandra Mannan, MD   75 mg at 11/23/15 0934  . enoxaparin (LOVENOX) injection 40 mg  40 mg Subcutaneous Q24H Wandra Mannan, MD   40 mg at 11/23/15 1600  . FLUoxetine (PROZAC) capsule 20 mg  20 mg Oral Daily Wandra Mannan, MD   20 mg at 11/23/15 0935  . furosemide (LASIX) injection 40 mg  40 mg Intravenous Q12H Wandra Mannan, MD   40 mg at 11/24/15 [id number]  . hydrALAZINE (APRESOLINE) tablet 10 mg  10 mg Oral 3 times per day Wandra Mannan, MD   10 mg at 11/24/15 [id number]  . isosorbide mononitrate (IMDUR) 24 hr tablet 30 mg  30 mg Oral Daily Wandra Mannan, MD   Stopped at 11/23/15 1700  . levothyroxine (SYNTHROID, LEVOTHROID) tablet 50 mcg  50 mcg Oral QAC breakfast Wandra Mannan, MD   50 mcg at 11/24/15 [id number]  . metoprolol succinate (TOPROL-XL) 24 hr tablet 25 mg  25 mg Oral BID Wandra Mannan, MD   25 mg at 11/23/15 2316  . nitroGLYCERIN (NITROSTAT) SL tablet 0.4 mg  0.4 mg Sublingual Q5 min PRN Wandra Mannan, MD      . ondansetron (ZOFRAN) injection 4 mg  4 mg Intravenous Q6H PRN Wandra Mannan, MD      . potassium chloride SA (K-DUR,KLOR-CON) CR tablet 20 mEq  20 mEq Oral Daily Wandra Mannan, MD   20 mEq at 11/23/15 0936  . pravastatin  (PRAVACHOL) tablet 10 mg  10 mg Oral Daily Wandra Mannan, MD   10 mg at 11/23/15 0935  . sodium chloride 0.9 % injection 3 mL  3 mL Intravenous Q12H Wandra Mannan, MD   3 mL at 11/23/15 2200  . sodium chloride 0.9 % injection 3 mL  3 mL Intravenous PRN Wandra Mannan, MD      . spironolactone (ALDACTONE) tablet 12.5 mg  12.5 mg Oral Daily Wandra Mannan, MD   12.5 mg at 11/23/15 0935    Filed Vitals:   11/23/15 1624 11/23/15 2024 11/23/15 2315 11/24/15 0552  BP: 104/77 105/71 110/72 104/70  Pulse: 81 85 79 95  Temp: 98 F (36.7 C) 97.7 F (36.5 C)  97.8 F (36.6 C)  TempSrc: Oral Oral  Oral  Resp: 16 18    Height:      Weight:    139 lb 6.4 oz (63.231 kg)  SpO2: 98% 98%  97%    PHYSICAL EXAM General: NAD HEENT: Normal. Neck: supple Lungs: Mildly diminished  BS bases CV: RRR Abdomen: Soft, nontender, mild distention.  Neurologic: Grossly intact Extremities: No edema   LABS: Basic Metabolic Panel:  Recent Labs  11/23/15 0236 11/24/15 0320  NA  --  134*  K  --  3.3*  CL  --  99*  CO2  --  25  GLUCOSE  --  76  BUN  --  21*  CREATININE  --  1.27*  CALCIUM  --  9.3  MG 1.8  --     RADIOLOGY: Dg Chest 2 View  10/30/2015  CLINICAL DATA:  Chest pain and shortness of breath beginning yesterday. EXAM: CHEST  2 VIEW COMPARISON:  Chest radiograph September 26, 2015 FINDINGS: Cardiac silhouette is normal in size, status post median sternotomy for CABG. No pleural effusion or focal consolidation. No pneumothorax. Soft tissue planes included osseous structures are nonsuspicious. Mild degenerative change of thoracic spine. IMPRESSION: No acute cardiopulmonary process. Electronically Signed   By: Elon Alas M.D.   On: 10/30/2015 01:10      ASSESSMENT AND PLAN: 64 year old male with coronary artery disease status post artery bypass graft in August 2016, ischemic cardiomyopathy, hypertension, hyperlipidemia, angioedema with lisinopril admitted with acute on chronic systolic congestive  heart failure. Note chest CT at [facility] showed no pulmonary embolus but moderate bilateral pleural effusions.  . Acute on chronic systolic HF in the setting of ischemic cardiomyopathy and chronic systolic HF - I/O -[id number]; weight 139; continue lasix at present dose and follow renal function.  - Continue hydralazine/nitrates. No ACE inhibitor given recent angioedema. - Continue Toprol-XL - Repeat echo -- if EF remains <35% 90 days after revascularization (CABG) on optimal medical therapy then will consider referral to EP as outpt for ICD for primary prevention following DC  2. CAD, one stent in 1990's [place], now s/p 5V CABG 07/2015 here at [facility] - Minimal troponin elevation is likely due to acute HF and not ACS  - Electrocardiogram today shows sinus rhythm, left ventricular hypertrophy, nonspecific ST changes and prolonged QT interval.   3. History of CVA  - on ASA and Plavix  4. Aberrant right subclavian artery by CTA   5. History of angioedema with lisinopril recently  Will plan to discharge once congestive heart failure improves.  Kirk Ruths, M.D., F.A.C.C.

## 2015-11-24 NOTE — Progress Notes (Addendum)
Patient transported to cath lab.  Wife and son with patient and escorted to waiting room. 250 ml bolus with 10 ml left to to infuse.  Cath lab RN informed fluids ordered to infuse at 150 ml/hr after bolus infused.

## 2015-11-24 NOTE — Interval H&P Note (Signed)
Cath Lab Visit (complete for each Cath Lab visit)  Clinical Evaluation Leading to the Procedure:   ACS: No.  Non-ACS:    Anginal Classification: CCS IV  Anti-ischemic medical therapy: Maximal Therapy (2 or more classes of medications)  Non-Invasive Test Results: No non-invasive testing performed  Prior CABG: Previous CABG      History and Physical Interval Note:  11/24/2015 6:31 PM  Adam Shelton  has presented today for surgery, with the diagnosis of Unstable Angina  The various methods of treatment have been discussed with the patient and family. After consideration of risks, benefits and other options for treatment, the patient has consented to  Procedure(s): Left Heart Cath and Cors/Grafts Angiography (N/A) as a surgical intervention .  The patient's history has been reviewed, patient examined, no change in status, stable for surgery.  I have reviewed the patient's chart and labs.  Questions were answered to the patient's satisfaction.     KELLY,THOMAS A

## 2015-11-24 NOTE — Progress Notes (Signed)
  Echocardiogram 2D Echocardiogram has been performed.  Jennette Dubin 11/24/2015, 9:14 AM

## 2015-11-24 NOTE — Progress Notes (Signed)
CTSP for chest pressure, diaphoresis, and hypotension. BP this afternoon fell to 80/50 to right arm and 86/62 in the left arm. Rapid response arrived and began to run IV fluids - we advised to give 250cc bolus with last pressure 89/69. He continues to note vague chest pressure with radiation to left arm, rated 4-5/10. EKG shows sinus tach 110bpm with much more pronounced ST depression/TWI in I, avL, V3-V6 compared to this morning. Patient able to lay flat this evening, JVD flat, lungs clear, no edema, HR reg rhythm but elevated rate. He reports feeling "uneasy." Called Dr. Tamala Julian to bedside. Concerned for ongoing ischemia - Dr. Tamala Julian recommending we proceed with cath. Risks and benefits of cardiac catheterization have been discussed with the patient.  These include bleeding, infection, kidney damage, stroke, heart attack, death.  The patient understands these risks and is willing to proceed. See below for additional thoughts. Hold further diuretics and antihypertensives until we see stability of BP, except cut down metoprolol to 12.5mg  BID with hold parameters (BP coming up with fluid bolus -> 101/74). Will leave KCl on for now since he was hypokalemic this AM but if diuretics continue to be held this will need to be reconsidered. Rudra Hobbins PA-C

## 2015-11-24 NOTE — H&P (View-Only) (Signed)
CTSP for chest pressure, diaphoresis, and hypotension. BP this afternoon fell to 80/50 to right arm and 86/62 in the left arm. Rapid response arrived and began to run IV fluids - we advised to give 250cc bolus with last pressure 89/69. He continues to note vague chest pressure with radiation to left arm, rated 4-5/10. EKG shows sinus tach 110bpm with much more pronounced ST depression/TWI in I, avL, V3-V6 compared to this morning. Patient able to lay flat this evening, JVD flat, lungs clear, no edema, HR reg rhythm but elevated rate. He reports feeling "uneasy." Called Dr. Tamala Julian to bedside. Concerned for ongoing ischemia - Dr. Tamala Julian recommending we proceed with cath. Risks and benefits of cardiac catheterization have been discussed with the patient.  These include bleeding, infection, kidney damage, stroke, heart attack, death.  The patient understands these risks and is willing to proceed. See below for additional thoughts. Hold further diuretics and antihypertensives until we see stability of BP, except cut down metoprolol to 12.5mg  BID with hold parameters (BP coming up with fluid bolus -> 101/74). Will leave KCl on for now since he was hypokalemic this AM but if diuretics continue to be held this will need to be reconsidered. Verdella Laidlaw PA-C

## 2015-11-24 NOTE — Progress Notes (Signed)
ANTICOAGULATION CONSULT NOTE - Initial Consult  Pharmacy Consult for Heparin Indication: chest pain/ACS  Allergies  Allergen Reactions  . Lisinopril Other (See Comments)    Angioedema    Patient Measurements: Height: 5\' 4"  (162.6 cm) Weight: 139 lb 6.4 oz (63.231 kg) (scale c) IBW/kg (Calculated) : 59.2 Heparin Dosing Weight: 63.2 kg  Vital Signs: Temp: 97.9 F (36.6 C) (11/25 1626) Temp Source: Oral (11/25 1626) BP: 113/58 mmHg (11/25 2145) Pulse Rate: 84 (11/25 2200)  Labs:  Recent Labs  11/24/15 0320  CREATININE 1.27*    Estimated Creatinine Clearance: 50.5 mL/min (by C-G formula based on Cr of 1.27).   Medical History: Past Medical History  Diagnosis Date  . Hypertension   . Obesity   . Dyslipidemia   . Hypothyroidism   . Pulmonary valve disorders   . Mitral valve disorders   . Heart disease, unspecified   . Heart attack ([place]) 09/24/94  . Arthritis   . Stroke ([place])   . H/O: CVA (cerebrovascular accident), 06/22/15 Rt frontal rec'd TPA 08/14/2015   Assessment: 64 year old male status post PCI with intra-aortic balloon pump insertion and 3 stent placement on bivalirudin during procedure to switch to IV heparin. Delaying cath sheath removal until IABP assessment.   Goal of Therapy:  Heparin level 0.2 to 0.5 units/ml while IABP in place Monitor platelets by anticoagulation protocol: Yes   Plan:  Start IV heparin at 650 units/hr. No bolus.  Heparin level 6 hours after initiation.  Daily heparin level and CBC while on therapy.  Monitor for signs and symptoms of bleeding.   Sloan Leiter, PharmD, BCPS Clinical Pharmacist [phone] 11/24/2015,10:02 PM

## 2015-11-25 LAB — BASIC METABOLIC PANEL
Anion gap: 8 (ref 5–15)
BUN: 24 mg/dL — AB (ref 6–20)
CALCIUM: 9.2 mg/dL (ref 8.9–10.3)
CHLORIDE: 101 mmol/L (ref 101–111)
CO2: 25 mmol/L (ref 22–32)
CREATININE: 1.34 mg/dL — AB (ref 0.61–1.24)
GFR calc non Af Amer: 55 mL/min — ABNORMAL LOW (ref >60)
Glucose, Bld: 108 mg/dL — ABNORMAL HIGH (ref 65–99)
Potassium: 3.9 mmol/L (ref 3.5–5.1)
SODIUM: 134 mmol/L — AB (ref 135–145)

## 2015-11-25 LAB — CBC
HEMATOCRIT: 41.8 % (ref 39.0–52.0)
HEMOGLOBIN: 14.2 g/dL (ref 13.0–17.0)
MCH: 26.3 pg (ref 26.0–34.0)
MCHC: 34 g/dL (ref 30.0–36.0)
MCV: 77.6 fL — ABNORMAL LOW (ref 78.0–100.0)
Platelets: 262 10*3/uL (ref 150–400)
RBC: 5.39 MIL/uL (ref 4.22–5.81)
RDW: 14.7 % (ref 11.5–15.5)
WBC: 10.1 10*3/uL (ref 4.0–10.5)

## 2015-11-25 LAB — HEPARIN LEVEL (UNFRACTIONATED): HEPARIN UNFRACTIONATED: 0.49 [IU]/mL (ref 0.30–0.70)

## 2015-11-25 LAB — POCT ACTIVATED CLOTTING TIME: ACTIVATED CLOTTING TIME: 128 s

## 2015-11-25 LAB — MRSA PCR SCREENING: MRSA BY PCR: NEGATIVE

## 2015-11-25 MED ORDER — ATROPINE SULFATE 0.1 MG/ML IJ SOLN
INTRAMUSCULAR | Status: AC
  Filled 2015-11-25: qty 10

## 2015-11-25 MED ORDER — "THROMBI-PAD 3""X3"" EX PADS"
1.0000 | MEDICATED_PAD | Freq: Once | CUTANEOUS | Status: AC
  Administered 2015-11-25: 1 via TOPICAL
  Filled 2015-11-25: qty 1

## 2015-11-25 MED ORDER — SODIUM CHLORIDE 0.9 % IV SOLN: INTRAVENOUS | Status: DC

## 2015-11-25 NOTE — Progress Notes (Signed)
Left femoral sheath d/c'd. Pressure held for 20 minutes. Dsg. Applied, no complications, pt. Stable. BP 117/72 Heart rate 75. Instructions given to pt.

## 2015-11-25 NOTE — Progress Notes (Signed)
Arterial waveform on IABP observed dampened around 0055. Attempted flush but unable to flush. Pressure bag noticed to be deflated, it was fully inflated when Heparin bag was changed to NS. After consulting with charge RN on Joice and charge RN on 2S without any further options to troubleshoot Cardiology fellow paged and advised. Dr Rosanna Randy came to bedside and assessed. Paged and advised Dr Claiborne Billings.

## 2015-11-25 NOTE — Progress Notes (Signed)
7.5 Fr Sheath and Balloon pump removed from Rt Femoral Artery. Manual Pressure held for 30 minutes. Hemostasis achieved. VSS. Pre BP 120/87 P 75. Post BP 116/72 P 79. Patient states no complaints at this time. Rt groin soft with no active bleeding and hematoma noted. Post procedure bleeding precautions reviewed with patient. Tegaderm applied to Rt groin. Patient resting comfortably at this time. Groin site assessed with Susan,RN.

## 2015-11-25 NOTE — Progress Notes (Signed)
[facility] [place] for Heparin Indication: chest pain/ACS  Allergies  Allergen Reactions  . Lisinopril Other (See Comments)    Angioedema    Patient Measurements: Height: 5\' 4"  (162.6 cm) Weight: 139 lb 6.4 oz (63.231 kg) (scale c) IBW/kg (Calculated) : 59.2 Heparin Dosing Weight: 63.2 kg  Vital Signs: Temp: 98 F (36.7 C) (11/26 0400) Temp Source: Oral (11/25 2300) BP: 113/58 mmHg (11/25 2145) Pulse Rate: 75 (11/26 0400)  Labs:  Recent Labs  11/24/15 0320 11/25/15 0415  HGB  --  14.2  HCT  --  41.8  PLT  --  262  HEPARINUNFRC  --  0.49  CREATININE 1.27* 1.34*    Estimated Creatinine Clearance: 47.9 mL/min (by C-G formula based on Cr of 1.34).  Assessment: 64 year old male status with NSTEMI post PCI on IABP, for heparin  Goal of Therapy:  Heparin level 0.2 to 0.5 units/ml while IABP in place Monitor platelets by anticoagulation protocol: Yes   Plan:  Continue Heparin at current rate  F/U plans for IABP  Phillis Knack, PharmD, BCPS  11/25/2015,5:01 AM

## 2015-11-25 NOTE — Progress Notes (Signed)
Patient ID: JHAI LOVEGROVE, male   DOB: [birth year], 64 y.o.   MRN: [id number]    Patient Name: Adam Shelton Date of Encounter: 11/25/2015     Principal Problem:   Acute systolic heart failure [facility]) Active Problems:   Coronary artery disease   Acute systolic (congestive) heart failure (HCC)   ACS (acute coronary syndrome) (HCC)   Cardiogenic shock (HCC)    SUBJECTIVE  Denies chest pain or sob. "When can I eat"  CURRENT MEDS . aspirin EC  81 mg Oral Daily  . clopidogrel  75 mg Oral Q breakfast  . FLUoxetine  20 mg Oral Daily  . levothyroxine  50 mcg Oral QAC breakfast  . metoprolol succinate  12.5 mg Oral BID  . potassium chloride  20 mEq Oral Daily  . pravastatin  40 mg Oral q1800  . sodium chloride  3 mL Intravenous Q12H  . sodium chloride  3 mL Intravenous Q12H    OBJECTIVE  Filed Vitals:   11/25/15 0500 11/25/15 0600 11/25/15 0700 11/25/15 0800  BP: 87/40 83/38    Pulse: 70 68 68 64  Temp:    97.6 F (36.4 C)  TempSrc:    Oral  Resp: 19 17 16 12   Height:      Weight:      SpO2: 96% 96% 98% 100%    Intake/Output Summary (Last 24 hours) at 11/25/15 0833 Last data filed at 11/25/15 0800  Gross per 24 hour  Intake 1060.56 ml  Output   2695 ml  Net -1634.44 ml   Filed Weights   11/23/15 0402 11/24/15 0552 11/24/15 2200  Weight: 137 lb 11.2 oz (62.46 kg) 139 lb 6.4 oz (63.231 kg) 141 lb 5 oz (64.1 kg)    PHYSICAL EXAM  General: Pleasant, NAD. Neuro: Alert and oriented X 3. Moves all extremities spontaneously. Psych: Normal affect. HEENT:  Normal  Neck: Supple without bruits or JVD. Lungs:  Resp regular and unlabored, CTA. Heart: RRR no s3, s4, or murmurs. Abdomen: Soft, non-tender, non-distended, BS + x 4.  Extremities: No clubbing, cyanosis or edema. DP/PT/Radials 2+ and equal bilaterally. Minimal oozing over the right and left sheaths. Lower legs are warm  Accessory Clinical Findings  CBC  Recent Labs  11/25/15 0415  WBC 10.1  HGB 14.2    HCT 41.8  MCV 77.6*  PLT 99991111   Basic Metabolic Panel  Recent Labs  11/23/15 0236 11/24/15 0320 11/25/15 0415  NA  --  134* 134*  K  --  3.3* 3.9  CL  --  99* 101  CO2  --  25 25  GLUCOSE  --  76 108*  BUN  --  21* 24*  CREATININE  --  1.27* 1.34*  CALCIUM  --  9.3 9.2  MG 1.8  --   --    Liver Function Tests No results for input(s): AST, ALT, ALKPHOS, BILITOT, PROT, ALBUMIN in the last 72 hours. No results for input(s): LIPASE, AMYLASE in the last 72 hours. Cardiac Enzymes No results for input(s): CKTOTAL, CKMB, CKMBINDEX, TROPONINI in the last 72 hours. BNP Invalid input(s): POCBNP D-Dimer No results for input(s): DDIMER in the last 72 hours. Hemoglobin A1C No results for input(s): HGBA1C in the last 72 hours. Fasting Lipid Panel No results for input(s): CHOL, HDL, LDLCALC, TRIG, CHOLHDL, LDLDIRECT in the last 72 hours. Thyroid Function Tests No results for input(s): TSH, T4TOTAL, T3FREE, THYROIDAB in the last 72 hours.  Invalid input(s): FREET3  TELE  nsr  IABP - blood pressure cannot be measured. Questionable clot  Radiology/Studies  Dg Chest 2 View  10/30/2015  CLINICAL DATA:  Chest pain and shortness of breath beginning yesterday. EXAM: CHEST  2 VIEW COMPARISON:  Chest radiograph September 26, 2015 FINDINGS: Cardiac silhouette is normal in size, status post median sternotomy for CABG. No pleural effusion or focal consolidation. No pneumothorax. Soft tissue planes included osseous structures are nonsuspicious. Mild degenerative change of thoracic spine. IMPRESSION: No acute cardiopulmonary process. Electronically Signed   By: Elon Alas M.D.   On: 10/30/2015 01:10    ASSESSMENT AND PLAN  1. Unstable angina 2. S/p PCI/Stenting of distal left main 3. S/p IABP placed due to #2.  Rec: will stop heparin and pull IABP today. Advance diet as tolerated.   Kiel Cockerell,M.D.  11/25/2015 8:33 AM

## 2015-11-26 LAB — CBC
HEMATOCRIT: 35.9 % — AB (ref 39.0–52.0)
HEMOGLOBIN: 11.6 g/dL — AB (ref 13.0–17.0)
MCH: 25.4 pg — ABNORMAL LOW (ref 26.0–34.0)
MCHC: 32.3 g/dL (ref 30.0–36.0)
MCV: 78.7 fL (ref 78.0–100.0)
Platelets: 201 10*3/uL (ref 150–400)
RBC: 4.56 MIL/uL (ref 4.22–5.81)
RDW: 14.9 % (ref 11.5–15.5)
WBC: 8.8 10*3/uL (ref 4.0–10.5)

## 2015-11-26 LAB — BASIC METABOLIC PANEL
Anion gap: 6 (ref 5–15)
BUN: 22 mg/dL — ABNORMAL HIGH (ref 6–20)
CALCIUM: 8.7 mg/dL — AB (ref 8.9–10.3)
CHLORIDE: 105 mmol/L (ref 101–111)
CO2: 24 mmol/L (ref 22–32)
CREATININE: 1.09 mg/dL (ref 0.61–1.24)
GFR calc non Af Amer: >60 mL/min (ref >60)
GLUCOSE: 88 mg/dL (ref 65–99)
Potassium: 3.8 mmol/L (ref 3.5–5.1)
Sodium: 135 mmol/L (ref 135–145)

## 2015-11-26 MED ORDER — PRAVASTATIN SODIUM 20 MG PO TABS
20.0000 mg | ORAL_TABLET | Freq: Every day | ORAL | Status: DC
  Administered 2015-11-27 – 2015-11-28 (×2): 20 mg via ORAL
  Filled 2015-11-26 (×2): qty 1

## 2015-11-26 MED ORDER — LOPERAMIDE HCL 2 MG PO CAPS
2.0000 mg | ORAL_CAPSULE | ORAL | Status: DC | PRN
  Administered 2015-11-26: 2 mg via ORAL
  Filled 2015-11-26: qty 1

## 2015-11-26 MED ORDER — LOPERAMIDE HCL 2 MG PO CAPS
4.0000 mg | ORAL_CAPSULE | Freq: Once | ORAL | Status: AC
  Administered 2015-11-26: 4 mg via ORAL
  Filled 2015-11-26: qty 2

## 2015-11-26 NOTE — Progress Notes (Signed)
Utilization review completed.  

## 2015-11-26 NOTE — Progress Notes (Signed)
Patient ID: Adam Shelton, male   DOB: Jan 08, 1951, 64 y.o.   MRN: [id number]    SUBJECTIVE: The patient is doing well today.  At this time, he denies chest pain, shortness of breath, or any new concerns. He is anxious however.  Marland Kitchen aspirin EC  81 mg Oral Daily  . clopidogrel  75 mg Oral Q breakfast  . FLUoxetine  20 mg Oral Daily  . levothyroxine  50 mcg Oral QAC breakfast  . metoprolol succinate  12.5 mg Oral BID  . potassium chloride  20 mEq Oral Daily  . pravastatin  40 mg Oral q1800  . sodium chloride  3 mL Intravenous Q12H  . sodium chloride  3 mL Intravenous Q12H   . sodium chloride    . sodium chloride 50 mL/hr at 11/25/15 0800  . sodium chloride      OBJECTIVE: Physical Exam: Filed Vitals:   11/26/15 0500 11/26/15 0600 11/26/15 0700 11/26/15 0800  BP: 90/53 85/62 106/73   Pulse: 83 78 78   Temp:    97.8 F (36.6 C)  TempSrc:      Resp: 21 22 22    Height:      Weight: 140 lb 3.4 oz (63.6 kg)     SpO2: 91% 96% 92%     Intake/Output Summary (Last 24 hours) at 11/26/15 0834 Last data filed at 11/26/15 0700  Gross per 24 hour  Intake   1383 ml  Output    580 ml  Net    803 ml    Telemetry reveals sinus rhythm  GEN- The patient is well appearing, alert and oriented x 3 today.   Head- normocephalic, atraumatic Eyes-  Sclera clear, conjunctiva pink Ears- hearing intact Oropharynx- clear Neck- supple, no JVP Lymph- no cervical lymphadenopathy Lungs- Clear to ausculation bilaterally, normal work of breathing Heart- Regular rate and rhythm, no murmurs, rubs or gallops, PMI laterally displaced GI- soft, NT, ND, + BS Extremities- no clubbing, cyanosis, or edema. No hematoma Skin- no rash or lesion Psych- euthymic mood, full affect Neuro- strength and sensation are intact  LABS: Basic Metabolic Panel:  Recent Labs  11/25/15 0415 11/26/15 0216  NA 134* 135  K 3.9 3.8  CL 101 105  CO2 25 24  GLUCOSE 108* 88  BUN 24* 22*  CREATININE 1.34* 1.09  CALCIUM  9.2 8.7*   Liver Function Tests: No results for input(s): AST, ALT, ALKPHOS, BILITOT, PROT, ALBUMIN in the last 72 hours. No results for input(s): LIPASE, AMYLASE in the last 72 hours. CBC:  Recent Labs  11/25/15 0415 11/26/15 0216  WBC 10.1 8.8  HGB 14.2 11.6*  HCT 41.8 35.9*  MCV 77.6* 78.7  PLT 262 201   Cardiac Enzymes: No results for input(s): CKTOTAL, CKMB, CKMBINDEX, TROPONINI in the last 72 hours. BNP: Invalid input(s): POCBNP D-Dimer: No results for input(s): DDIMER in the last 72 hours. Hemoglobin A1C: No results for input(s): HGBA1C in the last 72 hours. Fasting Lipid Panel: No results for input(s): CHOL, HDL, LDLCALC, TRIG, CHOLHDL, LDLDIRECT in the last 72 hours. Thyroid Function Tests: No results for input(s): TSH, T4TOTAL, T3FREE, THYROIDAB in the last 72 hours.  Invalid input(s): FREET3 Anemia Panel: No results for input(s): VITAMINB12, FOLATE, FERRITIN, TIBC, IRON, RETICCTPCT in the last 72 hours.  RADIOLOGY: Dg Chest 2 View  10/30/2015  CLINICAL DATA:  Chest pain and shortness of breath beginning yesterday. EXAM: CHEST  2 VIEW COMPARISON:  Chest radiograph September 26, 2015 FINDINGS: Cardiac silhouette is normal  in size, status post median sternotomy for CABG. No pleural effusion or focal consolidation. No pneumothorax. Soft tissue planes included osseous structures are nonsuspicious. Mild degenerative change of thoracic spine. IMPRESSION: No acute cardiopulmonary process. Electronically Signed   By: Elon Alas M.D.   On: 10/30/2015 01:10    ASSESSMENT AND PLAN:  Principal Problem:   Acute systolic heart failure (HCC) Active Problems:   Coronary artery disease   Acute systolic (congestive) heart failure (HCC)   ACS (acute coronary syndrome) ([place])   Cardiogenic shock ([place])  He is s/p removal of his IABP and is doing well except for transient drops in blood pressure. He is anxious however. Wants me to make his blood pressure better. He is on  12.5 mg to metoprolol bid. Will hope to continue this. Will increase activity, keep in the unit today and plan to transfer out tomorrow.  Cristopher Peru, MD 11/26/2015 8:34 AM

## 2015-11-27 ENCOUNTER — Encounter (HOSPITAL_COMMUNITY): Payer: Self-pay | Admitting: Cardiovascular Disease

## 2015-11-27 ENCOUNTER — Encounter (HOSPITAL_COMMUNITY): Payer: BLUE CROSS/BLUE SHIELD

## 2015-11-27 DIAGNOSIS — I2510 Atherosclerotic heart disease of native coronary artery without angina pectoris: Secondary | ICD-10-CM

## 2015-11-27 LAB — CBC
HCT: 32.1 % — ABNORMAL LOW (ref 39.0–52.0)
Hemoglobin: 10.4 g/dL — ABNORMAL LOW (ref 13.0–17.0)
MCH: 25.4 pg — ABNORMAL LOW (ref 26.0–34.0)
MCHC: 32.4 g/dL (ref 30.0–36.0)
MCV: 78.5 fL (ref 78.0–100.0)
PLATELETS: 183 10*3/uL (ref 150–400)
RBC: 4.09 MIL/uL — AB (ref 4.22–5.81)
RDW: 14.9 % (ref 11.5–15.5)
WBC: 7 10*3/uL (ref 4.0–10.5)

## 2015-11-27 LAB — POCT ACTIVATED CLOTTING TIME: ACTIVATED CLOTTING TIME: 343 s

## 2015-11-27 MED ORDER — LEVOTHYROXINE SODIUM 50 MCG PO TABS
50.0000 ug | ORAL_TABLET | ORAL | Status: DC
  Administered 2015-11-28 – 2015-11-29 (×2): 50 ug via ORAL
  Filled 2015-11-27 (×2): qty 1

## 2015-11-27 MED ORDER — LEVOTHYROXINE SODIUM 50 MCG PO TABS: 25.0000 ug | ORAL_TABLET | Freq: Every day | ORAL | Status: DC

## 2015-11-27 MED ORDER — LEVOTHYROXINE SODIUM 75 MCG PO TABS: 75.0000 ug | ORAL_TABLET | ORAL | Status: DC

## 2015-11-27 MED ORDER — ALUM & MAG HYDROXIDE-SIMETH 200-200-20 MG/5ML PO SUSP
30.0000 mL | ORAL | Status: DC | PRN
  Administered 2015-11-27: 30 mL via ORAL
  Filled 2015-11-27: qty 30

## 2015-11-27 NOTE — Progress Notes (Signed)
Subjective:  No new complaints. Denies chest pain or dyspnea. Ambulating - reports his dyspnea on exertion has improved.  Objective:  Vital Signs in the last 24 hours: Temp:  [97.4 F (36.3 C)-98.4 F (36.9 C)] 98.2 F (36.8 C) (11/28 0400) Pulse Rate:  [77] 77 (11/27 0900) Resp:  [11-29] 21 (11/28 0700) BP: (77-118)/(55-92) 112/Adam mmHg (11/28 0700) SpO2:  [88 %-100 %] 96 % (11/28 0400) Weight:  [141 lb 14.4 oz (64.365 kg)] 141 lb 14.4 oz (64.365 kg) (11/28 0500)  Intake/Output from previous day: 11/27 0701 - 11/28 0700 In: 770 [P.O.:770] Out: 2050 [Urine:2050]  Physical Exam: Pt is alert and oriented, NAD HEENT: normal Neck: supple Lungs: CTA bilaterally CV: RRR without murmur or gallop Abd: soft, NT, Positive BS, no hepatomegaly Ext: no C/C/E, distal pulses intact and equal Skin: warm/dry no rash   Lab Results:  Recent Labs  11/26/15 0216 11/27/15 0237  WBC 8.8 7.0  HGB 11.6* 10.4*  PLT 201 183    Recent Labs  11/25/15 0415 11/26/15 0216  NA 134* 135  K 3.9 3.8  CL 101 105  CO2 25 24  GLUCOSE 108* 88  BUN 24* 22*  CREATININE 1.34* 1.09   No results for input(s): TROPONINI in the last 72 hours.  Invalid input(s): CK, MB  Cardiac Studies: Cath 11/25:  Ramus lesion, 100% stenosed.  Prox LAD lesion, 100% stenosed.  Prox Cx to Mid Cx lesion, 80% stenosed.  LIMA was injected is normal in caliber, and is anatomically normal.  Dist LAD lesion, 90% stenosed.  SVG was injected is normal in caliber.  Origin lesion, 100% stenosed.  was injected is normal in caliber, and is anatomically normal.  LM lesion, 90% stenosed. Post intervention, there is a 0% residual stenosis.  Ost Cx lesion, 99% stenosed. Post intervention, there is a 0% residual stenosis.  Dist Cx lesion, 85% stenosed. Post intervention, there is a 0% residual stenosis.  There is moderate to severe left ventricular systolic dysfunction.  Moderately severe LV dysfunction  with an ejection fraction of 35% with diffuse hypokinesis, more pronounced in the anterobasal and inferobasal segment with akinesis apically and evidence for 2-3+ mitral regurgitation.  Severe CAD with 90% distal left main stenosis, total occlusion of the proximal LAD, total occlusion of the proximal ramus intermediate vessel, and 99.9% stenosis ostially in the left circumflex coronary artery with diffuse stenoses of 80% in the proximal, mid and mid-distal vessel. Normal RCA.  Patent LIMA graft to the mid LAD with evidence for 90% stenosis in the LAD antegrade to the graft insertion.  Patent sequential vein graft supplying the ramus 1 and 2 vessels.  Occluded vein graft which had previously supplied sequentially the left circumflex OM 2 and OM3 vessels.   Distal aortography without significant aortoiliac disease.  Cardiogenic shock with insertion of an intra-aortic balloon pump prior to high risk percutaneous coronary intervention.  Difficult but successful percutaneous coronary intervention with PTCA/DES stenting of the 90% distal left main stenosis and left circumflex, 99.9% ostial stenosis with insertion of a 3.518 mm Resolute DES stent postdilated to 3.72 mm with the stenoses being reduced to 0% and additional tandem stenting with a 3.020 mm and 2.530 mm Resolute stents postdilated to 3.25 tapering to 2.9 mm with all sites being reduced to 0%.  Echo 11/25: LV EF 35-40%, severe hypokinesis of mid-apicalanteroseptal and apical myocardium, moderate mitral regurg, mildly dilated left atrium  Tele: NSR with occasional PVC  Assessment/Plan:  64 year old Shelton with  history of CAD s/p PCI in 1990s, 5V CABG (LIMA-LAD, SVG-RI1-RI2, SVG-OM2-OM3) 08/18/2015, ischemic cardiomyopathy, LV systolic dysfunction (LV EF 35-45% by LV angiography 08/14/2015), angioedema with lisinopril presented to [facility] with progressive dyspnea on exertion.   Acute on chronic systolic CHF, ischemic  cardiomyopathy: [facility]A chest at [facility] with no PE but bilateral moderate pleural effusions, aberrant right subclavian artery. Weight on admission 142. 141 this morning. 2.1L urine output over last 24 hours and -3.1L overall.  CAD: PCI in 1990s. 5V CABG 07/2015 at [facility]. Developed chest pressure and hypotension 11/25 with associated diffuse ST depression. Proceeded to cath 11/25 found to have 100% stenosed ramus, prox LAD, origin lesions, 90% stenosed dist LAD, 80% stenosed prox to mid Cx. Now s/p DES to LM, ost Cx and distal Cx. Maintained on intra-aortic balloon pump post procedure. Heparin gtt and balloon pump discontinued 11/26. -Indefinite dual antiplatelet therapy. Continue ASA 81mg  daily, Plavix 75mg  daily. -Continue metoprolol 12.5mg  BID. No ACE due to angioedema -Pravastatin 20mg  daily (muscle cramps with Lipitor 40mg  and 20mg )  History of CVA: Continue ASA 81mg  daily, Plavix 75mg  daily.  Hypothyroidism: Continue home Synthroid 78mcg daily Monday to Friday and 60mcg daily Saturday, Sunday.  Pre-diabetes: Hgb A1c 5.8% on 08/14/2015  Jacques Earthly, M.D. 11/27/2015, 8:35 AM

## 2015-11-27 NOTE — Progress Notes (Addendum)
Patient ID: Adam Shelton, male   DOB: Jan 13, 1951, 64 y.o.   MRN: [id number]      SUBJECTIVE:  The patient is doing well today.  At this time, he denies chest pain, shortness of breath, or any new concerns. He is anxious however about hypotension.  Marland Kitchen aspirin EC  81 mg Oral Daily  . clopidogrel  75 mg Oral Q breakfast  . FLUoxetine  20 mg Oral Daily  . levothyroxine  50 mcg Oral QAC breakfast  . metoprolol succinate  12.5 mg Oral BID  . potassium chloride  20 mEq Oral Daily  . pravastatin  20 mg Oral q1800   OBJECTIVE:  Physical Exam: Filed Vitals:   11/27/15 0400 11/27/15 0500 11/27/15 0700 11/27/15 0926  BP: 81/64 83/58 112/73 101/69  Pulse:    81  Temp: 98.2 F (36.8 C)     TempSrc: Oral     Resp: 20 23 21    Height:      Weight:  141 lb 14.4 oz (64.365 kg)    SpO2: 96%       Intake/Output Summary (Last 24 hours) at 11/27/15 0930 Last data filed at 11/27/15 0600  Gross per 24 hour  Intake    530 ml  Output   2050 ml  Net  -1520 ml   Telemetry reveals sinus rhythm  GEN- The patient is well appearing, alert and oriented x 3 today.   Head- normocephalic, atraumatic Eyes-  Sclera clear, conjunctiva pink Ears- hearing intact Oropharynx- clear Neck- supple, no JVP Lymph- no cervical lymphadenopathy Lungs- Clear to ausculation bilaterally, normal work of breathing Heart- Regular rate and rhythm, no murmurs, rubs or gallops, PMI laterally displaced GI- soft, NT, ND, + BS Extremities- no clubbing, cyanosis, or edema. No hematoma Skin- no rash or lesion Psych- euthymic mood, full affect Neuro- strength and sensation are intact  LABS: Basic Metabolic Panel:  Recent Labs  11/25/15 0415 11/26/15 0216  NA 134* 135  K 3.9 3.8  CL 101 105  CO2 25 24  GLUCOSE 108* 88  BUN 24* 22*  CREATININE 1.34* 1.09  CALCIUM 9.2 8.7*   CBC:  Recent Labs  11/26/15 0216 11/27/15 0237  WBC 8.8 7.0  HGB 11.6* 10.4*  HCT 35.9* 32.1*  MCV 78.7 78.5  PLT 201 183    RADIOLOGY:  Dg Chest 2 View  10/30/2015  CLINICAL DATA:  Chest pain and shortness of breath beginning yesterday. EXAM: CHEST  2 VIEW COMPARISON:  Chest radiograph September 26, 2015 FINDINGS: Cardiac silhouette is normal in size, status post median sternotomy for CABG. No pleural effusion or focal consolidation. No pneumothorax. Soft tissue planes included osseous structures are nonsuspicious. Mild degenerative change of thoracic spine. IMPRESSION: No acute cardiopulmonary process. Electronically Signed   By: Elon Alas M.D.   On: 10/30/2015 01:10   Cath 11/24/2015   Ramus lesion, 100% stenosed.  Prox LAD lesion, 100% stenosed.  Prox Cx to Mid Cx lesion, 80% stenosed.  LIMA was injected is normal in caliber, and is anatomically normal.  Dist LAD lesion, 90% stenosed.  SVG was injected is normal in caliber.  Origin lesion, 100% stenosed.  was injected is normal in caliber, and is anatomically normal.  LM lesion, 90% stenosed. Post intervention, there is a 0% residual stenosis.  Ost Cx lesion, 99% stenosed. Post intervention, there is a 0% residual stenosis.  Dist Cx lesion, 85% stenosed. Post intervention, there is a 0% residual stenosis.  There is moderate to severe left  ventricular systolic dysfunction.  Moderately severe LV dysfunction with an ejection fraction of 35% with diffuse hypokinesis, more pronounced in the anterobasal and inferobasal segment with akinesis apically and evidence for 2-3+ mitral regurgitation.  Cardiogenic shock with insertion of an intra-aortic balloon pump prior to high risk percutaneous coronary intervention.  Difficult but successful percutaneous coronary intervention with PTCA/DES stenting of the 90% distal left main stenosis and left circumflex, 99.9% ostial stenosis with insertion of a 3.518 mm Resolute DES stent postdilated to 3.72 mm with the stenoses being reduced to 0% and additional tandem stenting with a 3.020 mm and 2.530 mm  Resolute stents postdilated to 3.25 tapering to 2.9 mm with all sites being reduced to 0%.  RECOMMENDATION; The patient will be maintained on intra-aortic balloon pump counterpulsation throughout the evening with plans for probable weaning and discontinuing tomorrow. Heparin will be started. I suspect the mitral regurgitation may be due to significant papillary muscle dysfunction from the severe circumflex ischemia. Consider follow-up echo Doppler study. The patient should be maintained on dual antiplatelet therapy indefinitely.  ECHO: 11/24/2015 Study Conclusions  - Left ventricle: The cavity size was normal. Wall thickness was normal. Systolic function was moderately reduced. The estimated ejection fraction was in the range of 35% to 40%. There is severe hypokinesis of the mid-apicalanteroseptal and apical myocardium. Doppler parameters are consistent with restrictive physiology, indicative of decreased left ventricular diastolic compliance and/or increased left atrial pressure. - Mitral valve: There was moderate regurgitation. - Left atrium: The atrium was mildly dilated. - Right ventricle: Systolic function was mildly reduced.    ASSESSMENT AND PLAN:   Principal Problem:   Acute systolic heart failure (HCC) Active Problems:   Coronary artery disease   Acute systolic (congestive) heart failure (HCC)   ACS (acute coronary syndrome) ([place])   Cardiogenic shock ([place])  He is s/p removal of his IABP and is doing well except for transient drops in blood pressure, now normal. He is anxious however.  He is on 12.5 mg to metoprolol bid. Will hope to continue this. Unable to add ACEI or ARB yet, LVEF 35-40%. Will increase activity, transfer to telemetry today. Schedule PT & OT. Discharge in 1-2 days.  Dorothy Spark, MD 11/27/2015 9:30 AM

## 2015-11-27 NOTE — Progress Notes (Signed)
CARDIAC REHAB PHASE I   PRE:  Rate/Rhythm: 84 SR  BP:  Supine: 101/69  Sitting:   Standing:    SaO2:   MODE:  Ambulation: 700 ft   POST:  Rate/Rhythm: 105 ST  BP:  Supine:   Sitting: 133/88  Standing:    SaO2:  0922-1020 Pt walked 700 ft with hand held asst and no dizziness. Gait steady. To bathroom after walk and then assisted to recliner. No CP. Reviewed ex ed, CHF zones of when to call MD, importance of daily weights, low sodium,plavix. Pt very knowledgeable of meds and need for plavix. Wife stated he weighs every day and eats very little salt.  Will send update to [facility] Phase 2 as he was referred after CABG and had attended some classes. New referral sent.   Graylon Good, RN BSN  11/27/2015 10:16 AM

## 2015-11-28 DIAGNOSIS — F419 Anxiety disorder, unspecified: Secondary | ICD-10-CM | POA: Diagnosis present

## 2015-11-28 DIAGNOSIS — I5041 Acute combined systolic (congestive) and diastolic (congestive) heart failure: Secondary | ICD-10-CM

## 2015-11-28 DIAGNOSIS — Z9861 Coronary angioplasty status: Secondary | ICD-10-CM

## 2015-11-28 DIAGNOSIS — I519 Heart disease, unspecified: Secondary | ICD-10-CM

## 2015-11-28 DIAGNOSIS — T783XXA Angioneurotic edema, initial encounter: Secondary | ICD-10-CM

## 2015-11-28 DIAGNOSIS — T464X5A Adverse effect of angiotensin-converting-enzyme inhibitors, initial encounter: Secondary | ICD-10-CM | POA: Diagnosis present

## 2015-11-28 DIAGNOSIS — I2510 Atherosclerotic heart disease of native coronary artery without angina pectoris: Secondary | ICD-10-CM

## 2015-11-28 DIAGNOSIS — E785 Hyperlipidemia, unspecified: Secondary | ICD-10-CM

## 2015-11-28 DIAGNOSIS — I10 Essential (primary) hypertension: Secondary | ICD-10-CM

## 2015-11-28 LAB — BASIC METABOLIC PANEL
Anion gap: 8 (ref 5–15)
BUN: 13 mg/dL (ref 6–20)
CHLORIDE: 106 mmol/L (ref 101–111)
CO2: 23 mmol/L (ref 22–32)
CREATININE: 1.05 mg/dL (ref 0.61–1.24)
Calcium: 8.9 mg/dL (ref 8.9–10.3)
GFR calc Af Amer: >60 mL/min (ref >60)
GFR calc non Af Amer: >60 mL/min (ref >60)
GLUCOSE: 86 mg/dL (ref 65–99)
POTASSIUM: 4 mmol/L (ref 3.5–5.1)
SODIUM: 137 mmol/L (ref 135–145)

## 2015-11-28 LAB — CBC
HEMATOCRIT: 32.2 % — AB (ref 39.0–52.0)
HEMOGLOBIN: 10.2 g/dL — AB (ref 13.0–17.0)
MCH: 25 pg — AB (ref 26.0–34.0)
MCHC: 31.7 g/dL (ref 30.0–36.0)
MCV: 78.9 fL (ref 78.0–100.0)
Platelets: 210 10*3/uL (ref 150–400)
RBC: 4.08 MIL/uL — AB (ref 4.22–5.81)
RDW: 14.9 % (ref 11.5–15.5)
WBC: 7 10*3/uL (ref 4.0–10.5)

## 2015-11-28 MED ORDER — DM-GUAIFENESIN ER 30-600 MG PO TB12
1.0000 | ORAL_TABLET | Freq: Two times a day (BID) | ORAL | Status: DC
  Administered 2015-11-28 – 2015-11-29 (×3): 1 via ORAL
  Filled 2015-11-28 (×4): qty 1

## 2015-11-28 NOTE — Progress Notes (Signed)
Pt ambulated in the hall 500 ft. Tolerated well.

## 2015-11-28 NOTE — Progress Notes (Signed)
CARDIAC REHAB PHASE I   PRE:  Rate/Rhythm: 73 SR  BP:  Supine:   Sitting: 108/76  Standing:    SaO2:   MODE:  Ambulation: 1000 ft   POST:  Rate/Rhythm: 83 SR  BP:  Supine:   Sitting: 127/81  Standing:    SaO2:  1355-1423 Pt walked 1000 ft with steady gait. No CP or dizziness. Reviewed with pt questions he had regarding previous ed. Has walked independently several times today.   Graylon Good, RN BSN  11/28/2015 2:20 PM

## 2015-11-28 NOTE — Progress Notes (Signed)
Subjective:  No SOB or chest pain overnight  Objective:  Vital Signs in the last 24 hours: Temp:  [98 F (36.7 C)-98.2 F (36.8 C)] 98.1 F (36.7 C) (11/29 0605) Pulse Rate:  [77-81] 79 (11/29 0605) Resp:  [17-28] 18 (11/29 0605) BP: (91-109)/(61-79) 91/62 mmHg (11/29 0605) SpO2:  [96 %-100 %] 97 % (11/29 0605) Weight:  [142 lb 12.8 oz (64.774 kg)] 142 lb 12.8 oz (64.774 kg) (11/29 0605)  Intake/Output from previous day:  Intake/Output Summary (Last 24 hours) at 11/28/15 0821 Last data filed at 11/28/15 0607  Gross per 24 hour  Intake    720 ml  Output    650 ml  Net     70 ml    Physical Exam: General appearance: alert, cooperative and no distress Neck: no JVD Lungs: clear to auscultation bilaterally Heart: regular rate and rhythm Extremities: no edema Skin: Skin color, texture, turgor normal. No rashes or lesions Neurologic: Grossly normal   Rate: 78  Rhythm: normal sinus rhythm  Lab Results:  Recent Labs  11/27/15 0237 11/28/15 0409  WBC 7.0 7.0  HGB 10.4* 10.2*  PLT 183 210    Recent Labs  11/26/15 0216 11/28/15 0409  NA 135 137  K 3.8 4.0  CL 105 106  CO2 24 23  GLUCOSE 88 86  BUN 22* 13  CREATININE 1.09 1.05   No results for input(s): TROPONINI in the last 72 hours.  Invalid input(s): CK, MB No results for input(s): INR in the last 72 hours.  Scheduled Meds: . aspirin EC  81 mg Oral Daily  . clopidogrel  75 mg Oral Q breakfast  . FLUoxetine  20 mg Oral Daily  . levothyroxine  50 mcg Oral Once per day on Mon Tue Wed Thu Fri  . [START ON 12/02/2015] levothyroxine  75 mcg Oral Once per day on Sun Sat  . metoprolol succinate  12.5 mg Oral BID  . potassium chloride  20 mEq Oral Daily  . pravastatin  20 mg Oral q1800   Continuous Infusions:  PRN Meds:.acetaminophen, ALPRAZolam, alum & mag hydroxide-simeth, loperamide, nitroGLYCERIN, ondansetron (ZOFRAN) IV   Imaging: Imaging results have been reviewed  Cardiac Studies: Echo  11/24/15 Study Conclusions  - Left ventricle: The cavity size was normal. Wall thickness was normal. Systolic function was moderately reduced. The estimated ejection fraction was in the range of 35% to 40%. There is severe hypokinesis of the mid-apicalanteroseptal and apical myocardium. Doppler parameters are consistent with restrictive physiology, indicative of decreased left ventricular diastolic compliance and/or increased left atrial pressure. - Mitral valve: There was moderate regurgitation. - Left atrium: The atrium was mildly dilated. - Right ventricle: Systolic function was mildly reduced  Cath 11/24/15   Conclusion     Ramus lesion, 100% stenosed.  Prox LAD lesion, 100% stenosed.  Prox Cx to Mid Cx lesion, 80% stenosed.  LIMA was injected is normal in caliber, and is anatomically normal.  Dist LAD lesion, 90% stenosed.  SVG was injected is normal in caliber.  Origin lesion, 100% stenosed.  was injected is normal in caliber, and is anatomically normal.  LM lesion, 90% stenosed. Post intervention, there is a 0% residual stenosis.  Ost Cx lesion, 99% stenosed. Post intervention, there is a 0% residual stenosis.  Dist Cx lesion, 85% stenosed. Post intervention, there is a 0% residual stenosis.  There is moderate to severe left ventricular systolic dysfunction.  Moderately severe LV dysfunction with an ejection fraction of 35% with diffuse  hypokinesis, more pronounced in the anterobasal and inferobasal segment with akinesis apically and evidence for 2-3+ mitral regurgitation.  Severe CAD with 90% distal left main stenosis, total occlusion of the proximal LAD, total occlusion of the proximal ramus intermediate vessel, and 99.9% stenosis ostially in the left circumflex coronary artery with diffuse stenoses of 80% in the proximal, mid and mid-distal vessel. Normal RCA.  Patent LIMA graft to the mid LAD with evidence for 90% stenosis in the LAD antegrade  to the graft insertion.  Patent sequential vein graft supplying the ramus 1 and 2 vessels.  Occluded vein graft which had previously supplied sequentially the left circumflex OM 2 and OM3 vessels.   Distal aortography without significant aortoiliac disease.  Cardiogenic shock with insertion of an intra-aortic balloon pump prior to high risk percutaneous coronary intervention.  Difficult but successful percutaneous coronary intervention with PTCA/DES stenting of the 90% distal left main stenosis and left circumflex, 99.9% ostial stenosis with insertion of a 3.518 mm Resolute DES stent postdilated to 3.72 mm with the stenoses being reduced to 0% and additional tandem stenting with a 3.020 mm and 2.530 mm Resolute stents postdilated to 3.25 tapering to 2.9 mm with all sites being reduced to 0%.     Assessment/Plan:  64 yo Adam Shelton male with CAD, s/p PCIx1 in 1990's, followed by 5V CABG (LIMA-LAD, SVG-RI1-RI2, SVG-OM2-OM3) on 08/18/2015 by Dr. Roxan Hockey.  LVEF 35-45% by LV angiography and 45% by echo in 07/2015. Pt [facility]/23/15 with CHF and was transferred to [facility]. On 11/25 he had chest pain with EKG changes and shock and was taken to the cath lab. He required IABP for 48 hrs.   Principal Problem:   Acute combined systolic and diastolic heart failure (HCC) Active Problems:   ACS (acute coronary syndrome) (HCC)   Cardiogenic shock (HCC)   S/P urgent LM and SVG- OM PCI 11/24/15   LV dysfunction   H/O: CVA (cerebrovascular accident), 06/22/15 Rt frontal rec'd TPA   S/P CABG x 5- Aug 2016   ACE inhibitor-aggravated angioedema   Hypertension   Dyslipidemia   Hypothyroidism   Anxiety   PLAN: Ambulate today, possibly home tomorrow. No ACE or ARB secondary to angioedema. B/P probably will not tolerate Hydralazine/ Nitrate combination. Consider low dose diuretic at discharge.  Pt is anxious but reassured that his B/P is OK. F/U Dr Bronson Ing in [place] after discharge.     Adam Ransom PA-C 11/28/2015, 8:21 AM [phone]  The patient was seen, examined and discussed with Adam Ransom, PA-C and I agree with the above.   He is s/p removal of his IABP and is doing well except for transient drops in blood pressure, now normal. He is anxious however. He is on 12.5 mg to metoprolol bid. Will hope to continue this. Unable to add ACEI or ARB yet, LVEF 35-40%. Will increase activity, plan for a discharge tomorrow. Schedule PT & OT.   Dorothy Spark 11/28/2015

## 2015-11-29 ENCOUNTER — Ambulatory Visit: Payer: BLUE CROSS/BLUE SHIELD | Admitting: Cardiovascular Disease

## 2015-11-29 ENCOUNTER — Ambulatory Visit: Payer: PRIVATE HEALTH INSURANCE | Admitting: Cardiovascular Disease

## 2015-11-29 ENCOUNTER — Other Ambulatory Visit: Payer: BLUE CROSS/BLUE SHIELD

## 2015-11-29 ENCOUNTER — Encounter (HOSPITAL_COMMUNITY): Payer: BLUE CROSS/BLUE SHIELD

## 2015-11-29 DIAGNOSIS — F419 Anxiety disorder, unspecified: Secondary | ICD-10-CM

## 2015-11-29 LAB — BASIC METABOLIC PANEL
Anion gap: 7 (ref 5–15)
BUN: 16 mg/dL (ref 6–20)
CO2: 22 mmol/L (ref 22–32)
Calcium: 8.7 mg/dL — ABNORMAL LOW (ref 8.9–10.3)
Chloride: 104 mmol/L (ref 101–111)
Creatinine, Ser: 0.96 mg/dL (ref 0.61–1.24)
GFR calc Af Amer: >60 mL/min (ref >60)
GFR calc non Af Amer: >60 mL/min (ref >60)
Glucose, Bld: 87 mg/dL (ref 65–99)
Potassium: 3.9 mmol/L (ref 3.5–5.1)
Sodium: 133 mmol/L — ABNORMAL LOW (ref 135–145)

## 2015-11-29 LAB — CBC
HCT: 31.2 % — ABNORMAL LOW (ref 39.0–52.0)
HEMOGLOBIN: 10.1 g/dL — AB (ref 13.0–17.0)
MCH: 25.4 pg — AB (ref 26.0–34.0)
MCHC: 32.4 g/dL (ref 30.0–36.0)
MCV: 78.4 fL (ref 78.0–100.0)
PLATELETS: 214 10*3/uL (ref 150–400)
RBC: 3.98 MIL/uL — AB (ref 4.22–5.81)
RDW: 14.8 % (ref 11.5–15.5)
WBC: 7.7 10*3/uL (ref 4.0–10.5)

## 2015-11-29 MED ORDER — DM-GUAIFENESIN ER 30-600 MG PO TB12: 1.0000 | ORAL_TABLET | Freq: Two times a day (BID) | ORAL | Status: DC

## 2015-11-29 MED ORDER — CLOPIDOGREL BISULFATE 75 MG PO TABS: 75.0000 mg | ORAL_TABLET | Freq: Every day | ORAL | Status: DC

## 2015-11-29 MED ORDER — METOPROLOL SUCCINATE ER 25 MG PO TB24
12.5000 mg | ORAL_TABLET | Freq: Two times a day (BID) | ORAL | Status: DC
Start: 2015-11-29 — End: 2016-12-04

## 2015-11-29 MED ORDER — METOPROLOL SUCCINATE ER 25 MG PO TB24
12.5000 mg | ORAL_TABLET | Freq: Two times a day (BID) | ORAL | Status: DC
Start: 2015-11-29 — End: 2015-11-29

## 2015-11-29 MED ORDER — PRAVASTATIN SODIUM 20 MG PO TABS: 10.0000 mg | ORAL_TABLET | Freq: Every day | ORAL | Status: DC

## 2015-11-29 NOTE — Progress Notes (Signed)
Patient Name: Adam Shelton Date of Encounter: 11/29/2015  Principal Problem:   Acute combined systolic and diastolic heart failure ([place]) Active Problems:   Hypertension   Dyslipidemia   Hypothyroidism   LV dysfunction   H/O: CVA (cerebrovascular accident), 06/22/15 Rt frontal rec'd TPA   S/P CABG x 5- Aug 2016   ACS (acute coronary syndrome) ([place])   Cardiogenic shock ([place])   S/P urgent LM and SVG- OM PCI 11/24/15   ACE inhibitor-aggravated angioedema   Anxiety   Primary Cardiologist: Dr Paralee Cancel  Patient Profile: 64 yo male w/ hx CABG 07/2015, ICM w/ EF 45%, admitted 11/24 w/ CHF, on 11/25 chest pain/ECG changes and shock>>cath lab w/ DES Lmain/CFX & IABP x 48 hr.   SUBJECTIVE: Feels well, no chest pain or SOB w/ ambulation. SBP about 100 and he felt OK.  OBJECTIVE Filed Vitals:   11/28/15 1400 11/28/15 1703 11/28/15 2100 11/29/15 0423  BP: 108/76 113/83 116/77 100/62  Pulse:   83 85  Temp:   97.5 F (36.4 C) 98.2 F (36.8 C)  TempSrc:   Oral Oral  Resp:   20 17  Height:      Weight:    143 lb 3.2 oz (64.955 kg)  SpO2:   100% 98%    Intake/Output Summary (Last 24 hours) at 11/29/15 0902 Last data filed at 11/29/15 0612  Gross per 24 hour  Intake    480 ml  Output   1475 ml  Net   -995 ml   Filed Weights   11/27/15 0500 11/28/15 0605 11/29/15 0423  Weight: 141 lb 14.4 oz (64.365 kg) 142 lb 12.8 oz (64.774 kg) 143 lb 3.2 oz (64.955 kg)    PHYSICAL EXAM General: Well developed, well nourished, male in no acute distress. Head: Normocephalic, atraumatic.  Neck: Supple without bruits, JVD not elevated Lungs:  Resp regular and unlabored, CTA. Heart: RRR, S1, S2, no S3, S4, or murmur; no rub. Abdomen: Soft, non-tender, non-distended, BS + x 4.  Extremities: No clubbing, cyanosis, edema.  Neuro: Alert and oriented X 3. Moves all extremities spontaneously. Psych: Normal affect.  LABS: CBC: Recent Labs  11/28/15 0409 11/29/15 0245  WBC 7.0  7.7  HGB 10.2* 10.1*  HCT 32.2* 31.2*  MCV 78.9 78.4  PLT 210 [id number]   Basic Metabolic Panel: Recent Labs  11/28/15 0409 11/29/15 0245  NA 137 133*  K 4.0 3.9  CL 106 104  CO2 23 22  GLUCOSE 86 87  BUN 13 16  CREATININE 1.05 0.96  CALCIUM 8.9 8.7*   Lab Results  Component Value Date   TSH 3.386 08/14/2015    TELE:  SR, S brady, HR not sustained < 55     ECHO: 11/24/2015 - Left ventricle: The cavity size was normal. Wall thickness was normal. Systolic function was moderately reduced. The estimated ejection fraction was in the range of 35% to 40%. There is severe hypokinesis of the mid-apicalanteroseptal and apical myocardium. Doppler parameters are consistent with restrictive physiology, indicative of decreased left ventricular diastolic compliance and/or increased left atrial pressure. - Mitral valve: There was moderate regurgitation. - Left atrium: The atrium was mildly dilated. - Right ventricle: Systolic function was mildly reduced.  Current Medications:  . aspirin EC  81 mg Oral Daily  . clopidogrel  75 mg Oral Q breakfast  . dextromethorphan-guaiFENesin  1 tablet Oral BID  . FLUoxetine  20 mg Oral Daily  . levothyroxine  50 mcg Oral  Once per day on Mon Tue Wed Thu Fri  . [START ON 12/02/2015] levothyroxine  75 mcg Oral Once per day on Sun Sat  . metoprolol succinate  12.5 mg Oral BID  . potassium chloride  20 mEq Oral Daily  . pravastatin  20 mg Oral q1800      ASSESSMENT AND PLAN: Principal Problem:   Acute combined systolic and diastolic heart failure (HCC) - wt 143, feel this is dry weight (actually up a little from admit) - not currently on diuretic, MD advise on PRN Lasix  Active Problems:   Hypertension - good control    Dyslipidemia - continue statin pt can tolerate    Hypothyroidism - TSH OK, continue rx     LV dysfunction, ICM - Continue BB, no ARB 2nd borderline BP, no ACE 2nd allergy - PTA on Spironolactone 12.5 qd, MD  advise on continuing this.     H/O: CVA (cerebrovascular accident), 06/22/15 Rt frontal rec'd TPA - stable    S/P CABG x 5- Aug 2016 - occluded SVG-OM2-OM3 at cath - LIMA-LAD & SVG-RI1-RI2 patent    ACS (acute coronary syndrome) (HCC) - s/p stent Lmain/CFX    Cardiogenic shock ([place]) - improved    S/P urgent LM and SVG- OM PCI 11/24/15 - continue ASA, and low-dose BB    ACE inhibitor-aggravated angioedema - no ACE    Anxiety - stable  Plan: MD advise on d/c today. Has early f/u arrranged.  Signed, Rosaria Ferries , PA-C 9:02 AM 11/29/2015  The patient was seen, examined and discussed with Rosaria Ferries, PA-C and I agree with the above.   He is s/p removal of his IABP and is doing well except for transient drops in blood pressure, now normal. He is anxious however. He is on 12.5 mg to metoprolol bid. Will hope to continue this. Unable to add ACEI or ARB yet, LVEF 35-40%. Will increase activity, discharge today, he will follow with his PCP in [place], Dr Otelia Sergeant, we will arrange for a cardiology follow up and refer to cardiac rehabilitation.   Dorothy Spark 11/29/2015

## 2015-11-29 NOTE — Discharge Instructions (Signed)

## 2015-11-29 NOTE — Progress Notes (Signed)
Pt discharging home with wife. Given prescription medications. Discharge teaching given. No further questions VSS.

## 2015-11-29 NOTE — Discharge Summary (Signed)
CARDIOLOGY DISCHARGE SUMMARY   Patient ID: Adam Shelton MRN: [id number] DOB/AGE: 08/04/1951 64 y.o.  Admit date: 11/22/2015 Discharge date: 11/29/2015  PCP: Neale Burly, MD Primary Cardiologist: Dr Bronson Ing  Primary Discharge Diagnosis:    Acute combined systolic and diastolic heart failure ([place]) - weight at discharge 143 pounds  Secondary Discharge Diagnosis:    Hypertension   Dyslipidemia   Hypothyroidism   LV dysfunction   H/O: CVA (cerebrovascular accident), 06/22/15 Rt frontal rec'd TPA   S/P CABG x 5- Aug 2016   ACS (acute coronary syndrome) ([place])   Cardiogenic shock (HCC)   S/P urgent LM and SVG- OM PCI 11/24/15   ACE inhibitor-aggravated angioedema   Anxiety    Anemia  Procedures: 2-D echocardiogram, cardiac catheterization, coronary arteriogram, LIMA arteriogram, SVG angiogram, PTCA in DES to the left main/circumflex  Hospital Course: Adam Shelton is a 64 y.o. male with a history of CABG 07/2015, and ICM w/ EF 45%. He had increasing shortness of breath and went to [facility]. He was in heart failure and was transferred to [facility] for further evaluation and treatment.  Initial cardiac enzymes were negative for MI. He was given Lasix IV and his respiratory status initially improved.   He had some chest pressure, and on 11/25, the pressure became worse. His shortness of breath became worse as well. He became hypotensive and hypoxic. His ECG showed new diffuse ST depression. He was evaluated by Dr. Tamala Julian and taken emergently to the Cath Lab.  Cardiac catheterization results are below. He had a drug-eluting stent placed to the left main/circumflex. The SVG-OM2-OM3 was occluded. Because of ongoing problems with hypotension, an IABP was inserted. Catheterization results are below.  He did not require pressors and his pressure gradually improved. On 11/26, the IABP was removed.  His blood pressure was low but stable. He tolerated a low dose of a beta  blocker. However, because of blood pressure issues, hydralazine, nitrates and an ARB were not tried. No ACE inhibitor was considered due to history of angioedema. He was on Pravachol 20 mg daily and this was not changed because of a history of intolerance to Lipitor 20 and 40 mg daily.  He has a history of hypothyroidism and his TSH was normal. He was continued on his home dose of Synthroid. He reportedly has a history of prediabetes. Hemoglobin A1c was 5.8% in the past and not rechecked.  An echocardiogram was performed and showed an EF of 35-40 percent. Platelet inhibition had been checked in August and was therapeutic at 146. He is to be on aspirin and Plavix indefinitely.  He was noted to be mildly anemic but this is felt secondary to procedures and blood draws. Hemoglobin has been normal in the past and was normal on admission. This can be followed as an outpatient.  He was seen by cardiac rehabilitation and educated on exercise guidelines and post stent restrictions.  On 11/30, he was seen by Dr. Meda Coffee and all data were reviewed. No further inpatient workup was indicated and he is considered stable for discharge, to have an early outpatient follow-up appointment.  Labs:  Lab Results  Component Value Date   WBC 7.7 11/29/2015   HGB 10.1* 11/29/2015   HCT 31.2* 11/29/2015   MCV 78.4 11/29/2015   PLT 214 11/29/2015     Recent Labs Lab 11/29/15 0245  NA 133*  K 3.9  CL 104  CO2 22  BUN 16  CREATININE 0.96  CALCIUM 8.7*  GLUCOSE 87   Lipid Panel     Component Value Date/Time   CHOL 133 08/15/2015 0441   TRIG 80 08/15/2015 0441   HDL 32* 08/15/2015 0441   CHOLHDL 4.2 08/15/2015 0441   VLDL 16 08/15/2015 0441   LDLCALC 85 08/15/2015 0441    B NATRIURETIC PEPTIDE  Date/Time Value Ref Range Status  10/29/2015 11:22 PM 66.9 0.0 - 100.0 pg/mL Final  09/02/2015 10:17 PM 85.5 0.0 - 100.0 pg/mL Final     Cardiac Cath: 11/24/2015 Conclusion     Ramus lesion, 100%  stenosed.  Prox LAD lesion, 100% stenosed.  Prox Cx to Mid Cx lesion, 80% stenosed.  LIMA was injected is normal in caliber, and is anatomically normal.  Dist LAD lesion, 90% stenosed.  SVG was injected is normal in caliber.  Origin lesion, 100% stenosed.  was injected is normal in caliber, and is anatomically normal.  LM lesion, 90% stenosed. Post intervention, there is a 0% residual stenosis.  Ost Cx lesion, 99% stenosed. Post intervention, there is a 0% residual stenosis.  Dist Cx lesion, 85% stenosed. Post intervention, there is a 0% residual stenosis.  There is moderate to severe left ventricular systolic dysfunction.  Moderately severe LV dysfunction with an ejection fraction of 35% with diffuse hypokinesis, more pronounced in the anterobasal and inferobasal segment with akinesis apically and evidence for 2-3+ mitral regurgitation.  Severe CAD with 90% distal left main stenosis, total occlusion of the proximal LAD, total occlusion of the proximal ramus intermediate vessel, and 99.9% stenosis ostially in the left circumflex coronary artery with diffuse stenoses of 80% in the proximal, mid and mid-distal vessel. Normal RCA.  Patent LIMA graft to the mid LAD with evidence for 90% stenosis in the LAD antegrade to the graft insertion.  Patent sequential vein graft supplying the ramus 1 and 2 vessels.  Occluded vein graft which had previously supplied sequentially the left CFX-OM2-OM3 vessels.   Distal aortography without significant aortoiliac disease.  Cardiogenic shock with insertion of an intra-aortic balloon pump prior to high risk percutaneous coronary intervention.  Difficult but successful percutaneous coronary intervention with PTCA/DES stenting of the 90% distal left main stenosis and left circumflex, 99.9% ostial stenosis with insertion of a 3.518 mm Resolute DES stent postdilated to 3.72 mm with the stenoses being reduced to 0% and additional tandem stenting  with a 3.020 mm and 2.530 mm Resolute stents postdilated to 3.25 tapering to 2.9 mm with all sites being reduced to 0%.       EKG: Sinus rhythm/sinus bradycardia  ECHO: 11/24/2015 - Left ventricle: The cavity size was normal. Wall thickness was normal. Systolic function was moderately reduced. The estimated ejection fraction was in the range of 35% to 40%. There is severe hypokinesis of the mid-apicalanteroseptal and apical myocardium. Doppler parameters are consistent with restrictive physiology, indicative of decreased left ventricular diastolic compliance and/or increased left atrial pressure. - Mitral valve: There was moderate regurgitation. - Left atrium: The atrium was mildly dilated. - Right ventricle: Systolic function was mildly reduced.  FOLLOW UP PLANS AND APPOINTMENTS Allergies  Allergen Reactions  . Lipitor [Atorvastatin] Other (See Comments)    Myalgias  . Lisinopril Other (See Comments)    Angioedema     Medication List    STOP taking these medications        spironolactone 25 MG tablet  Commonly known as:  ALDACTONE      TAKE these medications        acetaminophen  500 MG tablet  Commonly known as:  TYLENOL  Take 1,000 mg by mouth every 6 (six) hours as needed.     ALPRAZolam 0.5 MG tablet  Commonly known as:  XANAX  Take 1 tablet by mouth at bedtime.     aspirin 81 MG EC tablet  Take 81 mg by mouth daily.     clopidogrel 75 MG tablet  Commonly known as:  PLAVIX  Take 1 tablet (75 mg total) by mouth daily.     dextromethorphan-guaiFENesin 30-600 MG 12hr tablet  Commonly known as:  MUCINEX DM  Take 1 tablet by mouth 2 (two) times daily.     FLUoxetine 20 MG tablet  Commonly known as:  PROZAC  Take 1 tablet by mouth daily.     furosemide 20 MG tablet  Commonly known as:  LASIX  Take One Tablet  2 Times Weekly     isosorbide mononitrate 30 MG 24 hr tablet  Commonly known as:  IMDUR  Take 30 mg by mouth daily. Added by dr  Sherrie Sport     levothyroxine 75 MCG tablet  Commonly known as:  SYNTHROID, LEVOTHROID  Take 75 mcg by mouth daily before breakfast. Take 1 tablet only on Sat & Sun     levothyroxine 50 MCG tablet  Commonly known as:  SYNTHROID, LEVOTHROID  Take 50 mcg by mouth daily before breakfast. On M-F     metoprolol succinate 25 MG 24 hr tablet  Commonly known as:  TOPROL XL  Take 0.5 tablets (12.5 mg total) by mouth 2 (two) times daily.     nitroGLYCERIN 0.4 MG SL tablet  Commonly known as:  NITROSTAT  Place 0.4 mg under the tongue every 5 (five) minutes as needed for chest pain.     pravastatin 20 MG tablet  Commonly known as:  PRAVACHOL  Take 0.5 tablets (10 mg total) by mouth daily.     PRESERVISION AREDS 2 PO  Take 1 tablet by mouth every other day.     PX FOLIC ACID A999333 MCG tablet  Generic drug:  folic acid  Take [id number] mcg by mouth daily.     vitamin B-12 500 MCG tablet  Commonly known as:  CYANOCOBALAMIN  Take 500 mcg by mouth daily.     vitamin E 200 UNIT capsule  Take 200 Units by mouth daily.        Discharge Instructions    (HEART FAILURE PATIENTS) Call MD:  Anytime you have any of the following symptoms: 1) 3 pound weight gain in 24 hours or 5 pounds in 1 week 2) shortness of breath, with or without a dry hacking cough 3) swelling in the hands, feet or stomach 4) if you have to sleep on extra pillows at night in order to breathe.    Complete by:  As directed      Amb Referral to Cardiac Rehabilitation    Complete by:  As directed   Diagnosis:  PCI Comment - sending update. has been in program     Diet - low sodium heart healthy    Complete by:  As directed      Increase activity slowly    Complete by:  As directed           Follow-up Information    Follow up with Jory Sims, NP On 12/05/2015.   Specialties:  Nurse Practitioner, Radiology, Cardiology   Why:  See provider at 1:30 pm, please arrive 15 minutes early for paperwork.   Contact  information:   [place] [address] [phone]       BRING ALL MEDICATIONS WITH YOU TO FOLLOW UP APPOINTMENTS  Time spent with patient to include physician time: 48 min Signed: Rosaria Ferries, PA-C 11/29/2015, 12:51 PM Co-Sign MD

## 2015-11-30 ENCOUNTER — Telehealth: Payer: Self-pay | Admitting: Cardiovascular Disease

## 2015-11-30 NOTE — Telephone Encounter (Signed)
D/ C phone call .Marland Kitchen Appt is on 12/15/15  At 1:30pm w/ Jory Sims at the [facility] office   Thanks

## 2015-11-30 NOTE — Telephone Encounter (Signed)
Spoke to patient. He does not want to see another provider  - he is asking to see Dr. Bronson Ing next available. Can we arrange him to see for TOC f/u?

## 2015-12-01 ENCOUNTER — Encounter (HOSPITAL_COMMUNITY): Payer: BLUE CROSS/BLUE SHIELD

## 2015-12-04 ENCOUNTER — Encounter (HOSPITAL_COMMUNITY): Payer: BLUE CROSS/BLUE SHIELD

## 2015-12-05 ENCOUNTER — Encounter: Payer: Self-pay | Admitting: Cardiovascular Disease

## 2015-12-05 ENCOUNTER — Ambulatory Visit (INDEPENDENT_AMBULATORY_CARE_PROVIDER_SITE_OTHER): Payer: BLUE CROSS/BLUE SHIELD | Admitting: Cardiovascular Disease

## 2015-12-05 ENCOUNTER — Ambulatory Visit: Payer: BLUE CROSS/BLUE SHIELD | Admitting: Adult Health

## 2015-12-05 VITALS — BP 122/78 | HR 80 | Ht 64.0 in | Wt 144.0 lb

## 2015-12-05 DIAGNOSIS — I639 Cerebral infarction, unspecified: Secondary | ICD-10-CM

## 2015-12-05 DIAGNOSIS — T82599S Other mechanical complication of unspecified cardiac and vascular devices and implants, sequela: Secondary | ICD-10-CM | POA: Diagnosis not present

## 2015-12-05 DIAGNOSIS — I10 Essential (primary) hypertension: Secondary | ICD-10-CM

## 2015-12-05 DIAGNOSIS — R0602 Shortness of breath: Secondary | ICD-10-CM

## 2015-12-05 DIAGNOSIS — I25708 Atherosclerosis of coronary artery bypass graft(s), unspecified, with other forms of angina pectoris: Secondary | ICD-10-CM | POA: Diagnosis not present

## 2015-12-05 DIAGNOSIS — Z87898 Personal history of other specified conditions: Secondary | ICD-10-CM

## 2015-12-05 DIAGNOSIS — I5022 Chronic systolic (congestive) heart failure: Secondary | ICD-10-CM

## 2015-12-05 DIAGNOSIS — Z9289 Personal history of other medical treatment: Secondary | ICD-10-CM

## 2015-12-05 DIAGNOSIS — Z951 Presence of aortocoronary bypass graft: Secondary | ICD-10-CM | POA: Diagnosis not present

## 2015-12-05 DIAGNOSIS — E785 Hyperlipidemia, unspecified: Secondary | ICD-10-CM

## 2015-12-05 MED ORDER — ROSUVASTATIN CALCIUM 10 MG PO TABS
10.0000 mg | ORAL_TABLET | Freq: Every day | ORAL | Status: DC
Start: 2015-12-05 — End: 2016-02-27

## 2015-12-05 NOTE — Progress Notes (Signed)
Patient ID: Adam Shelton, male   DOB: Jul 27, 1951, 64 y.o.   MRN: [id number]      SUBJECTIVE: The patient presents for a transition of care appt after undergoing PCI of a distal 90% left main lesion and 99% ostial circumflex lesion. The SVG to the LCx and OM2 and OM3 was found to be occluded. He was in cardiogenic shock and temporarily required IABP support. He was also hospitalized for acute on chronic systolic and diastolic heart failure. I evaluated him while he was in the hospital on Thanksgiving before he underwent coronary angiography.  Echocardiogram on 11/24/15 showed moderately reduced LV systolic function, EF [id number] with restrictive physiology and moderate mitral regurgitation.  He was discharged on ASA, Plavix, Lasix, Imdur 30 mg, Toprol-XL 12.5 mg bid, and pravastatin. He developed angioedema with ACEI's and has not tolerated Lipitor in the past.  In summary, he underwent 5 vessel coronary artery bypass graft surgery earlier this year. Specifically , he had a left internal mammary artery to left anterior descending, sequential saphenous vein graft to ramus intermedius 1 and 2, saphenous vein graft to obtuse marginals 2 and 3 by Dr. Roxan Hockey on 08/18/2015. He initially sustained an MI in 1995 treated with streptokinase in [place]. Past medical history is also significant for hypertension, hyperlipidemia , obesity, hypothyroidism , and a CVA on 06/22/15 while flying home from [place]. He sustained a non-STEMI prior to undergoing bypass surgery.  He is very anxious and takes Xanax at night to help him sleep as well as Prozac.   He and his wife have a multitude of questions regarding each medication, his bypass graft failure, and dietary restrictions.  They also called Dr. Ilda Mori from [facility] to have me speak with him regarding options such as Brilinta and Crestor.  He denies chest pain and says his breathing has significantly improved. He plans to attend cardiac rehab.  He  also has questions regarding Viagra and sexual activity.    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 3 sons, one studying biology in [place], one son doing PhD in Estate manager/land agent, another son doing PhD in Engineer, drilling.      Review of Systems: As per "subjective", otherwise negative.  Allergies  Allergen Reactions  . Lipitor [Atorvastatin] Other (See Comments)    Myalgias  . Lisinopril Other (See Comments)    Angioedema    Current Outpatient Prescriptions  Medication Sig Dispense Refill  . acetaminophen (TYLENOL) 500 MG tablet Take 1,000 mg by mouth every 6 (six) hours as needed.    . ALPRAZolam (XANAX) 0.5 MG tablet Take 1 tablet by mouth at bedtime.  0  . aspirin 81 MG EC tablet Take 81 mg by mouth daily.      . clopidogrel (PLAVIX) 75 MG tablet Take 1 tablet (75 mg total) by mouth daily. 90 tablet 6  . dextromethorphan-guaiFENesin (MUCINEX DM) 30-600 MG 12hr tablet Take 1 tablet by mouth 2 (two) times daily.    Marland Kitchen FLUoxetine (PROZAC) 20 MG tablet Take 1 tablet by mouth daily.  0  . folic acid (PX FOLIC ACID) A999333 MCG tablet Take 400 mcg by mouth daily.      . furosemide (LASIX) 20 MG tablet Take One Tablet  2 Times Weekly 24 tablet 3  . levothyroxine (SYNTHROID, LEVOTHROID) 50 MCG tablet Take 50 mcg by mouth daily before breakfast. On M-F    . levothyroxine (SYNTHROID, LEVOTHROID) 75 MCG tablet Take 75 mcg by mouth daily before breakfast. Take  1 tablet only on Sat & Sun    . metoprolol succinate (TOPROL XL) 25 MG 24 hr tablet Take 0.5 tablets (12.5 mg total) by mouth 2 (two) times daily. 180 tablet 6  . Multiple Vitamins-Minerals (PRESERVISION AREDS 2 PO) Take 1 tablet by mouth every other day.    . nitroGLYCERIN (NITROSTAT) 0.4 MG SL tablet Place 0.4 mg under the tongue every 5 (five) minutes as needed for chest pain.    . vitamin B-12 (CYANOCOBALAMIN) 500 MCG tablet Take 500 mcg by mouth daily.    . vitamin E 200 UNIT  capsule Take 200 Units by mouth daily.    . rosuvastatin (CRESTOR) 10 MG tablet Take 1 tablet (10 mg total) by mouth daily. 90 tablet 3   No current facility-administered medications for this visit.    Past Medical History  Diagnosis Date  . Hypertension   . Obesity   . Dyslipidemia   . Hypothyroidism   . Pulmonary valve disorders   . Mitral valve disorders   . Heart disease, unspecified   . Heart attack ([facility]) 09/24/94  . Arthritis   . Stroke ([facility])   . H/O: CVA (cerebrovascular accident), 06/22/15 Rt frontal rec'd TPA 08/14/2015    Past Surgical History  Procedure Laterality Date  . Hernia repair      age 55  . Cardiac catheterization  12/10/94  . Cardiac catheterization N/A 08/14/2015    Procedure: Left Heart Cath and Coronary Angiography;  Surgeon: Sherren Mocha, MD;  Location: [facility];  Service: Cardiovascular;  Laterality: N/A;  . Coronary artery bypass graft N/A 08/18/2015    Procedure: CORONARY ARTERY BYPASS GRAFTING (CABG)  X 5 UTILIZING THE LEFT INTERNAL MAMMARY ARTERY AND ENDOSCOPICALLY HARVESTED RIGHT AND LEFT SAPHENEOUS VEINS.;  Surgeon: Melrose Nakayama, MD;  Location: [facility];  Service: Open Heart Surgery;  Laterality: N/A;  . Tee without cardioversion N/A 08/18/2015    Procedure: TRANSESOPHAGEAL ECHOCARDIOGRAM (TEE);  Surgeon: Melrose Nakayama, MD;  Location: [facility];  Service: Open Heart Surgery;  Laterality: N/A;  . Cardiac catheterization N/A 11/24/2015    Procedure: Left Heart Cath and Cors/Grafts Angiography;  Surgeon: Troy Sine, MD;  Location: [facility];  Service: Cardiovascular;  Laterality: N/A;  . Cardiac catheterization  11/24/2015    Procedure: Coronary Stent Intervention;  Surgeon: Troy Sine, MD;  Location: [facility];  Service: Cardiovascular;;    Social History   Social History  . Marital Status: Married    Spouse Name: N/A  . Number of Children: N/A  . Years of Education: N/A   Occupational History  .  Not on file.   Social History Main Topics  . Smoking status: Never Smoker   . Smokeless tobacco: Never Used  . Alcohol Use: No  . Drug Use: No  . Sexual Activity: Not on file   Other Topics Concern  . Not on file   Social History Narrative   Retired   Has 3 children   Married to [employer]   Regular exercise--yes 5 days/week 53min     Filed Vitals:   12/05/15 1312  BP: 122/78  Pulse: 80  Height: 5\' 4"  (1.626 m)  Weight: 144 lb (65.318 kg)  SpO2: 99%    PHYSICAL EXAM General: NAD HEENT: Normal. Neck: No JVD, no thyromegaly. Lungs: Clear to auscultation bilaterally with normal respiratory effort. CV: Nondisplaced PMI. Regular rate and rhythm, normal S1/S2, no S3/S4, no murmur. No pretibial or periankle edema.  No carotid bruit. Well healed midline sternotomy incision.  Abdomen: Soft, nontender, no distention.  Neurologic: Alert and oriented x 3.  Psych: Normal affect. Skin: Normal. Musculoskeletal: Normal range of motion, no gross deformities. Extremities: No clubbing or cyanosis.   ECG: Most recent ECG reviewed.      ASSESSMENT AND PLAN: 1. CAD with s/p 5-vessel CABG with recent PCI: Symptomatically stable. Continue ASA, Toprol-XL, and Plavix. P2Y12 146 on 08/14/15, indicating adequate platelet inhibition. As he would benefit from more potent statin therapy, will d/c pravastatin 20 mg and start Crestor 10 mg. I will follow up with him via phone call in roughly 2 weeks and if he tolerates Crestor 10 mg, will plan to increase to 20 mg. Will likely continue dual antiplatelet therapy indefinitely.  2. Essential HTN: Stable. No changes.  3. Dyslipidemia: Lipids previously reviewed. As he would benefit from more potent statin therapy, will d/c pravastatin 20 mg and start Crestor 10 mg. I will follow up with him via phone call in roughly 2 weeks and if he tolerates Crestor 10 mg, will plan to increase to 20 mg. Lipitor caused myalgias.  4. CVA: BP controlled.  Continue Plavix.  5. Chronic systolic heart failure, EF 35-40%: Euvolemic on Lasix 20 mg on alternate days. Will continue Toprol-XL at present dose. He has not been taking Imdur 30 mg for nearly 3 weeks so will d/c for now.  Dispo: f/u 3 months.  Time spent: 45 minutes  Kate Sable, M.D., F.A.C.C.

## 2015-12-05 NOTE — Patient Instructions (Signed)
Your physician recommends that you schedule a follow-up appointment in: 2 months with Dr Bronson Ing at [place] office   STOP IMDUR  STOP Pravastatin   START Crestor 10 mg daily at dinner  I will call you in 2 weeks and see how you are tolerating Crestor     If you need a refill on your cardiac medications before your next appointment, please call your pharmacy.        Thank you for choosing [facility] !

## 2015-12-06 ENCOUNTER — Encounter (HOSPITAL_COMMUNITY): Payer: BLUE CROSS/BLUE SHIELD

## 2015-12-07 NOTE — Telephone Encounter (Signed)
Patient saw Dr. Bronson Ing on Monday, 12/05/2015.  This encounter can be closed.

## 2015-12-08 ENCOUNTER — Encounter (HOSPITAL_COMMUNITY): Payer: BLUE CROSS/BLUE SHIELD

## 2015-12-11 ENCOUNTER — Encounter (HOSPITAL_COMMUNITY)
Admission: RE | Admit: 2015-12-11 | Discharge: 2015-12-11 | Disposition: A | Discharge status: Home / Self Care | Payer: BLUE CROSS/BLUE SHIELD | Source: Ambulatory Visit | Attending: Cardiovascular Disease | Admitting: Cardiovascular Disease

## 2015-12-11 DIAGNOSIS — Z955 Presence of coronary angioplasty implant and graft: Secondary | ICD-10-CM | POA: Insufficient documentation

## 2015-12-11 DIAGNOSIS — I2510 Atherosclerotic heart disease of native coronary artery without angina pectoris: Secondary | ICD-10-CM | POA: Insufficient documentation

## 2015-12-13 ENCOUNTER — Encounter (HOSPITAL_COMMUNITY): Payer: BLUE CROSS/BLUE SHIELD

## 2015-12-15 ENCOUNTER — Encounter (HOSPITAL_COMMUNITY)
Admission: RE | Admit: 2015-12-15 | Discharge: 2015-12-15 | Disposition: A | Discharge status: Home / Self Care | Payer: BLUE CROSS/BLUE SHIELD | Source: Ambulatory Visit | Attending: Cardiovascular Disease | Admitting: Cardiovascular Disease

## 2015-12-15 DIAGNOSIS — Z955 Presence of coronary angioplasty implant and graft: Secondary | ICD-10-CM | POA: Diagnosis not present

## 2015-12-15 NOTE — Progress Notes (Signed)
Patient was given individual home exercise plan. Handout was reviewed and discussed. Patient verbalized an understanding. 

## 2015-12-18 ENCOUNTER — Encounter (HOSPITAL_COMMUNITY)
Admission: RE | Admit: 2015-12-18 | Discharge: 2015-12-18 | Disposition: A | Discharge status: Home / Self Care | Payer: BLUE CROSS/BLUE SHIELD | Source: Ambulatory Visit | Attending: Cardiovascular Disease | Admitting: Cardiovascular Disease

## 2015-12-18 DIAGNOSIS — Z955 Presence of coronary angioplasty implant and graft: Secondary | ICD-10-CM | POA: Diagnosis not present

## 2015-12-20 ENCOUNTER — Encounter (HOSPITAL_COMMUNITY)
Admission: RE | Admit: 2015-12-20 | Discharge: 2015-12-20 | Disposition: A | Discharge status: Home / Self Care | Payer: BLUE CROSS/BLUE SHIELD | Source: Ambulatory Visit | Attending: Cardiovascular Disease | Admitting: Cardiovascular Disease

## 2015-12-20 DIAGNOSIS — Z955 Presence of coronary angioplasty implant and graft: Secondary | ICD-10-CM | POA: Diagnosis not present

## 2015-12-22 ENCOUNTER — Encounter (HOSPITAL_COMMUNITY)
Admission: RE | Admit: 2015-12-22 | Discharge: 2015-12-22 | Disposition: A | Discharge status: Home / Self Care | Payer: BLUE CROSS/BLUE SHIELD | Source: Ambulatory Visit | Attending: Cardiovascular Disease | Admitting: Cardiovascular Disease

## 2015-12-22 DIAGNOSIS — Z955 Presence of coronary angioplasty implant and graft: Secondary | ICD-10-CM | POA: Diagnosis not present

## 2015-12-25 ENCOUNTER — Encounter (HOSPITAL_COMMUNITY): Payer: BLUE CROSS/BLUE SHIELD

## 2015-12-27 ENCOUNTER — Encounter (HOSPITAL_COMMUNITY)
Admission: RE | Admit: 2015-12-27 | Discharge: 2015-12-27 | Disposition: A | Discharge status: Home / Self Care | Payer: BLUE CROSS/BLUE SHIELD | Source: Ambulatory Visit | Attending: Cardiovascular Disease | Admitting: Cardiovascular Disease

## 2015-12-27 DIAGNOSIS — Z955 Presence of coronary angioplasty implant and graft: Secondary | ICD-10-CM | POA: Diagnosis not present

## 2015-12-29 ENCOUNTER — Encounter (HOSPITAL_COMMUNITY)
Admission: RE | Admit: 2015-12-29 | Discharge: 2015-12-29 | Disposition: A | Discharge status: Home / Self Care | Payer: BLUE CROSS/BLUE SHIELD | Source: Ambulatory Visit | Attending: Cardiovascular Disease | Admitting: Cardiovascular Disease

## 2015-12-29 DIAGNOSIS — Z955 Presence of coronary angioplasty implant and graft: Secondary | ICD-10-CM | POA: Diagnosis not present

## 2016-01-01 ENCOUNTER — Encounter (HOSPITAL_COMMUNITY): Payer: BLUE CROSS/BLUE SHIELD

## 2016-01-03 ENCOUNTER — Encounter (HOSPITAL_COMMUNITY)
Admission: RE | Admit: 2016-01-03 | Discharge: 2016-01-03 | Disposition: A | Discharge status: Home / Self Care | Payer: BLUE CROSS/BLUE SHIELD | Source: Ambulatory Visit | Attending: Cardiovascular Disease | Admitting: Cardiovascular Disease

## 2016-01-03 DIAGNOSIS — I2510 Atherosclerotic heart disease of native coronary artery without angina pectoris: Secondary | ICD-10-CM | POA: Insufficient documentation

## 2016-01-03 DIAGNOSIS — Z955 Presence of coronary angioplasty implant and graft: Secondary | ICD-10-CM | POA: Insufficient documentation

## 2016-01-05 ENCOUNTER — Encounter (HOSPITAL_COMMUNITY)
Admission: RE | Admit: 2016-01-05 | Discharge: 2016-01-05 | Disposition: A | Discharge status: Home / Self Care | Payer: BLUE CROSS/BLUE SHIELD | Source: Ambulatory Visit | Attending: Cardiovascular Disease | Admitting: Cardiovascular Disease

## 2016-01-05 DIAGNOSIS — Z955 Presence of coronary angioplasty implant and graft: Secondary | ICD-10-CM | POA: Diagnosis not present

## 2016-01-08 ENCOUNTER — Encounter (HOSPITAL_COMMUNITY): Payer: BLUE CROSS/BLUE SHIELD

## 2016-01-10 ENCOUNTER — Encounter (HOSPITAL_COMMUNITY)
Admission: RE | Admit: 2016-01-10 | Discharge: 2016-01-10 | Disposition: A | Discharge status: Home / Self Care | Payer: BLUE CROSS/BLUE SHIELD | Source: Ambulatory Visit | Attending: Cardiovascular Disease | Admitting: Cardiovascular Disease

## 2016-01-10 DIAGNOSIS — Z955 Presence of coronary angioplasty implant and graft: Secondary | ICD-10-CM | POA: Diagnosis not present

## 2016-01-10 NOTE — Progress Notes (Signed)
Cardiac Rehabilitation Program Outcomes Report   Orientation:  11/01/15 Graduate Date:  tbd Discharge Date:  tbd # of sessions completed: 18  Cardiologist: Gaynelle Cage MD:  Amanda Pea Class Time:  1100  A.  Exercise Program:  Tolerates exercise @ 2.90 METS for 15 minutes  B.  Mental Health:  Good mental attitude  C.  Education/Instruction/Skills  Accurately checks own pulse.  Rest:  92  Exercise:  113 and Knows THR for exercise  Uses Perceived Exertion Scale and/or Dyspnea Scale  D.  Nutrition/Weight Control/Body Composition:  Adherence to prescribed nutrition program: good    E.  Blood Lipids    Lab Results  Component Value Date   CHOL 133 08/15/2015   HDL 32* 08/15/2015   LDLCALC 85 08/15/2015   TRIG 80 08/15/2015   CHOLHDL 4.2 08/15/2015    F.  Lifestyle Changes:  Making positive lifestyle changes  G.  Symptoms noted with exercise:  Asymptomatic  Report Completed By:  Stevphen Rochester RN   Comments:  This is the patients halfway progress note for [facility].  Patient is doing well in the program.

## 2016-01-12 ENCOUNTER — Encounter (HOSPITAL_COMMUNITY)
Admission: RE | Admit: 2016-01-12 | Discharge: 2016-01-12 | Disposition: A | Discharge status: Home / Self Care | Payer: BLUE CROSS/BLUE SHIELD | Source: Ambulatory Visit | Attending: Cardiovascular Disease | Admitting: Cardiovascular Disease

## 2016-01-12 DIAGNOSIS — Z955 Presence of coronary angioplasty implant and graft: Secondary | ICD-10-CM | POA: Diagnosis not present

## 2016-01-15 ENCOUNTER — Encounter (HOSPITAL_COMMUNITY)
Admission: RE | Admit: 2016-01-15 | Discharge: 2016-01-15 | Disposition: A | Discharge status: Home / Self Care | Payer: BLUE CROSS/BLUE SHIELD | Source: Ambulatory Visit | Attending: Cardiovascular Disease | Admitting: Cardiovascular Disease

## 2016-01-15 DIAGNOSIS — Z955 Presence of coronary angioplasty implant and graft: Secondary | ICD-10-CM | POA: Diagnosis not present

## 2016-01-17 ENCOUNTER — Encounter (HOSPITAL_COMMUNITY)
Admission: RE | Admit: 2016-01-17 | Discharge: 2016-01-17 | Disposition: A | Discharge status: Home / Self Care | Payer: BLUE CROSS/BLUE SHIELD | Source: Ambulatory Visit | Attending: Cardiovascular Disease | Admitting: Cardiovascular Disease

## 2016-01-17 DIAGNOSIS — Z955 Presence of coronary angioplasty implant and graft: Secondary | ICD-10-CM | POA: Diagnosis not present

## 2016-01-19 ENCOUNTER — Encounter (HOSPITAL_COMMUNITY)
Admission: RE | Admit: 2016-01-19 | Discharge: 2016-01-19 | Disposition: A | Discharge status: Home / Self Care | Payer: BLUE CROSS/BLUE SHIELD | Source: Ambulatory Visit | Attending: Cardiovascular Disease | Admitting: Cardiovascular Disease

## 2016-01-19 DIAGNOSIS — Z955 Presence of coronary angioplasty implant and graft: Secondary | ICD-10-CM | POA: Diagnosis not present

## 2016-01-22 ENCOUNTER — Encounter (HOSPITAL_COMMUNITY)
Admission: RE | Admit: 2016-01-22 | Discharge: 2016-01-22 | Disposition: A | Discharge status: Home / Self Care | Payer: BLUE CROSS/BLUE SHIELD | Source: Ambulatory Visit | Attending: Cardiovascular Disease | Admitting: Cardiovascular Disease

## 2016-01-22 DIAGNOSIS — Z955 Presence of coronary angioplasty implant and graft: Secondary | ICD-10-CM | POA: Diagnosis not present

## 2016-01-24 ENCOUNTER — Encounter (HOSPITAL_COMMUNITY)
Admission: RE | Admit: 2016-01-24 | Discharge: 2016-01-24 | Disposition: A | Discharge status: Home / Self Care | Payer: BLUE CROSS/BLUE SHIELD | Source: Ambulatory Visit | Attending: Cardiovascular Disease | Admitting: Cardiovascular Disease

## 2016-01-24 DIAGNOSIS — Z955 Presence of coronary angioplasty implant and graft: Secondary | ICD-10-CM | POA: Diagnosis not present

## 2016-01-26 ENCOUNTER — Encounter (HOSPITAL_COMMUNITY)
Admission: RE | Admit: 2016-01-26 | Discharge: 2016-01-26 | Disposition: A | Discharge status: Home / Self Care | Payer: BLUE CROSS/BLUE SHIELD | Source: Ambulatory Visit | Attending: Cardiovascular Disease | Admitting: Cardiovascular Disease

## 2016-01-26 DIAGNOSIS — Z955 Presence of coronary angioplasty implant and graft: Secondary | ICD-10-CM | POA: Diagnosis not present

## 2016-01-29 ENCOUNTER — Encounter (HOSPITAL_COMMUNITY)
Admission: RE | Admit: 2016-01-29 | Discharge: 2016-01-29 | Disposition: A | Discharge status: Home / Self Care | Payer: BLUE CROSS/BLUE SHIELD | Source: Ambulatory Visit | Attending: Cardiovascular Disease | Admitting: Cardiovascular Disease

## 2016-01-29 DIAGNOSIS — Z955 Presence of coronary angioplasty implant and graft: Secondary | ICD-10-CM | POA: Diagnosis not present

## 2016-01-31 ENCOUNTER — Encounter (HOSPITAL_COMMUNITY)
Admission: RE | Admit: 2016-01-31 | Discharge: 2016-01-31 | Disposition: A | Discharge status: Home / Self Care | Payer: BLUE CROSS/BLUE SHIELD | Source: Ambulatory Visit | Attending: Cardiovascular Disease | Admitting: Cardiovascular Disease

## 2016-01-31 DIAGNOSIS — Z955 Presence of coronary angioplasty implant and graft: Secondary | ICD-10-CM | POA: Insufficient documentation

## 2016-01-31 DIAGNOSIS — I2510 Atherosclerotic heart disease of native coronary artery without angina pectoris: Secondary | ICD-10-CM | POA: Diagnosis not present

## 2016-02-02 ENCOUNTER — Encounter (HOSPITAL_COMMUNITY)
Admission: RE | Admit: 2016-02-02 | Discharge: 2016-02-02 | Disposition: A | Discharge status: Home / Self Care | Payer: BLUE CROSS/BLUE SHIELD | Source: Ambulatory Visit | Attending: Cardiovascular Disease | Admitting: Cardiovascular Disease

## 2016-02-02 DIAGNOSIS — Z955 Presence of coronary angioplasty implant and graft: Secondary | ICD-10-CM | POA: Diagnosis not present

## 2016-02-05 ENCOUNTER — Encounter (HOSPITAL_COMMUNITY)
Admission: RE | Admit: 2016-02-05 | Discharge: 2016-02-05 | Disposition: A | Discharge status: Home / Self Care | Payer: BLUE CROSS/BLUE SHIELD | Source: Ambulatory Visit | Attending: Cardiovascular Disease | Admitting: Cardiovascular Disease

## 2016-02-05 DIAGNOSIS — Z955 Presence of coronary angioplasty implant and graft: Secondary | ICD-10-CM | POA: Diagnosis not present

## 2016-02-07 ENCOUNTER — Encounter (HOSPITAL_COMMUNITY)
Admission: RE | Admit: 2016-02-07 | Discharge: 2016-02-07 | Disposition: A | Discharge status: Home / Self Care | Payer: BLUE CROSS/BLUE SHIELD | Source: Ambulatory Visit | Attending: Cardiovascular Disease | Admitting: Cardiovascular Disease

## 2016-02-07 DIAGNOSIS — Z955 Presence of coronary angioplasty implant and graft: Secondary | ICD-10-CM | POA: Diagnosis not present

## 2016-02-09 ENCOUNTER — Encounter (HOSPITAL_COMMUNITY)
Admission: RE | Admit: 2016-02-09 | Discharge: 2016-02-09 | Disposition: A | Discharge status: Home / Self Care | Payer: BLUE CROSS/BLUE SHIELD | Source: Ambulatory Visit | Attending: Cardiovascular Disease | Admitting: Cardiovascular Disease

## 2016-02-09 DIAGNOSIS — Z955 Presence of coronary angioplasty implant and graft: Secondary | ICD-10-CM | POA: Diagnosis not present

## 2016-02-12 ENCOUNTER — Ambulatory Visit: Payer: BLUE CROSS/BLUE SHIELD | Admitting: Cardiovascular Disease

## 2016-02-12 ENCOUNTER — Encounter (HOSPITAL_COMMUNITY)
Admission: RE | Admit: 2016-02-12 | Discharge: 2016-02-12 | Disposition: A | Discharge status: Home / Self Care | Payer: BLUE CROSS/BLUE SHIELD | Source: Ambulatory Visit | Attending: Cardiovascular Disease | Admitting: Cardiovascular Disease

## 2016-02-12 DIAGNOSIS — Z955 Presence of coronary angioplasty implant and graft: Secondary | ICD-10-CM | POA: Diagnosis not present

## 2016-02-14 ENCOUNTER — Encounter (HOSPITAL_COMMUNITY)
Admission: RE | Admit: 2016-02-14 | Discharge: 2016-02-14 | Disposition: A | Discharge status: Home / Self Care | Payer: BLUE CROSS/BLUE SHIELD | Source: Ambulatory Visit | Attending: Cardiovascular Disease | Admitting: Cardiovascular Disease

## 2016-02-14 DIAGNOSIS — Z955 Presence of coronary angioplasty implant and graft: Secondary | ICD-10-CM | POA: Diagnosis not present

## 2016-02-16 ENCOUNTER — Encounter (HOSPITAL_COMMUNITY)
Admission: RE | Admit: 2016-02-16 | Discharge: 2016-02-16 | Disposition: A | Discharge status: Home / Self Care | Payer: BLUE CROSS/BLUE SHIELD | Source: Ambulatory Visit | Attending: Cardiovascular Disease | Admitting: Cardiovascular Disease

## 2016-02-16 DIAGNOSIS — Z955 Presence of coronary angioplasty implant and graft: Secondary | ICD-10-CM | POA: Diagnosis not present

## 2016-02-19 ENCOUNTER — Encounter (HOSPITAL_COMMUNITY)
Admission: RE | Admit: 2016-02-19 | Discharge: 2016-02-19 | Disposition: A | Discharge status: Home / Self Care | Payer: BLUE CROSS/BLUE SHIELD | Source: Ambulatory Visit | Attending: Cardiovascular Disease | Admitting: Cardiovascular Disease

## 2016-02-19 DIAGNOSIS — Z955 Presence of coronary angioplasty implant and graft: Secondary | ICD-10-CM | POA: Diagnosis not present

## 2016-02-21 ENCOUNTER — Encounter (HOSPITAL_COMMUNITY)
Admission: RE | Admit: 2016-02-21 | Discharge: 2016-02-21 | Disposition: A | Discharge status: Home / Self Care | Payer: BLUE CROSS/BLUE SHIELD | Source: Ambulatory Visit | Attending: Cardiovascular Disease | Admitting: Cardiovascular Disease

## 2016-02-21 DIAGNOSIS — Z955 Presence of coronary angioplasty implant and graft: Secondary | ICD-10-CM | POA: Diagnosis not present

## 2016-02-23 ENCOUNTER — Encounter (HOSPITAL_COMMUNITY): Payer: BLUE CROSS/BLUE SHIELD

## 2016-02-27 ENCOUNTER — Encounter: Payer: Self-pay | Admitting: Cardiovascular Disease

## 2016-02-27 ENCOUNTER — Ambulatory Visit (INDEPENDENT_AMBULATORY_CARE_PROVIDER_SITE_OTHER): Payer: BLUE CROSS/BLUE SHIELD | Admitting: Cardiovascular Disease

## 2016-02-27 VITALS — BP 138/88 | HR 94 | Ht 64.0 in | Wt 148.0 lb

## 2016-02-27 DIAGNOSIS — I25708 Atherosclerosis of coronary artery bypass graft(s), unspecified, with other forms of angina pectoris: Secondary | ICD-10-CM

## 2016-02-27 DIAGNOSIS — E785 Hyperlipidemia, unspecified: Secondary | ICD-10-CM

## 2016-02-27 DIAGNOSIS — I5022 Chronic systolic (congestive) heart failure: Secondary | ICD-10-CM | POA: Diagnosis not present

## 2016-02-27 DIAGNOSIS — I10 Essential (primary) hypertension: Secondary | ICD-10-CM | POA: Diagnosis not present

## 2016-02-27 MED ORDER — ROSUVASTATIN CALCIUM 20 MG PO TABS: 20.0000 mg | ORAL_TABLET | Freq: Every day | ORAL | Status: DC

## 2016-02-27 NOTE — Progress Notes (Signed)
Patient ID: Adam Shelton, male   DOB: 09/17/1951, 65 y.o.   MRN: [id number]      SUBJECTIVE: The patient presents for follow-up for coronary artery disease with 5-vessel CABG, and chronic systolic heart failure.  Wt today 148 lbs (144 on 12/05/15).  Denies chest pain and shortness of breath. Had some abdominal pain and saw his PCP yesterday who ordered some blood tests and an abdominal ultrasound. I do not have these results at the present time. He enjoyed cardiac rehabilitation.   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 3 sons, one studying biology in [place], one son doing PhD in Estate manager/land agent, another son doing PhD in Engineer, drilling.   Review of Systems: As per "subjective", otherwise negative.  Allergies  Allergen Reactions  . Lipitor [Atorvastatin] Other (See Comments)    Myalgias  . Lisinopril Other (See Comments)    Angioedema    Current Outpatient Prescriptions  Medication Sig Dispense Refill  . acetaminophen (TYLENOL) 500 MG tablet Take 1,000 mg by mouth every 6 (six) hours as needed.    . ALPRAZolam (XANAX) 0.5 MG tablet Take 1 tablet by mouth at bedtime.  0  . aspirin 81 MG EC tablet Take 81 mg by mouth daily.      . clopidogrel (PLAVIX) 75 MG tablet Take 1 tablet (75 mg total) by mouth daily. 90 tablet 6  . FLUoxetine (PROZAC) 20 MG tablet Take 1 tablet by mouth daily.  0  . folic acid (PX FOLIC ACID) A999333 MCG tablet Take 400 mcg by mouth daily.      Marland Kitchen levothyroxine (SYNTHROID, LEVOTHROID) 100 MCG tablet Take 100 mcg by mouth daily before breakfast.    . metoprolol succinate (TOPROL XL) 25 MG 24 hr tablet Take 0.5 tablets (12.5 mg total) by mouth 2 (two) times daily. 180 tablet 6  . Multiple Vitamins-Minerals (PRESERVISION AREDS 2 PO) Take 1 tablet by mouth every other day.    . nitroGLYCERIN (NITROSTAT) 0.4 MG SL tablet Place 0.4 mg under the tongue every 5 (five) minutes as needed for chest pain.    .  rosuvastatin (CRESTOR) 10 MG tablet Take 1 tablet (10 mg total) by mouth daily. 90 tablet 3  . vitamin B-12 (CYANOCOBALAMIN) 500 MCG tablet Take 500 mcg by mouth daily.    . vitamin E 200 UNIT capsule Take 200 Units by mouth daily.     No current facility-administered medications for this visit.    Past Medical History  Diagnosis Date  . Hypertension   . Obesity   . Dyslipidemia   . Hypothyroidism   . Pulmonary valve disorders   . Mitral valve disorders   . Heart disease, unspecified   . Heart attack ([facility]) 09/24/94  . Arthritis   . Stroke ([facility])   . H/O: CVA (cerebrovascular accident), 06/22/15 Rt frontal rec'd TPA 08/14/2015    Past Surgical History  Procedure Laterality Date  . Hernia repair      age 38  . Cardiac catheterization  12/10/94  . Cardiac catheterization N/A 08/14/2015    Procedure: Left Heart Cath and Coronary Angiography;  Surgeon: Sherren Mocha, MD;  Location: [facility];  Service: Cardiovascular;  Laterality: N/A;  . Coronary artery bypass graft N/A 08/18/2015    Procedure: CORONARY ARTERY BYPASS GRAFTING (CABG)  X 5 UTILIZING THE LEFT INTERNAL MAMMARY ARTERY AND ENDOSCOPICALLY HARVESTED RIGHT AND LEFT SAPHENEOUS VEINS.;  Surgeon: Melrose Nakayama, MD;  Location: [facility];  Service: Open Heart  Surgery;  Laterality: N/A;  . Tee without cardioversion N/A 08/18/2015    Procedure: TRANSESOPHAGEAL ECHOCARDIOGRAM (TEE);  Surgeon: Melrose Nakayama, MD;  Location: [facility];  Service: Open Heart Surgery;  Laterality: N/A;  . Cardiac catheterization N/A 11/24/2015    Procedure: Left Heart Cath and Cors/Grafts Angiography;  Surgeon: Troy Sine, MD;  Location: [facility];  Service: Cardiovascular;  Laterality: N/A;  . Cardiac catheterization  11/24/2015    Procedure: Coronary Stent Intervention;  Surgeon: Troy Sine, MD;  Location: [facility];  Service: Cardiovascular;;    Social History   Social History  . Marital Status: Married     Spouse Name: N/A  . Number of Children: N/A  . Years of Education: N/A   Occupational History  . Not on file.   Social History Main Topics  . Smoking status: Never Smoker   . Smokeless tobacco: Never Used  . Alcohol Use: No  . Drug Use: No  . Sexual Activity: Not on file   Other Topics Concern  . Not on file   Social History Narrative   Retired   Has 3 children   Married to [employer]   Regular exercise--yes 5 days/week 97min     Filed Vitals:   02/27/16 1148  BP: 138/88  Pulse: 94  Height: 5\' 4"  (1.626 m)  Weight: 148 lb (67.132 kg)  SpO2: 95%    PHYSICAL EXAM General: NAD HEENT: Normal. Neck: No JVD, no thyromegaly. Lungs: Clear to auscultation bilaterally with normal respiratory effort. CV: Nondisplaced PMI. Regular rate and rhythm, normal S1/S2, no S3/S4, no murmur. No pretibial or periankle edema. No carotid bruit. Well healed midline sternotomy incision.  Abdomen: Soft, nontender, no distention.  Neurologic: Alert and oriented x 3.  Psych: Normal affect. Skin: Normal. Musculoskeletal: Normal range of motion, no gross deformities. Extremities: No clubbing or cyanosis.   ECG: Most recent ECG reviewed.      ASSESSMENT AND PLAN: 1. CAD with s/p 5-vessel CABG with recent PCI: Symptomatically stable. Continue ASA, Toprol-XL, Crestor, and Plavix. P2Y12 146 on 08/14/15, indicating adequate platelet inhibition.  As he is tolerating Crestor 10 mg, I will increase to 20 mg. Will likely continue dual antiplatelet therapy indefinitely.  2. Essential HTN: Stable. No changes.  3. Dyslipidemia: Lipids previously reviewed. As he is tolerating Crestor 10 mg, I will increase to 20 mg.  Lipitor caused myalgias.  4. CVA: BP controlled. Continue Plavix.  5. Chronic systolic heart failure, EF 35-40%: Euvolemic on Lasix 20 mg on alternate days. Will continue Toprol-XL at present dose.   Dispo: f/u 6 months.   Kate Sable, M.D., F.A.C.C.

## 2016-02-27 NOTE — Patient Instructions (Signed)
   Increase Crestor to 20mg  daily - new sent to [facility] today. Continue all other medications.    Your physician wants you to follow up in: 6 months.  You will receive a reminder letter in the mail one-two months in advance.  If you don't receive a letter, please call our office to schedule the follow up appointment

## 2016-03-05 NOTE — Progress Notes (Signed)
Cardiac Rehabilitation Program Outcomes Report   Orientation:  11/01/15 Graduate Date:  02/21/16 Discharge Date:  02/21/16 # of sessions completed: 36  Cardiologist: Bronson Ing Family MD:  hasanaj Class Time:  1100  A.  Exercise Program:  Tolerates exercise @ 3.44 METS for 15 minutes, Walk Test Results:  Post: 3.11 mets, Improved functional capacity  81.25 % and Improved  muscular strength  8.33 %  B.  Mental Health:  Good mental attitude, Quality of Life (QOL)  improvements:  Overall  24.26 %, Health/Functioning 24.23 %, Socioeconomics 22.5 %, Psych/Spiritual 24.86 %, Family 26.88 %   and PHQ-9: 1  C.  Education/Instruction/Skills  Accurately checks own pulse.  Rest:  79  Exercise:  123, Knows THR for exercise and Attended 13 education classes  Uses Perceived Exertion Scale and/or Dyspnea Scale  D.  Nutrition/Weight Control/Body Composition:  Adherence to prescribed nutrition program: good Maintained  weight   E.  Blood Lipids    Lab Results  Component Value Date   CHOL 133 08/15/2015   HDL 32* 08/15/2015   LDLCALC 85 08/15/2015   TRIG 80 08/15/2015   CHOLHDL 4.2 08/15/2015    F.  Lifestyle Changes:  Making positive lifestyle changes  G.  Symptoms noted with exercise:  Asymptomatic  Report Completed By:  Stevphen Rochester RN   Comments:  This is the patients graduation note. Patient progressed very well in the program.  Patient plans to exercise at home.

## 2016-03-05 NOTE — Progress Notes (Signed)
Patient is discharged from [facility] and Pulmonary program today, 02/21/16 with 36 sessions.  He achieved LTG of 30 minutes of aerobic exercise at max met level of 3.44.  Patient has not met with dietician.  Discharge instructions have been reviewed in detail and patient expressed an understanding of material given.  Patient plans to exercise at home. Cardiac Rehab will make 1 month, 6 month and 1 year call backs.  Patient had no complaints of any abnormal S/S or pain on their exit visit.

## 2016-07-29 ENCOUNTER — Telehealth: Payer: Self-pay | Admitting: Cardiovascular Disease

## 2016-07-29 MED ORDER — ROSUVASTATIN CALCIUM 20 MG PO TABS: 20.0000 mg | ORAL_TABLET | Freq: Every day | ORAL | 6 refills | Status: DC

## 2016-07-29 NOTE — Telephone Encounter (Signed)
Patient called stating that he needs refill on rosuvastatin (CRESTOR) 20 MG tablet

## 2016-09-12 ENCOUNTER — Ambulatory Visit (INDEPENDENT_AMBULATORY_CARE_PROVIDER_SITE_OTHER): Payer: Medicaid Other | Admitting: Cardiovascular Disease

## 2016-09-12 ENCOUNTER — Encounter: Payer: Self-pay | Admitting: Cardiovascular Disease

## 2016-09-12 VITALS — BP 124/84 | HR 96 | Ht 64.0 in | Wt 153.0 lb

## 2016-09-12 DIAGNOSIS — I25708 Atherosclerosis of coronary artery bypass graft(s), unspecified, with other forms of angina pectoris: Secondary | ICD-10-CM | POA: Diagnosis not present

## 2016-09-12 DIAGNOSIS — I10 Essential (primary) hypertension: Secondary | ICD-10-CM

## 2016-09-12 DIAGNOSIS — I5022 Chronic systolic (congestive) heart failure: Secondary | ICD-10-CM | POA: Diagnosis not present

## 2016-09-12 DIAGNOSIS — Z951 Presence of aortocoronary bypass graft: Secondary | ICD-10-CM

## 2016-09-12 DIAGNOSIS — E785 Hyperlipidemia, unspecified: Secondary | ICD-10-CM

## 2016-09-12 NOTE — Patient Instructions (Signed)

## 2016-09-12 NOTE — Progress Notes (Signed)
SUBJECTIVE: The patient presents for follow-up for coronary artery disease with 5-vessel CABG, and chronic systolic heart failure.  Wt today 153 lbs (148 on 02/27/16).  Denies chest pain and shortness of breath. His wife says he is doing very well.  I reviewed labs performed on 08/02/16: Total cholesterol 119, triglycerides 114, HDL 40, LDL 56, A1c 5.8%, BUN 20, creatinine 1, TSH 3.8.  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 3 sons who now all live in [place], one son is a PhD in Estate manager/land agent, another son is a PhD in Engineer, drilling.    Review of Systems: As per "subjective", otherwise negative.  Allergies  Allergen Reactions  . Lipitor [Atorvastatin] Other (See Comments)    Myalgias  . Lisinopril Other (See Comments)    Angioedema    Current Outpatient Prescriptions  Medication Sig Dispense Refill  . acetaminophen (TYLENOL) 500 MG tablet Take 1,000 mg by mouth every 6 (six) hours as needed.    . ALPRAZolam (XANAX) 0.5 MG tablet Take 1 tablet by mouth at bedtime.  0  . aspirin 81 MG EC tablet Take 81 mg by mouth daily.      . clopidogrel (PLAVIX) 75 MG tablet Take 1 tablet (75 mg total) by mouth daily. 90 tablet 6  . folic acid (PX FOLIC ACID) A999333 MCG tablet Take 400 mcg by mouth daily.      Marland Kitchen levothyroxine (SYNTHROID, LEVOTHROID) 100 MCG tablet Take 100 mcg by mouth daily before breakfast.    . metoprolol succinate (TOPROL XL) 25 MG 24 hr tablet Take 0.5 tablets (12.5 mg total) by mouth 2 (two) times daily. 180 tablet 6  . Multiple Vitamins-Minerals (PRESERVISION AREDS 2 PO) Take 1 tablet by mouth every other day.    . nitroGLYCERIN (NITROSTAT) 0.4 MG SL tablet Place 0.4 mg under the tongue every 5 (five) minutes as needed for chest pain.    . rosuvastatin (CRESTOR) 20 MG tablet Take 1 tablet (20 mg total) by mouth daily. 30 tablet 6  . vitamin B-12 (CYANOCOBALAMIN) 500 MCG tablet Take 500 mcg by mouth daily.      . vitamin E 200 UNIT capsule Take 200 Units by mouth daily.     No current facility-administered medications for this visit.     Past Medical History:  Diagnosis Date  . Arthritis   . Dyslipidemia   . H/O: CVA (cerebrovascular accident), 06/22/15 Rt frontal rec'd TPA 08/14/2015  . Heart attack ([facility]) 09/24/94  . Heart disease, unspecified   . Hypertension   . Hypothyroidism   . Mitral valve disorders   . Obesity   . Pulmonary valve disorders   . Stroke [facility])     Past Surgical History:  Procedure Laterality Date  . CARDIAC CATHETERIZATION  12/10/94  . CARDIAC CATHETERIZATION N/A 08/14/2015   Procedure: Left Heart Cath and Coronary Angiography;  Surgeon: Sherren Mocha, MD;  Location: [facility];  Service: Cardiovascular;  Laterality: N/A;  . CARDIAC CATHETERIZATION N/A 11/24/2015   Procedure: Left Heart Cath and Cors/Grafts Angiography;  Surgeon: Troy Sine, MD;  Location: [facility];  Service: Cardiovascular;  Laterality: N/A;  . CARDIAC CATHETERIZATION  11/24/2015   Procedure: Coronary Stent Intervention;  Surgeon: Troy Sine, MD;  Location: [facility];  Service: Cardiovascular;;  . CORONARY ARTERY BYPASS GRAFT N/A 08/18/2015   Procedure: CORONARY ARTERY BYPASS GRAFTING (CABG)  X 5 UTILIZING THE LEFT INTERNAL MAMMARY ARTERY AND ENDOSCOPICALLY  HARVESTED RIGHT AND LEFT SAPHENEOUS VEINS.;  Surgeon: Melrose Nakayama, MD;  Location: [facility];  Service: Open Heart Surgery;  Laterality: N/A;  . HERNIA REPAIR     age 36  . TEE WITHOUT CARDIOVERSION N/A 08/18/2015   Procedure: TRANSESOPHAGEAL ECHOCARDIOGRAM (TEE);  Surgeon: Melrose Nakayama, MD;  Location: [facility];  Service: Open Heart Surgery;  Laterality: N/A;    Social History   Social History  . Marital status: Married    Spouse name: N/A  . Number of children: N/A  . Years of education: N/A   Occupational History  . Not on file.   Social History Main Topics  . Smoking status: Never Smoker   . Smokeless tobacco: Never Used  . Alcohol use No  . Drug use: No  . Sexual activity: Yes    Partners: Female   Other Topics Concern  . Not on file   Social History Narrative   Retired   Has 3 children   Married to [employer]   Regular exercise--yes 5 days/week 39min     Vitals:   09/12/16 1101  BP: 124/84  Pulse: 96  SpO2: (!) 74%  Weight: 153 lb (69.4 kg)  Height: 5\' 4"  (1.626 m)    PHYSICAL EXAM General: NAD HEENT: Normal. Neck: No JVD, no thyromegaly. Lungs: Clear to auscultation bilaterally with normal respiratory effort. CV: Nondisplaced PMI.  Regular rate and rhythm, normal S1/S2, no S3/S4, no murmur. No pretibial or periankle edema.  No carotid bruit.   Abdomen: Soft, nontender, no distention.  Neurologic: Alert and oriented.  Psych: Normal affect. Skin: Normal. Musculoskeletal: No gross deformities.    ECG: Most recent ECG reviewed.      ASSESSMENT AND PLAN: 1. CAD with s/p 5-vessel CABG with recent PCI: Symptomatically stable. Continue ASA, Toprol-XL, Crestor, and Plavix. Will likely continue dual antiplatelet therapy indefinitely.  2. Essential HTN: Stable. No changes.  3. Dyslipidemia: 08/02/16-Total cholesterol 119, triglycerides 114, HDL 40, LDL 56,Continue Crestor 20 mg.  Lipitor caused myalgias.  4. CVA: BP controlled. Continue Plavix.  5. Chronic systolic heart failure, EF 35-40%: Euvolemic on Lasix 20 mg on alternate days. Will continue Toprol-XL at present dose.   Dispo: f/u 1 year.   Kate Sable, M.D., F.A.C.C.

## 2016-11-30 ENCOUNTER — Other Ambulatory Visit: Payer: Self-pay | Admitting: Physician Assistant

## 2016-12-04 ENCOUNTER — Other Ambulatory Visit: Payer: Self-pay | Admitting: Physician Assistant

## 2017-02-18 IMAGING — DX DG CHEST 1V PORT
1 series · 1 of 1 positions shown · non-contrast
Comparison: Single view of the chest 07/29/2015. PA and lateral
chest 12/30/2013.

CLINICAL DATA: Chest pain and shortness of breath for 2 days.

EXAM:
PORTABLE CHEST - 1 VIEW

[chest ap]
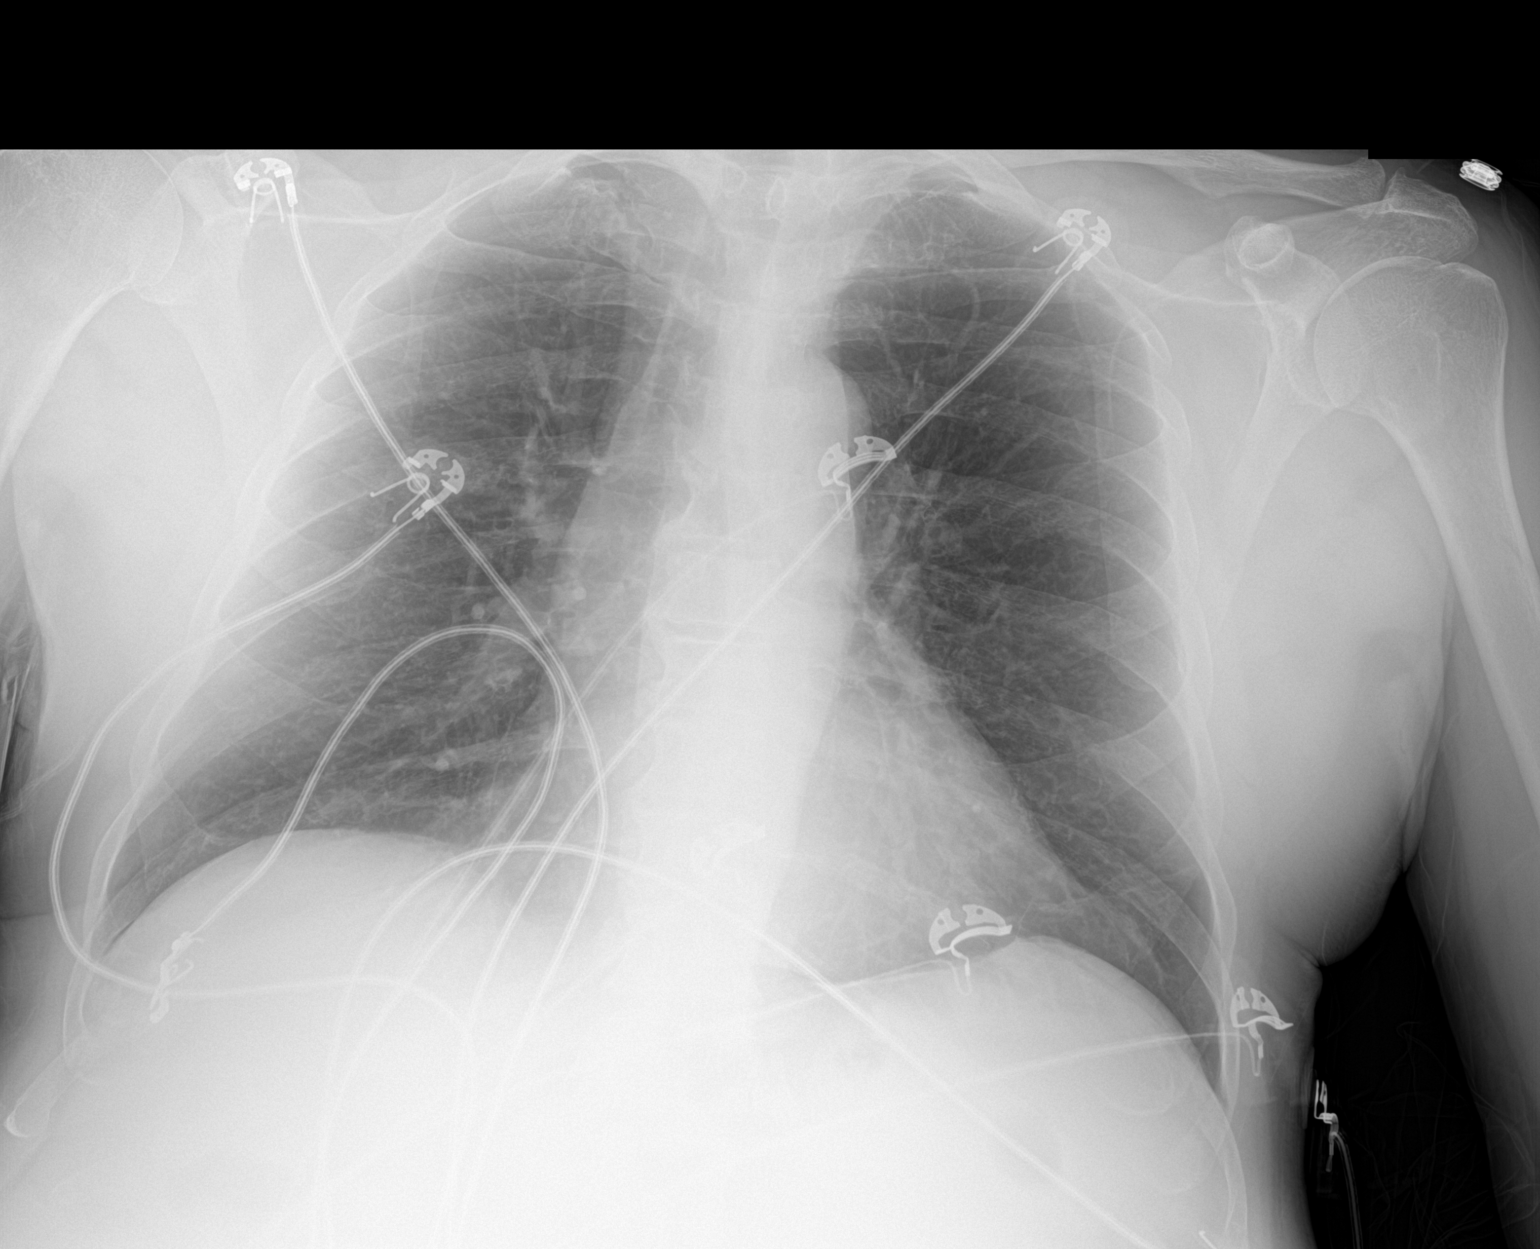

[1 of 1 positions shown; findings below may reference images not displayed]

FINDINGS: The lungs are clear. Heart size is normal. No pneumothorax or
pleural effusion. No focal bony abnormality.
IMPRESSION: Negative chest.

## 2017-02-25 IMAGING — CR DG CHEST 2V
2 series · 2 of 2 positions shown · non-contrast
Comparison: Portable chest x-ray August 20, 2015

CLINICAL DATA: Shortness of breath, status post CABG on August 18, 2015

EXAM:
CHEST  2 VIEW

[chest lat]
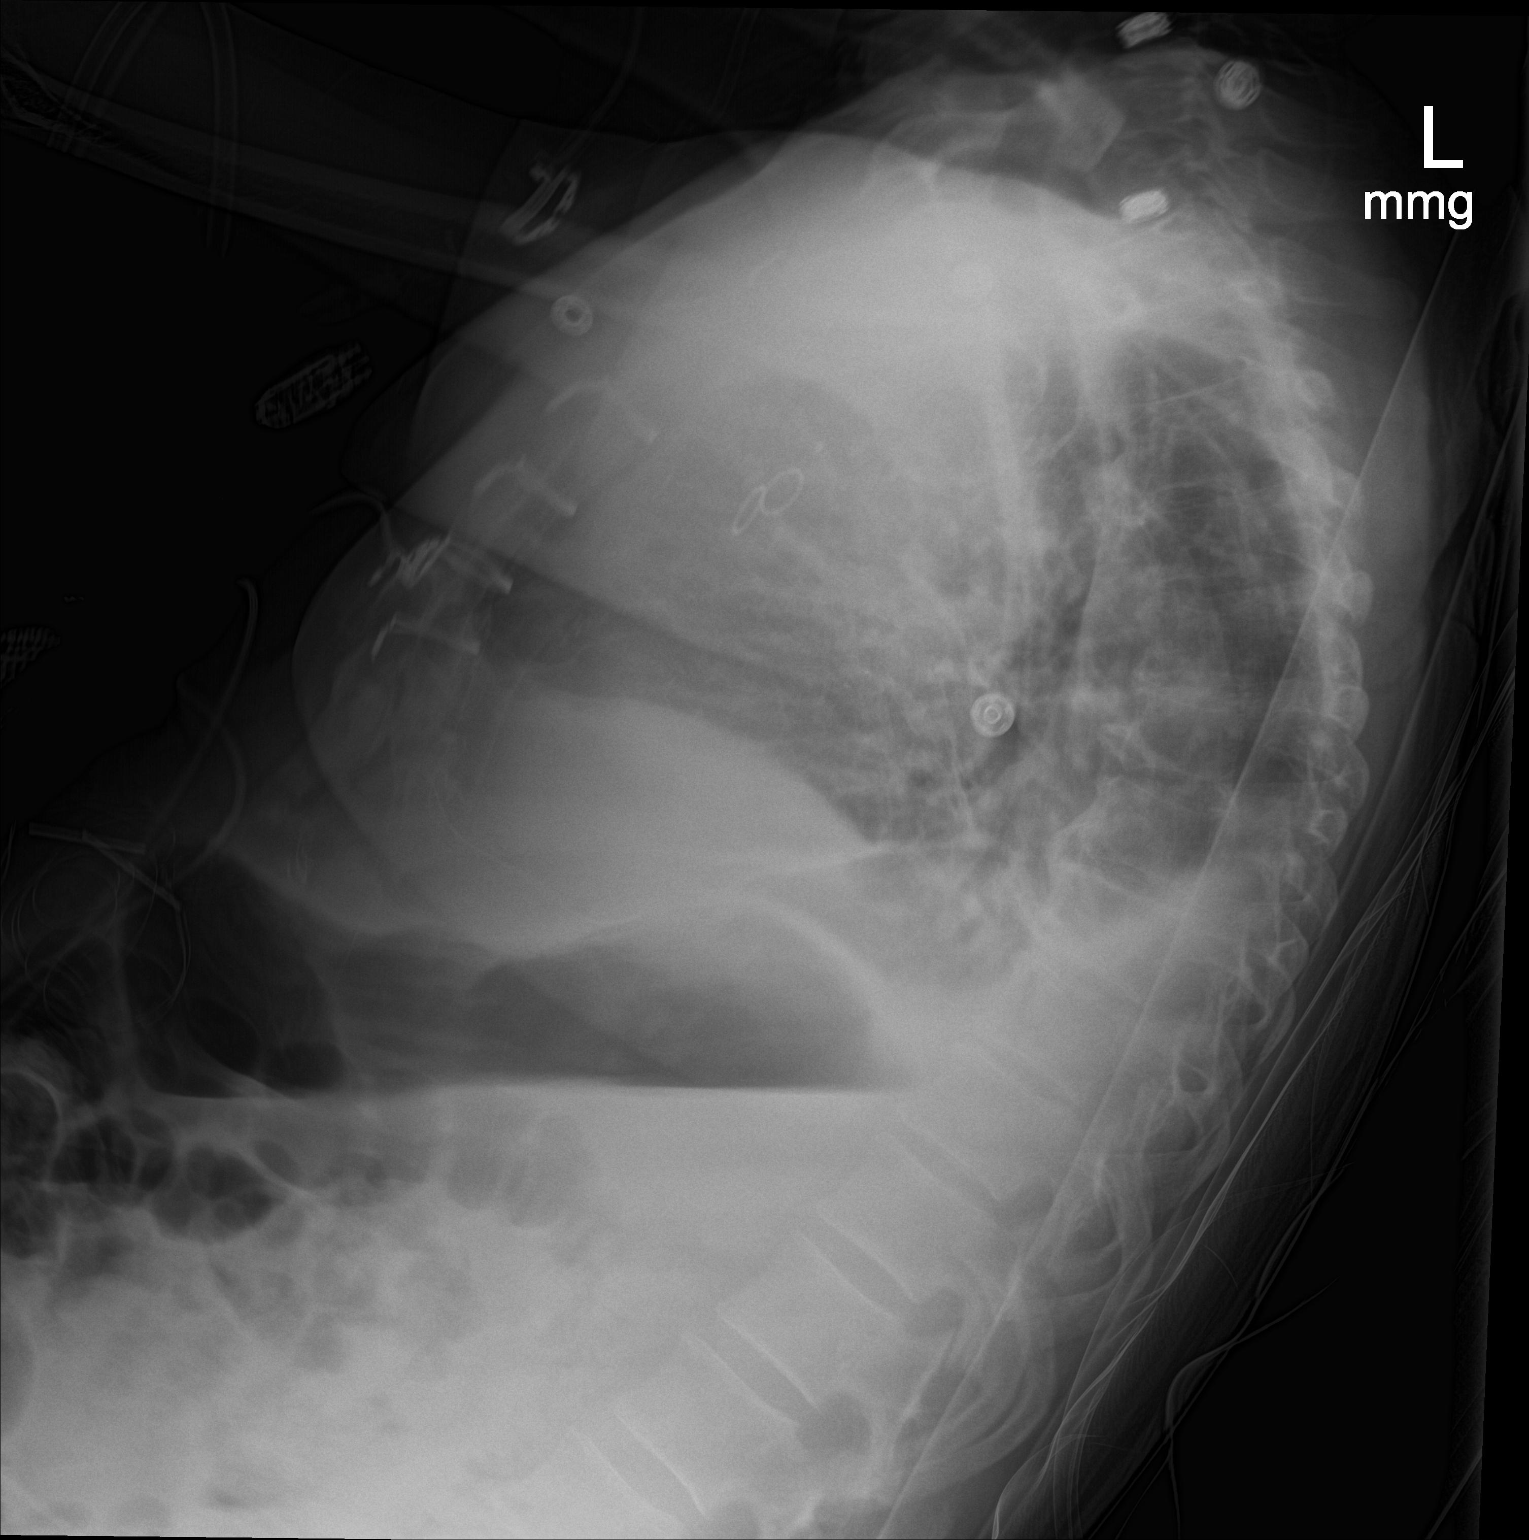

[chest ap]
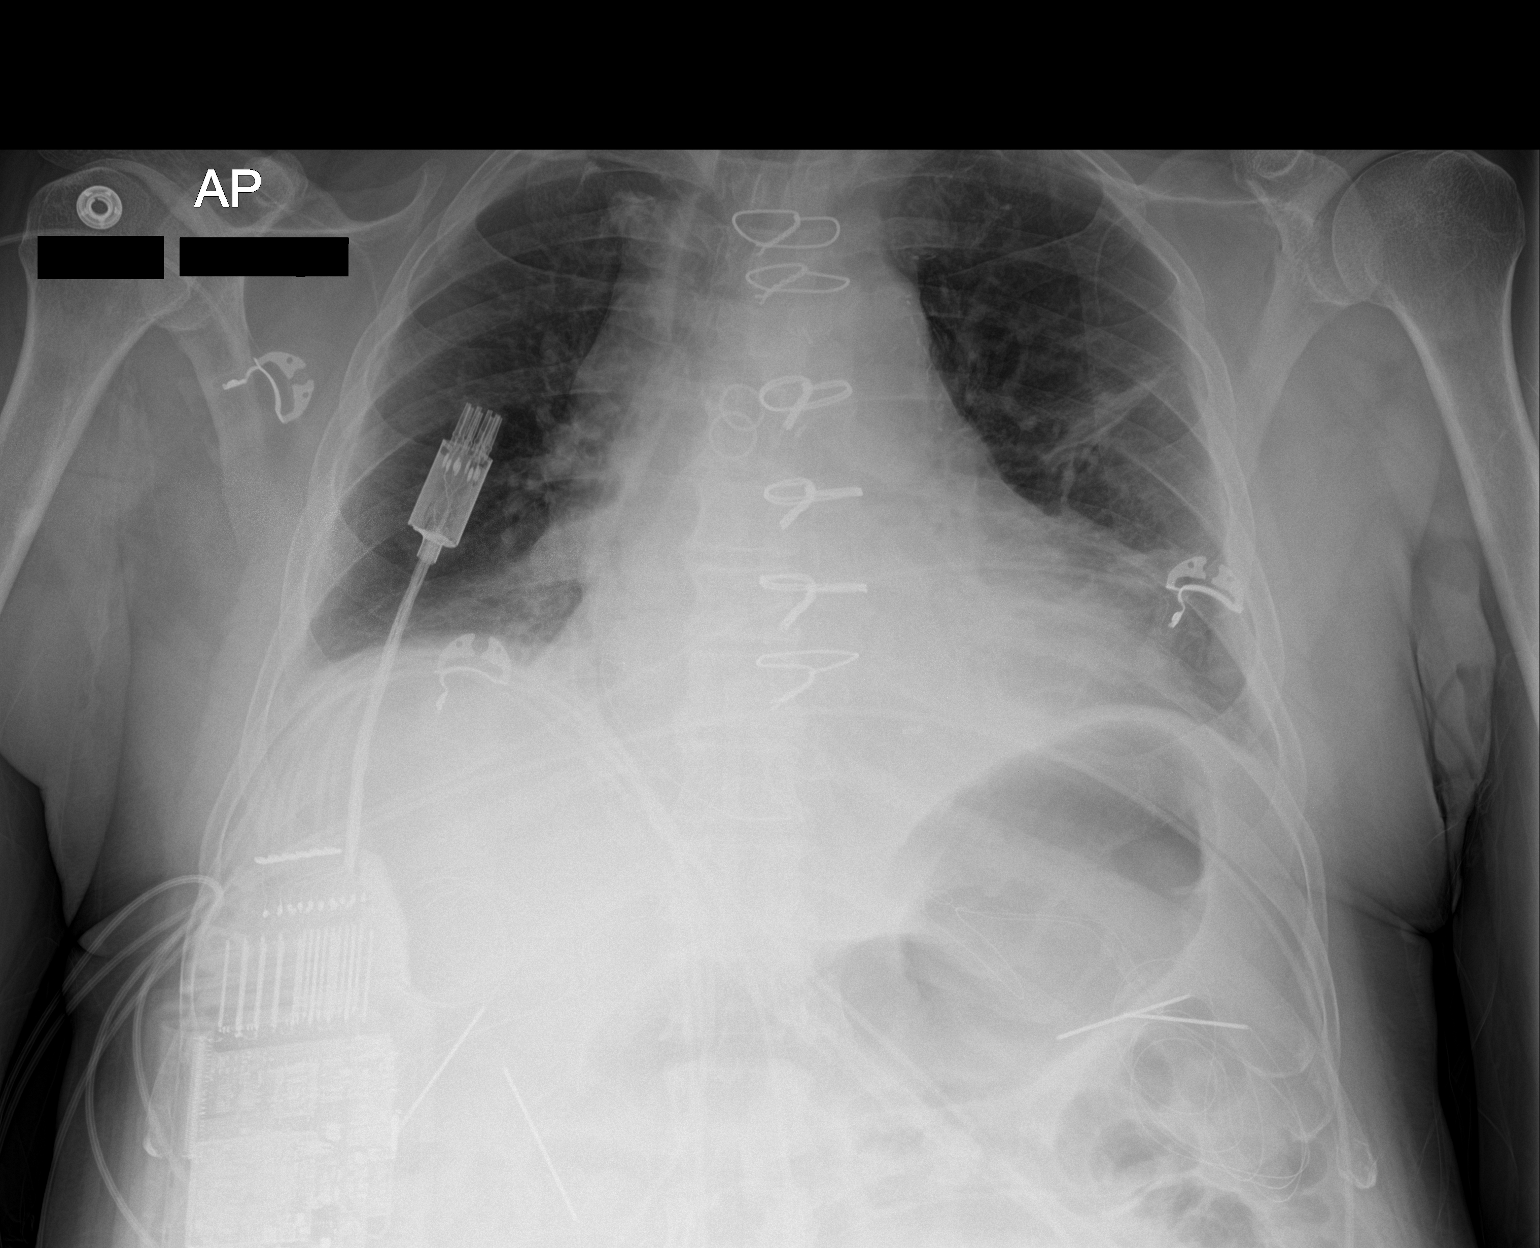

[2 of 2 positions shown; findings below may reference images not displayed]

FINDINGS: The lungs are mildly hypoinflated. There are small bilateral pleural
effusions layering posteriorly. The retrocardiac region rim remains
dense bilaterally but has improved. The cardiac silhouette remains
enlarged. The pulmonary vascularity is not engorged. There are 6
intact sternal wires. The right internal jugular Cordis sheath has
been removed.
IMPRESSION: Interval improvement in pulmonary interstitial edema. There remains
bilateral subsegmental atelectasis as well as small bilateral
pleural effusions.

## 2017-04-10 ENCOUNTER — Other Ambulatory Visit: Payer: Self-pay | Admitting: Cardiovascular Disease

## 2017-04-10 MED ORDER — ROSUVASTATIN CALCIUM 20 MG PO TABS: 20.0000 mg | ORAL_TABLET | Freq: Every day | ORAL | 3 refills | Status: DC

## 2017-04-10 NOTE — Telephone Encounter (Signed)
Refill sent to [facility] for 90 day supply.  Patient made aware.

## 2017-04-10 NOTE — Telephone Encounter (Signed)
° ° ° °  1. Which medications need to be refilled? (please list name of each medication and dose if known)    rosuvastatin (CRESTOR) 20 MG tablet    2. Which pharmacy/location (including street and city if local pharmacy) is medication to be sent to? [facility]   [place] [place]  3. Do they need a 30 day or 90 day supply?  Patient is changing his pharmacy to [place], [place]  He would also like a call back.

## 2017-05-05 IMAGING — DX DG CHEST 2V
2 series · 2 of 2 positions shown · non-contrast
Comparison: Chest radiograph September 26, 2015

CLINICAL DATA: Chest pain and shortness of breath beginning
yesterday.

EXAM:
CHEST  2 VIEW

[chest pa]
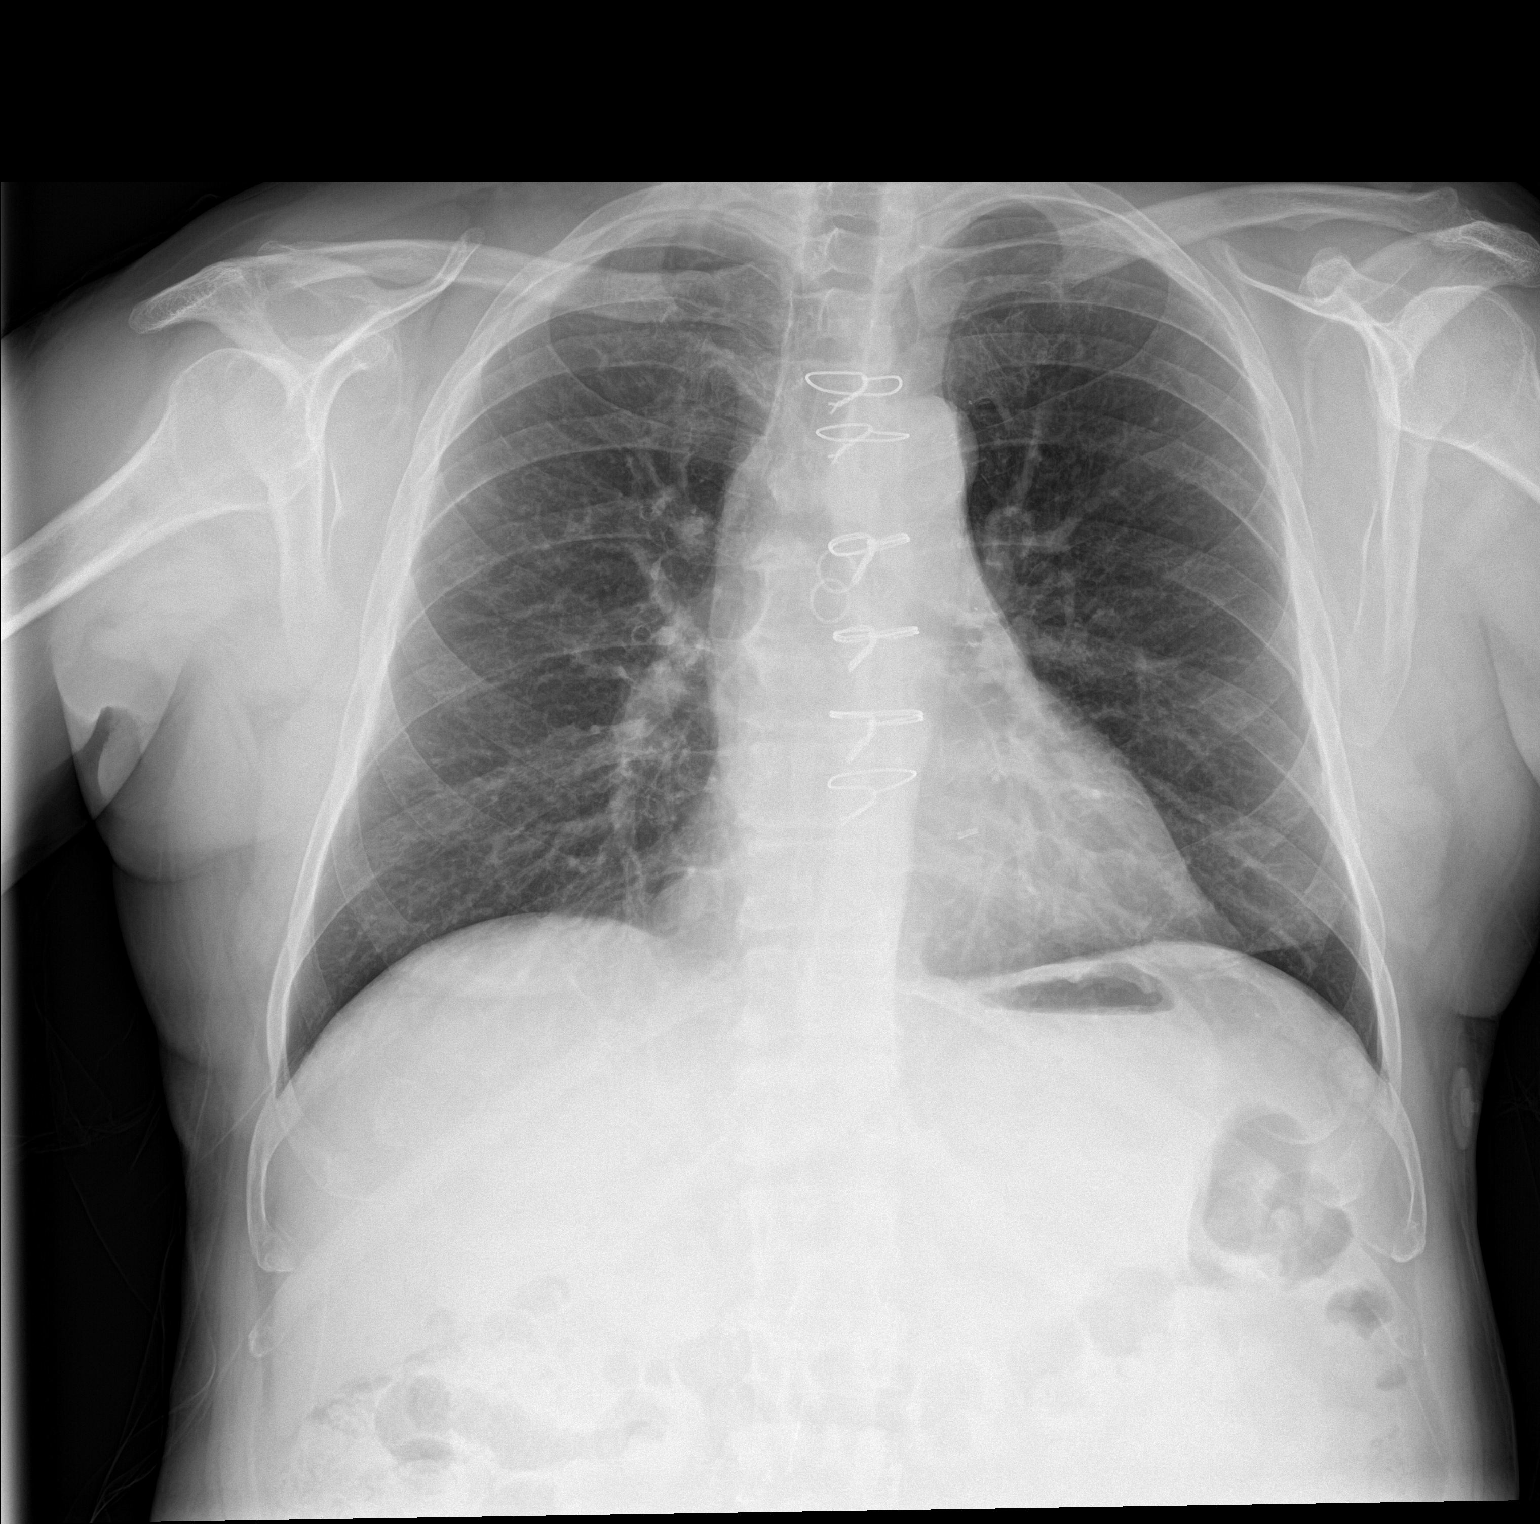

[chest lat]
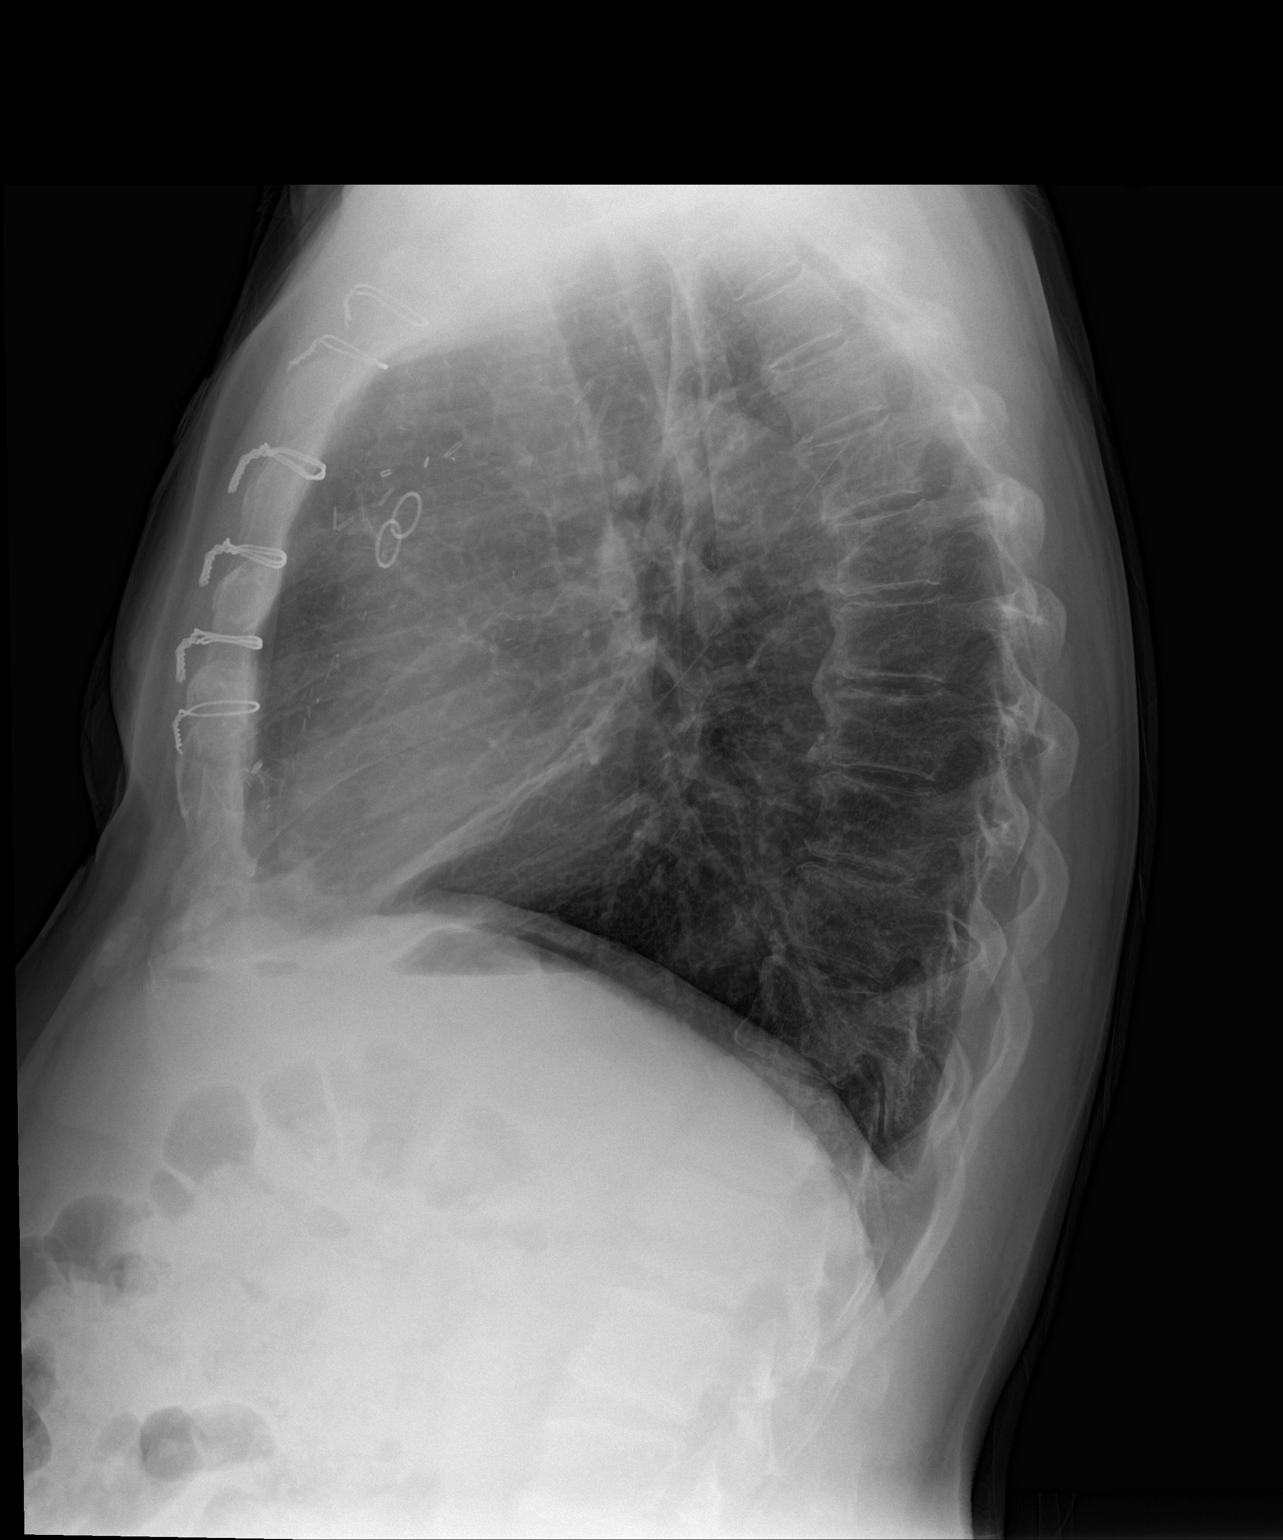

[2 of 2 positions shown; findings below may reference images not displayed]

FINDINGS: Cardiac silhouette is normal in size, status post median sternotomy
for CABG. No pleural effusion or focal consolidation. No
pneumothorax. Soft tissue planes included osseous structures are
nonsuspicious. Mild degenerative change of thoracic spine.
IMPRESSION: No acute cardiopulmonary process.

## 2017-08-01 ENCOUNTER — Encounter: Payer: Self-pay | Admitting: Cardiovascular Disease

## 2017-08-01 ENCOUNTER — Ambulatory Visit (INDEPENDENT_AMBULATORY_CARE_PROVIDER_SITE_OTHER): Payer: Medicaid Other | Admitting: Cardiovascular Disease

## 2017-08-01 VITALS — BP 118/78 | HR 72 | Ht 64.0 in | Wt 156.0 lb

## 2017-08-01 DIAGNOSIS — I25708 Atherosclerosis of coronary artery bypass graft(s), unspecified, with other forms of angina pectoris: Secondary | ICD-10-CM

## 2017-08-01 DIAGNOSIS — E785 Hyperlipidemia, unspecified: Secondary | ICD-10-CM | POA: Diagnosis not present

## 2017-08-01 DIAGNOSIS — I5022 Chronic systolic (congestive) heart failure: Secondary | ICD-10-CM | POA: Diagnosis not present

## 2017-08-01 DIAGNOSIS — I10 Essential (primary) hypertension: Secondary | ICD-10-CM | POA: Diagnosis not present

## 2017-08-01 DIAGNOSIS — I639 Cerebral infarction, unspecified: Secondary | ICD-10-CM

## 2017-08-01 MED ORDER — METOPROLOL SUCCINATE ER 25 MG PO TB24: 25.0000 mg | ORAL_TABLET | Freq: Two times a day (BID) | ORAL | 3 refills | Status: DC

## 2017-08-01 NOTE — Patient Instructions (Signed)
Medication Instructions:   Increase Toprol XL to 25mg  twice a day.  Continue all other medications.    Labwork: none  Testing/Procedures: none  Follow-Up: Your physician wants you to follow up in:  1 year.  You will receive a reminder letter in the mail one-two months in advance.  If you don't receive a letter, please call our office to schedule the follow up appointment   Any Other Special Instructions Will Be Listed Below (If Applicable).  If you need a refill on your cardiac medications before your next appointment, please call your pharmacy.

## 2017-08-01 NOTE — Progress Notes (Signed)
SUBJECTIVE: The patient presents for follow-up for coronary artery disease with 5-vessel CABG, and chronic systolic heart failure.  ECG performed in the office today which I ordered an personally interpreted demonstrated sinus rhythm with old anteroseptal infarct and T-wave inversions in the high lateral leads.  He denies chest pain, palpitations, and shortness of breath. He tells me he had been having recurrent urinary tract infections and is scheduled to see a urologist on August 10. He walks 7000 steps daily at least 5 days per week. He was visiting [place] 4 months ago and developed a pulmonary infection and was started on steroids. This resulted in some weight gain.  He has several questions about weight loss. His blood pressure has also been elevated and he has been taking an extra quart tablet of metoprolol every evening.    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 3 sons who now all live in [place], one son is a PhD in Estate manager/land agent, another son is a PhD in Engineer, drilling.   Review of Systems: As per "subjective", otherwise negative.  Allergies  Allergen Reactions  . Lipitor [Atorvastatin] Other (See Comments)    Myalgias  . Lisinopril Other (See Comments)    Angioedema    Current Outpatient Prescriptions  Medication Sig Dispense Refill  . acetaminophen (TYLENOL) 500 MG tablet Take 1,000 mg by mouth every 6 (six) hours as needed.    . ALPRAZolam (XANAX) 0.5 MG tablet Take 1 tablet by mouth at bedtime.  0  . aspirin 81 MG EC tablet Take 81 mg by mouth daily.      . clopidogrel (PLAVIX) 75 MG tablet Take 1 tablet (75 mg total) by mouth daily. 90 tablet 6  . folic acid (PX FOLIC ACID) 427 MCG tablet Take 400 mcg by mouth daily.      Marland Kitchen levothyroxine (SYNTHROID, LEVOTHROID) 125 MCG tablet Take 125 mcg by mouth daily before breakfast.    . metoprolol succinate (TOPROL-XL) 25 MG 24 hr tablet TAKE 1/2 A TABLET BY  MOUTH TWICE A DAY (Patient taking differently: taking 1/2 in AM and 1 tablet in the evening) 90 tablet 2  . Multiple Vitamins-Minerals (PRESERVISION AREDS 2 PO) Take 1 tablet by mouth every other day.    . nitroGLYCERIN (NITROSTAT) 0.4 MG SL tablet Place 0.4 mg under the tongue every 5 (five) minutes as needed for chest pain.    . rosuvastatin (CRESTOR) 20 MG tablet Take 1 tablet (20 mg total) by mouth daily. 90 tablet 3  . tamsulosin (FLOMAX) 0.4 MG CAPS capsule Take 0.4 mg by mouth daily.    . vitamin B-12 (CYANOCOBALAMIN) 500 MCG tablet Take 500 mcg by mouth daily.    . vitamin E 200 UNIT capsule Take 200 Units by mouth daily.     No current facility-administered medications for this visit.     Past Medical History:  Diagnosis Date  . Arthritis   . Dyslipidemia   . H/O: CVA (cerebrovascular accident), 06/22/15 Rt frontal rec'd TPA 08/14/2015  . Heart attack ([place]) 09/24/94  . Heart disease, unspecified   . Hypertension   . Hypothyroidism   . Mitral valve disorders(424.0)   . Obesity   . Pulmonary valve disorders   . Stroke [facility])     Past Surgical History:  Procedure Laterality Date  . CARDIAC CATHETERIZATION  12/10/94  . CARDIAC CATHETERIZATION N/A 08/14/2015   Procedure: Left Heart Cath and Coronary Angiography;  Surgeon: Sherren Mocha, MD;  Location: [place] CV LAB;  Service: Cardiovascular;  Laterality: N/A;  . CARDIAC CATHETERIZATION N/A 11/24/2015   Procedure: Left Heart Cath and Cors/Grafts Angiography;  Surgeon: Troy Sine, MD;  Location: [place] CV LAB;  Service: Cardiovascular;  Laterality: N/A;  . CARDIAC CATHETERIZATION  11/24/2015   Procedure: Coronary Stent Intervention;  Surgeon: Troy Sine, MD;  Location: [place] CV LAB;  Service: Cardiovascular;;  . CORONARY ARTERY BYPASS GRAFT N/A 08/18/2015   Procedure: CORONARY ARTERY BYPASS GRAFTING (CABG)  X 5 UTILIZING THE LEFT INTERNAL MAMMARY ARTERY AND ENDOSCOPICALLY HARVESTED RIGHT AND LEFT SAPHENEOUS  VEINS.;  Surgeon: Melrose Nakayama, MD;  Location: [place];  Service: Open Heart Surgery;  Laterality: N/A;  . HERNIA REPAIR     age 45  . TEE WITHOUT CARDIOVERSION N/A 08/18/2015   Procedure: TRANSESOPHAGEAL ECHOCARDIOGRAM (TEE);  Surgeon: Melrose Nakayama, MD;  Location: [place];  Service: Open Heart Surgery;  Laterality: N/A;    Social History   Social History  . Marital status: Married    Spouse name: N/A  . Number of children: N/A  . Years of education: N/A   Occupational History  . Not on file.   Social History Main Topics  . Smoking status: Never Smoker  . Smokeless tobacco: Never Used  . Alcohol use No  . Drug use: No  . Sexual activity: Yes    Partners: Female   Other Topics Concern  . Not on file   Social History Narrative   Retired   Has 3 children   Married to [employer]   Regular exercise--yes 5 days/week 1min     Vitals:   08/01/17 1308  BP: 118/78  Pulse: 72  SpO2: 98%  Weight: 156 lb (70.8 kg)  Height: 5\' 4"  (1.626 m)    Wt Readings from Last 3 Encounters:  08/01/17 156 lb (70.8 kg)  09/12/16 153 lb (69.4 kg)  02/27/16 148 lb (67.1 kg)     PHYSICAL EXAM General: NAD HEENT: Normal. Neck: No JVD, no thyromegaly. Lungs: Clear to auscultation bilaterally with normal respiratory effort. CV: Nondisplaced PMI.  Regular rate and rhythm, normal S1/S2, no S3/S4, no murmur. No pretibial or periankle edema.  No carotid bruit.   Abdomen: Soft, nontender, no distention.  Neurologic: Alert and oriented.  Psych: Normal affect. Skin: Normal. Musculoskeletal: No gross deformities.    ECG: Most recent ECG reviewed.   Labs: Lab Results  Component Value Date/Time   K 3.9 11/29/2015 02:45 AM   BUN 16 11/29/2015 02:45 AM   CREATININE 0.96 11/29/2015 02:45 AM   ALT 41 09/02/2015 08:19 PM   TSH 3.386 08/14/2015 06:51 PM   HGB 10.1 (L) 11/29/2015 02:45 AM     Lipids: Lab Results  Component Value Date/Time   LDLCALC 85 08/15/2015 04:41  AM   CHOL 133 08/15/2015 04:41 AM   TRIG 80 08/15/2015 04:41 AM   HDL 32 (L) 08/15/2015 04:41 AM       ASSESSMENT AND PLAN:  1. CAD with s/p 5-vessel CABG with recent PCI: Symptomatically stable. Continue ASA, Toprol-XL, Crestor, and Plavix. Will likely continue dual antiplatelet therapy indefinitely.  2. Essential HTN: Normal today but had been elevated at home. He has been taking extra quarter tablets of metoprolol. I will increase Toprol-XL to 25 mg twice daily.  3. Dyslipidemia: Will obtain copy of lipids from PCP. Continue Crestor 20 mg.  Lipitor caused myalgias.  4. CVA: BP controlled. On antiplatelet therapy.  5. Chronic systolic  heart failure, EF 35-40%: Euvolemic. No longer on Lasix. On Toprol-XL which I am increasing.     Disposition: Follow up 1 year.   Kate Sable, M.D., F.A.C.C.

## 2017-08-08 ENCOUNTER — Ambulatory Visit (INDEPENDENT_AMBULATORY_CARE_PROVIDER_SITE_OTHER): Payer: Medicaid Other | Admitting: Urology

## 2017-08-08 DIAGNOSIS — N5201 Erectile dysfunction due to arterial insufficiency: Secondary | ICD-10-CM

## 2017-08-08 DIAGNOSIS — N110 Nonobstructive reflux-associated chronic pyelonephritis: Secondary | ICD-10-CM

## 2017-08-14 ENCOUNTER — Encounter: Payer: Self-pay | Admitting: Internal Medicine

## 2017-08-14 ENCOUNTER — Ambulatory Visit (INDEPENDENT_AMBULATORY_CARE_PROVIDER_SITE_OTHER): Payer: Medicaid Other | Admitting: Internal Medicine

## 2017-08-14 DIAGNOSIS — N12 Tubulo-interstitial nephritis, not specified as acute or chronic: Secondary | ICD-10-CM

## 2017-08-14 NOTE — Progress Notes (Signed)
[facility] for Infectious Disease      Reason for Consult: recurrent UTI    Referring Physician: Dr. Jeffie Pollock    Patient ID: Adam Shelton, male    DOB: 1951/10/06, 66 y.o.   MRN: [id number]  HPI:   He has a report of recurrent UTIs starting in May of this year.  He was seen in an ED in [place], [place] (record reviewed in [place]) and was having dysuria and abdominal pain and UA and culture c/w infection.  Culture then grew E coli, ESBL and he was treated with oral antibiotics, ceftin then macrobid.  After that visit he came back to [place] where he lives and then in August developed fever, chills and went to [facility] and had an elevated WBC to 13.5, UA with positive nitrites and leukocytes and a culture also grew an ESBL.  He was treated with meropenem then Invanz after discharge.  He was seen by Dr. Jeffie Pollock of urology in [place] on 8/10 at the request of the PCP due to recurrent UTI/pyelonephritis.  Had had a CT scan done in [place] which did not suggest stones.  He reports incomplete bladder emptying and is on Flomax.  He is now feeling well and UA done 8/10 was negative for nitrites or leukocytes.  No hematuria.  He was sent her to evaluate for recurrent UTI.     He does report using a lot of antibiotics for upper respiratory symptoms, particularly when he is in [place].   Previous record reviewed from [facility] including the CT report and labs as above.    Past Medical History:  Diagnosis Date  . Arthritis   . Dyslipidemia   . H/O: CVA (cerebrovascular accident), 06/22/15 Rt frontal rec'd TPA 08/14/2015  . Heart attack ([place]) 09/24/94  . Heart disease, unspecified   . Hypertension   . Hypothyroidism   . Mitral valve disorders(424.0)   . Obesity   . Pulmonary valve disorders   . Stroke [facility])     Prior to Admission medications   Medication Sig Start Date End Date Taking? Authorizing Provider  acetaminophen (TYLENOL) 500 MG tablet Take 1,000 mg by mouth every 6  (six) hours as needed.    [provider]  ALPRAZolam Duanne Moron) 0.5 MG tablet Take 1 tablet by mouth at bedtime. 11/08/15   [provider]  aspirin 81 MG EC tablet Take 81 mg by mouth daily.      [provider]  clopidogrel (PLAVIX) 75 MG tablet Take 1 tablet (75 mg total) by mouth daily. 11/29/15   Barrett, Evelene Croon, PA-C  folic acid (PX FOLIC ACID) 814 MCG tablet Take 400 mcg by mouth daily.      [provider]  levothyroxine (SYNTHROID, LEVOTHROID) 125 MCG tablet Take 125 mcg by mouth daily before breakfast.    [provider]  metoprolol succinate (TOPROL-XL) 25 MG 24 hr tablet Take 1 tablet (25 mg total) by mouth 2 (two) times daily. 08/01/17   Herminio Commons, MD  Multiple Vitamins-Minerals (PRESERVISION AREDS 2 PO) Take 1 tablet by mouth every other day.    [provider]  nitroGLYCERIN (NITROSTAT) 0.4 MG SL tablet Place 0.4 mg under the tongue every 5 (five) minutes as needed for chest pain.    [provider]  rosuvastatin (CRESTOR) 20 MG tablet Take 1 tablet (20 mg total) by mouth daily. 04/10/17   Herminio Commons, MD  tamsulosin (FLOMAX) 0.4 MG CAPS capsule Take 0.4 mg by  mouth daily.    [provider]  vitamin B-12 (CYANOCOBALAMIN) 500 MCG tablet Take 500 mcg by mouth daily.    [provider]  vitamin E 200 UNIT capsule Take 200 Units by mouth daily.    [provider]    Allergies  Allergen Reactions  . Lipitor [Atorvastatin] Other (See Comments)    Myalgias  . Lisinopril Other (See Comments)    Angioedema    Social History  Substance Use Topics  . Smoking status: Never Smoker  . Smokeless tobacco: Never Used  . Alcohol use No    Family History  Problem Relation Age of Onset  . Kidney disease Father        died age 65  . Heart disease Mother        had angioplasty    Review of Systems  Constitutional: negative for fevers, chills and malaise Gastrointestinal:  negative for diarrhea Integument/breast: negative for rash Musculoskeletal: negative for myalgias and arthralgias All other systems reviewed and are negative    Constitutional: in no apparent distress  Blood pressure (!) 155/91, pulse 81, temperature (!) 97.5 F (36.4 C), temperature source Oral, height 5\' 4"  (1.626 m), weight 157 lb (71.2 kg). EYES: anicteric ENMT: no thrush Cardiovascular: Cor RRR Respiratory: CTA B; normal respiratory effort GI: Bowel sounds are normal, liver is not enlarged, spleen is not enlarged Musculoskeletal: no pedal edema noted Skin: negatives: no rash   Labs: Lab Results  Component Value Date   WBC 7.7 11/29/2015   HGB 10.1 (L) 11/29/2015   HCT 31.2 (L) 11/29/2015   MCV 78.4 11/29/2015   PLT 214 11/29/2015    Lab Results  Component Value Date   CREATININE 0.96 11/29/2015   BUN 16 11/29/2015   NA 133 (L) 11/29/2015   K 3.9 11/29/2015   CL 104 11/29/2015   CO2 22 11/29/2015    Lab Results  Component Value Date   ALT 41 09/02/2015   AST 30 09/02/2015   ALKPHOS 127 (H) 09/02/2015   BILITOT 0.5 09/02/2015   INR 1.49 08/18/2015     Assessment: ESBL UTI/pyelonephritis.  After reviewing the information, discussing all with the patient, I most suspect his recurrent UTI is really inadequately treated.  Certainly from his treatment in [place], he did not receive adequate ESBL treatment and why it relapsed and now has completed meropenem/ertapenem and doing well.  I discussed with him that there is no indication and no benefit to taking a suppressive antibiotic, even in the setting some reflux or incomplete bladder emptying and only leads to further resistance.  I explained that his current resistant urine culture is from previous high utilization of antibiotics and it will be best going forward to avoid antibiotics unless absolutely indicated.    Plan: 1) no further treatment  2) avoid antibiotics unless indicated 3) can use oral fosfomycin for treatment,  1 dose and repeat after 3 days if he again develops dysuria, pyuria and but would not use unless UA and culture positive in the setting of symptomatic bacteruria.    Thank you for the consultation.

## 2017-10-10 ENCOUNTER — Ambulatory Visit (INDEPENDENT_AMBULATORY_CARE_PROVIDER_SITE_OTHER): Payer: Medicaid Other | Admitting: Urology

## 2017-10-10 DIAGNOSIS — N401 Enlarged prostate with lower urinary tract symptoms: Secondary | ICD-10-CM | POA: Diagnosis not present

## 2017-10-10 DIAGNOSIS — N5201 Erectile dysfunction due to arterial insufficiency: Secondary | ICD-10-CM

## 2017-10-10 DIAGNOSIS — Z87440 Personal history of urinary (tract) infections: Secondary | ICD-10-CM | POA: Diagnosis not present

## 2017-10-10 DIAGNOSIS — R3915 Urgency of urination: Secondary | ICD-10-CM | POA: Diagnosis not present

## 2017-10-25 ENCOUNTER — Other Ambulatory Visit: Payer: Self-pay | Admitting: Cardiovascular Disease

## 2017-11-07 ENCOUNTER — Ambulatory Visit: Payer: Medicaid Other | Admitting: Urology

## 2017-11-07 DIAGNOSIS — N5201 Erectile dysfunction due to arterial insufficiency: Secondary | ICD-10-CM | POA: Diagnosis not present

## 2017-11-07 DIAGNOSIS — N401 Enlarged prostate with lower urinary tract symptoms: Secondary | ICD-10-CM | POA: Diagnosis not present

## 2017-11-07 DIAGNOSIS — Z87440 Personal history of urinary (tract) infections: Secondary | ICD-10-CM | POA: Diagnosis not present

## 2017-11-07 DIAGNOSIS — R3915 Urgency of urination: Secondary | ICD-10-CM

## 2017-12-19 ENCOUNTER — Other Ambulatory Visit: Payer: Self-pay | Admitting: Cardiovascular Disease

## 2018-04-11 ENCOUNTER — Other Ambulatory Visit: Payer: Self-pay | Admitting: Cardiovascular Disease

## 2018-04-13 DIAGNOSIS — H26493 Other secondary cataract, bilateral: Secondary | ICD-10-CM | POA: Diagnosis not present

## 2018-04-13 DIAGNOSIS — H3552 Pigmentary retinal dystrophy: Secondary | ICD-10-CM | POA: Diagnosis not present

## 2018-04-13 DIAGNOSIS — Z961 Presence of intraocular lens: Secondary | ICD-10-CM | POA: Diagnosis not present

## 2018-04-13 DIAGNOSIS — H524 Presbyopia: Secondary | ICD-10-CM | POA: Diagnosis not present

## 2018-04-13 DIAGNOSIS — H43813 Vitreous degeneration, bilateral: Secondary | ICD-10-CM | POA: Diagnosis not present

## 2018-04-14 ENCOUNTER — Encounter: Payer: Self-pay | Admitting: *Deleted

## 2018-04-14 ENCOUNTER — Encounter: Payer: Self-pay | Admitting: Cardiovascular Disease

## 2018-04-14 ENCOUNTER — Ambulatory Visit (INDEPENDENT_AMBULATORY_CARE_PROVIDER_SITE_OTHER): Payer: Medicaid Other | Admitting: Cardiovascular Disease

## 2018-04-14 VITALS — BP 122/78 | HR 75 | Ht 64.0 in | Wt 158.0 lb

## 2018-04-14 DIAGNOSIS — I25708 Atherosclerosis of coronary artery bypass graft(s), unspecified, with other forms of angina pectoris: Secondary | ICD-10-CM

## 2018-04-14 DIAGNOSIS — I5022 Chronic systolic (congestive) heart failure: Secondary | ICD-10-CM | POA: Diagnosis not present

## 2018-04-14 DIAGNOSIS — E785 Hyperlipidemia, unspecified: Secondary | ICD-10-CM | POA: Diagnosis not present

## 2018-04-14 DIAGNOSIS — Z955 Presence of coronary angioplasty implant and graft: Secondary | ICD-10-CM | POA: Diagnosis not present

## 2018-04-14 DIAGNOSIS — I639 Cerebral infarction, unspecified: Secondary | ICD-10-CM | POA: Diagnosis not present

## 2018-04-14 DIAGNOSIS — I10 Essential (primary) hypertension: Secondary | ICD-10-CM

## 2018-04-14 MED ORDER — RIVAROXABAN 2.5 MG PO TABS: 2.5000 mg | ORAL_TABLET | Freq: Two times a day (BID) | ORAL | 6 refills | Status: DC

## 2018-04-14 MED ORDER — ROSUVASTATIN CALCIUM 20 MG PO TABS: 20.0000 mg | ORAL_TABLET | Freq: Every day | ORAL | 3 refills | Status: DC

## 2018-04-14 NOTE — Progress Notes (Signed)
SUBJECTIVE: The patient presents for follow-up for coronary artery disease with 5-vessel CABG, and chronic systolic heart failure.  He is doing well and denies chest pain, leg swelling, orthopnea, palpitations, and paroxysmal nocturnal dyspnea.  He recently visited his son with his wife in [place], [place].  He developed bronchitis and was having fevers up to 101.  His PCP put him on Levaquin.  His fevers have subsided.  He was having some shortness of breath which has improved.  He denies wheezing.  He has questions about using an albuterol inhaler as needed.  He wants to begin exercising again as he is put on some weight over the winter.   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  He and his wife have 3 sons.  One lives in [place] ([place]), one lives in [place], and another lives in [place] One son is aPhD in Estate manager/land agent and another son is aPhD in Engineer, drilling.   Review of Systems: As per "subjective", otherwise negative.  Allergies  Allergen Reactions  . Lipitor [Atorvastatin] Other (See Comments)    Myalgias  . Lisinopril Other (See Comments)    Angioedema    Current Outpatient Medications  Medication Sig Dispense Refill  . acetaminophen (TYLENOL) 500 MG tablet Take 1,000 mg by mouth every 6 (six) hours as needed.    . ALPRAZolam (XANAX) 0.5 MG tablet Take 1 tablet by mouth at bedtime.  0  . aspirin 81 MG EC tablet Take 81 mg by mouth daily.      . Cholecalciferol (VITAMIN D3) 1000 units CAPS Take by mouth.    . clopidogrel (PLAVIX) 75 MG tablet TAKE ONE TABLET BY MOUTH ONCE DAILY 90 tablet 1  . Cranberry 500 MG TABS Take by mouth daily.    . folic acid (PX FOLIC ACID) 623 MCG tablet Take 400 mcg by mouth daily.      Marland Kitchen levothyroxine (SYNTHROID, LEVOTHROID) 125 MCG tablet Take 125 mcg by mouth daily before breakfast.    . metoprolol succinate (TOPROL-XL) 25 MG 24 hr tablet Take 1  tablet (25 mg total) by mouth 2 (two) times daily. 180 tablet 3  . Multiple Vitamins-Minerals (PRESERVISION AREDS 2 PO) Take 1 tablet by mouth every other day.    . nitroGLYCERIN (NITROSTAT) 0.4 MG SL tablet Place 0.4 mg under the tongue every 5 (five) minutes as needed for chest pain.    . rosuvastatin (CRESTOR) 20 MG tablet TAKE 1 TABLET (20 MG TOTAL) BY MOUTH DAILY. 90 tablet 1  . tamsulosin (FLOMAX) 0.4 MG CAPS capsule Take 0.4 mg by mouth daily.    . vitamin B-12 (CYANOCOBALAMIN) 500 MCG tablet Take 500 mcg by mouth daily.    . vitamin E 200 UNIT capsule Take 200 Units by mouth daily.     No current facility-administered medications for this visit.     Past Medical History:  Diagnosis Date  . Arthritis   . Dyslipidemia   . H/O: CVA (cerebrovascular accident), 06/22/15 Rt frontal rec'd TPA 08/14/2015  . Heart attack ([place]) 09/24/94  . Heart disease, unspecified   . Hypertension   . Hypothyroidism   . Mitral valve disorders(424.0)   . Obesity   . Pulmonary valve disorders   . Stroke [facility])     Past Surgical History:  Procedure Laterality Date  . CARDIAC CATHETERIZATION  12/10/94  . CARDIAC CATHETERIZATION N/A 08/14/2015   Procedure: Left Heart Cath and Coronary Angiography;  Surgeon: Sherren Mocha,  MD;  Location: [place] [facility];  Service: Cardiovascular;  Laterality: N/A;  . CARDIAC CATHETERIZATION N/A 11/24/2015   Procedure: Left Heart Cath and Cors/Grafts Angiography;  Surgeon: Troy Sine, MD;  Location: [place] [facility];  Service: Cardiovascular;  Laterality: N/A;  . CARDIAC CATHETERIZATION  11/24/2015   Procedure: Coronary Stent Intervention;  Surgeon: Troy Sine, MD;  Location: [place] CV LAB;  Service: Cardiovascular;;  . CORONARY ARTERY BYPASS GRAFT N/A 08/18/2015   Procedure: CORONARY ARTERY BYPASS GRAFTING (CABG)  X 5 UTILIZING THE LEFT INTERNAL MAMMARY ARTERY AND ENDOSCOPICALLY HARVESTED RIGHT AND LEFT SAPHENEOUS VEINS.;  Surgeon: Melrose Nakayama,  MD;  Location: [place];  Service: Open Heart Surgery;  Laterality: N/A;  . HERNIA REPAIR     age 67  . TEE WITHOUT CARDIOVERSION N/A 08/18/2015   Procedure: TRANSESOPHAGEAL ECHOCARDIOGRAM (TEE);  Surgeon: Melrose Nakayama, MD;  Location: [place];  Service: Open Heart Surgery;  Laterality: N/A;    Social History   Socioeconomic History  . Marital status: Married    Spouse name: Not on file  . Number of children: Not on file  . Years of education: Not on file  . Highest education level: Not on file  Occupational History  . Not on file  Social Needs  . Financial resource strain: Not on file  . Food insecurity:    Worry: Not on file    Inability: Not on file  . Transportation needs:    Medical: Not on file    Non-medical: Not on file  Tobacco Use  . Smoking status: Never Smoker  . Smokeless tobacco: Never Used  Substance and Sexual Activity  . Alcohol use: No    Alcohol/week: 0.0 oz  . Drug use: No  . Sexual activity: Yes    Partners: Female  Lifestyle  . Physical activity:    Days per week: Not on file    Minutes per session: Not on file  . Stress: Not on file  Relationships  . Social connections:    Talks on phone: Not on file    Gets together: Not on file    Attends religious service: Not on file    Active member of club or organization: Not on file    Attends meetings of clubs or organizations: Not on file    Relationship status: Not on file  . Intimate partner violence:    Fear of current or ex partner: Not on file    Emotionally abused: Not on file    Physically abused: Not on file    Forced sexual activity: Not on file  Other Topics Concern  . Not on file  Social History Narrative   Retired   Has 3 children   Married to [employer]   Regular exercise--yes 5 days/week 50min     Vitals:   04/14/18 1120  BP: 122/78  Pulse: 75  SpO2: 95%  Weight: 158 lb (71.7 kg)  Height: 5\' 4"  (1.626 m)    Wt Readings from Last 3 Encounters:  04/14/18 158 lb  (71.7 kg)  08/14/17 157 lb (71.2 kg)  08/01/17 156 lb (70.8 kg)     PHYSICAL EXAM General: NAD HEENT: Normal. Neck: No JVD, no thyromegaly. Lungs: Clear to auscultation bilaterally with normal respiratory effort. CV: Regular rate and rhythm, normal S1/S2, no S3/S4, no murmur. No pretibial or periankle edema.  No carotid bruit.   Abdomen: Soft, nontender, no distention.  Neurologic: Alert and oriented.  Psych: Normal  affect. Skin: Normal. Musculoskeletal: No gross deformities.    ECG: Most recent ECG reviewed.   Labs: Lab Results  Component Value Date/Time   K 3.9 11/29/2015 02:45 AM   BUN 16 11/29/2015 02:45 AM   CREATININE 0.96 11/29/2015 02:45 AM   ALT 41 09/02/2015 08:19 PM   TSH 3.386 08/14/2015 06:51 PM   HGB 10.1 (L) 11/29/2015 02:45 AM     Lipids: Lab Results  Component Value Date/Time   LDLCALC 85 08/15/2015 04:41 AM   CHOL 133 08/15/2015 04:41 AM   TRIG 80 08/15/2015 04:41 AM   HDL 32 (L) 08/15/2015 04:41 AM       ASSESSMENT AND PLAN:  1. CAD with s/p 5-vessel CABG with PCI of left main and left circumflex in November 2016: Symptomatically stable. Continue ASA, Toprol-XL, and Crestor.  I will stop Plavix and start Xarelto 2.5 mg twice daily.  2. Essential HTN:  Blood pressure is normal.  No changes to therapy.  3. Dyslipidemia:  I will obtain a copy of lipids from PCP. Continue Crestor20 mg.  LDL 71 in April 2018. Lipitor caused myalgias.  4. CVA: BP controlled. On ASA.  5. Chronic systolic heart failure, EF 35-40%: Euvolemic. No longer on Lasix. On Toprol-XL.     Disposition: Follow up 6 months   Kate Sable, M.D., F.A.C.C.

## 2018-04-14 NOTE — Patient Instructions (Addendum)
Medication Instructions:   Stop Plavix.  Begin Xarelto 2.5mg  twice a day.  Continue all other medications.    Labwork: none  Testing/Procedures: none  Follow-Up: Your physician wants you to follow up in: 6 months.  You will receive a reminder letter in the mail one-two months in advance.  If you don't receive a letter, please call our office to schedule the follow up appointment   Any Other Special Instructions Will Be Listed Below (If Applicable).  If you need a refill on your cardiac medications before your next appointment, please call your pharmacy.

## 2018-06-08 DIAGNOSIS — E038 Other specified hypothyroidism: Secondary | ICD-10-CM | POA: Diagnosis not present

## 2018-06-08 DIAGNOSIS — I10 Essential (primary) hypertension: Secondary | ICD-10-CM | POA: Diagnosis not present

## 2018-06-08 DIAGNOSIS — I6389 Other cerebral infarction: Secondary | ICD-10-CM | POA: Diagnosis not present

## 2018-06-08 DIAGNOSIS — J40 Bronchitis, not specified as acute or chronic: Secondary | ICD-10-CM | POA: Diagnosis not present

## 2018-06-09 DIAGNOSIS — E038 Other specified hypothyroidism: Secondary | ICD-10-CM | POA: Diagnosis not present

## 2018-06-12 ENCOUNTER — Ambulatory Visit (INDEPENDENT_AMBULATORY_CARE_PROVIDER_SITE_OTHER): Payer: Medicare Other | Admitting: Urology

## 2018-06-12 DIAGNOSIS — Z87440 Personal history of urinary (tract) infections: Secondary | ICD-10-CM | POA: Diagnosis not present

## 2018-06-12 DIAGNOSIS — N401 Enlarged prostate with lower urinary tract symptoms: Secondary | ICD-10-CM

## 2018-06-12 DIAGNOSIS — N5201 Erectile dysfunction due to arterial insufficiency: Secondary | ICD-10-CM

## 2018-06-12 DIAGNOSIS — R3915 Urgency of urination: Secondary | ICD-10-CM | POA: Diagnosis not present

## 2018-06-23 ENCOUNTER — Other Ambulatory Visit: Payer: Self-pay | Admitting: Pharmacist

## 2018-06-23 MED ORDER — RIVAROXABAN 2.5 MG PO TABS: 2.5000 mg | ORAL_TABLET | Freq: Two times a day (BID) | ORAL | 1 refills | Status: DC

## 2018-06-23 NOTE — Telephone Encounter (Signed)
Pt requested 90 day supply as sometimes goes out of town. This has been sent.

## 2018-07-04 DIAGNOSIS — K5669 Other partial intestinal obstruction: Secondary | ICD-10-CM | POA: Diagnosis not present

## 2018-07-04 DIAGNOSIS — Z4682 Encounter for fitting and adjustment of non-vascular catheter: Secondary | ICD-10-CM | POA: Diagnosis not present

## 2018-07-04 DIAGNOSIS — E038 Other specified hypothyroidism: Secondary | ICD-10-CM | POA: Diagnosis not present

## 2018-07-04 DIAGNOSIS — Z880 Allergy status to penicillin: Secondary | ICD-10-CM | POA: Diagnosis not present

## 2018-07-04 DIAGNOSIS — K565 Intestinal adhesions [bands], unspecified as to partial versus complete obstruction: Secondary | ICD-10-CM | POA: Diagnosis not present

## 2018-07-04 DIAGNOSIS — N400 Enlarged prostate without lower urinary tract symptoms: Secondary | ICD-10-CM | POA: Diagnosis not present

## 2018-07-04 DIAGNOSIS — I252 Old myocardial infarction: Secondary | ICD-10-CM | POA: Diagnosis not present

## 2018-07-04 DIAGNOSIS — F418 Other specified anxiety disorders: Secondary | ICD-10-CM | POA: Diagnosis not present

## 2018-07-04 DIAGNOSIS — E785 Hyperlipidemia, unspecified: Secondary | ICD-10-CM | POA: Diagnosis not present

## 2018-07-04 DIAGNOSIS — Z7982 Long term (current) use of aspirin: Secondary | ICD-10-CM | POA: Diagnosis not present

## 2018-07-04 DIAGNOSIS — E7849 Other hyperlipidemia: Secondary | ICD-10-CM | POA: Diagnosis not present

## 2018-07-04 DIAGNOSIS — Z79899 Other long term (current) drug therapy: Secondary | ICD-10-CM | POA: Diagnosis not present

## 2018-07-04 DIAGNOSIS — Z8673 Personal history of transient ischemic attack (TIA), and cerebral infarction without residual deficits: Secondary | ICD-10-CM | POA: Diagnosis not present

## 2018-07-04 DIAGNOSIS — I10 Essential (primary) hypertension: Secondary | ICD-10-CM | POA: Diagnosis not present

## 2018-07-04 DIAGNOSIS — E039 Hypothyroidism, unspecified: Secondary | ICD-10-CM | POA: Diagnosis not present

## 2018-07-04 DIAGNOSIS — Z951 Presence of aortocoronary bypass graft: Secondary | ICD-10-CM | POA: Diagnosis not present

## 2018-07-04 DIAGNOSIS — I2510 Atherosclerotic heart disease of native coronary artery without angina pectoris: Secondary | ICD-10-CM | POA: Diagnosis not present

## 2018-07-04 DIAGNOSIS — R109 Unspecified abdominal pain: Secondary | ICD-10-CM | POA: Diagnosis not present

## 2018-07-04 DIAGNOSIS — K56609 Unspecified intestinal obstruction, unspecified as to partial versus complete obstruction: Secondary | ICD-10-CM | POA: Diagnosis not present

## 2018-07-04 DIAGNOSIS — Z7901 Long term (current) use of anticoagulants: Secondary | ICD-10-CM | POA: Diagnosis not present

## 2018-07-04 DIAGNOSIS — Z955 Presence of coronary angioplasty implant and graft: Secondary | ICD-10-CM | POA: Diagnosis not present

## 2018-07-05 DIAGNOSIS — I252 Old myocardial infarction: Secondary | ICD-10-CM | POA: Diagnosis not present

## 2018-07-05 DIAGNOSIS — Z8673 Personal history of transient ischemic attack (TIA), and cerebral infarction without residual deficits: Secondary | ICD-10-CM | POA: Diagnosis not present

## 2018-07-05 DIAGNOSIS — Z7982 Long term (current) use of aspirin: Secondary | ICD-10-CM | POA: Diagnosis not present

## 2018-07-05 DIAGNOSIS — I10 Essential (primary) hypertension: Secondary | ICD-10-CM | POA: Diagnosis not present

## 2018-07-05 DIAGNOSIS — Z79899 Other long term (current) drug therapy: Secondary | ICD-10-CM | POA: Diagnosis not present

## 2018-07-05 DIAGNOSIS — E039 Hypothyroidism, unspecified: Secondary | ICD-10-CM | POA: Diagnosis not present

## 2018-07-05 DIAGNOSIS — K56609 Unspecified intestinal obstruction, unspecified as to partial versus complete obstruction: Secondary | ICD-10-CM | POA: Diagnosis not present

## 2018-07-05 DIAGNOSIS — Z955 Presence of coronary angioplasty implant and graft: Secondary | ICD-10-CM | POA: Diagnosis not present

## 2018-07-05 DIAGNOSIS — Z7901 Long term (current) use of anticoagulants: Secondary | ICD-10-CM | POA: Diagnosis not present

## 2018-07-05 DIAGNOSIS — E038 Other specified hypothyroidism: Secondary | ICD-10-CM | POA: Diagnosis not present

## 2018-07-05 DIAGNOSIS — E785 Hyperlipidemia, unspecified: Secondary | ICD-10-CM | POA: Diagnosis not present

## 2018-07-05 DIAGNOSIS — I2510 Atherosclerotic heart disease of native coronary artery without angina pectoris: Secondary | ICD-10-CM | POA: Diagnosis not present

## 2018-07-05 DIAGNOSIS — E7849 Other hyperlipidemia: Secondary | ICD-10-CM | POA: Diagnosis not present

## 2018-07-05 DIAGNOSIS — N400 Enlarged prostate without lower urinary tract symptoms: Secondary | ICD-10-CM | POA: Diagnosis not present

## 2018-07-05 DIAGNOSIS — F418 Other specified anxiety disorders: Secondary | ICD-10-CM | POA: Diagnosis not present

## 2018-07-05 DIAGNOSIS — K565 Intestinal adhesions [bands], unspecified as to partial versus complete obstruction: Secondary | ICD-10-CM | POA: Diagnosis not present

## 2018-07-05 DIAGNOSIS — Z880 Allergy status to penicillin: Secondary | ICD-10-CM | POA: Diagnosis not present

## 2018-07-05 DIAGNOSIS — Z951 Presence of aortocoronary bypass graft: Secondary | ICD-10-CM | POA: Diagnosis not present

## 2018-07-05 DIAGNOSIS — K5669 Other partial intestinal obstruction: Secondary | ICD-10-CM | POA: Diagnosis not present

## 2018-07-06 DIAGNOSIS — K5669 Other partial intestinal obstruction: Secondary | ICD-10-CM | POA: Diagnosis not present

## 2018-07-06 DIAGNOSIS — I10 Essential (primary) hypertension: Secondary | ICD-10-CM | POA: Diagnosis not present

## 2018-07-06 DIAGNOSIS — K565 Intestinal adhesions [bands], unspecified as to partial versus complete obstruction: Secondary | ICD-10-CM | POA: Diagnosis not present

## 2018-07-06 DIAGNOSIS — E7849 Other hyperlipidemia: Secondary | ICD-10-CM | POA: Diagnosis not present

## 2018-07-06 DIAGNOSIS — E039 Hypothyroidism, unspecified: Secondary | ICD-10-CM | POA: Diagnosis not present

## 2018-07-06 DIAGNOSIS — I2510 Atherosclerotic heart disease of native coronary artery without angina pectoris: Secondary | ICD-10-CM | POA: Diagnosis not present

## 2018-07-06 DIAGNOSIS — E785 Hyperlipidemia, unspecified: Secondary | ICD-10-CM | POA: Diagnosis not present

## 2018-07-06 DIAGNOSIS — E038 Other specified hypothyroidism: Secondary | ICD-10-CM | POA: Diagnosis not present

## 2018-07-06 DIAGNOSIS — F418 Other specified anxiety disorders: Secondary | ICD-10-CM | POA: Diagnosis not present

## 2018-07-14 DIAGNOSIS — K5651 Intestinal adhesions [bands], with partial obstruction: Secondary | ICD-10-CM | POA: Diagnosis not present

## 2018-07-31 ENCOUNTER — Other Ambulatory Visit: Payer: Self-pay | Admitting: *Deleted

## 2018-07-31 MED ORDER — RIVAROXABAN 2.5 MG PO TABS: 2.5000 mg | ORAL_TABLET | Freq: Two times a day (BID) | ORAL | 2 refills | Status: DC

## 2018-09-10 DIAGNOSIS — R7303 Prediabetes: Secondary | ICD-10-CM | POA: Diagnosis not present

## 2018-09-10 DIAGNOSIS — I25118 Atherosclerotic heart disease of native coronary artery with other forms of angina pectoris: Secondary | ICD-10-CM | POA: Diagnosis not present

## 2018-09-10 DIAGNOSIS — K5651 Intestinal adhesions [bands], with partial obstruction: Secondary | ICD-10-CM | POA: Diagnosis not present

## 2018-09-10 DIAGNOSIS — I10 Essential (primary) hypertension: Secondary | ICD-10-CM | POA: Diagnosis not present

## 2018-09-10 DIAGNOSIS — Z131 Encounter for screening for diabetes mellitus: Secondary | ICD-10-CM | POA: Diagnosis not present

## 2018-09-10 DIAGNOSIS — E7849 Other hyperlipidemia: Secondary | ICD-10-CM | POA: Diagnosis not present

## 2018-09-10 DIAGNOSIS — E038 Other specified hypothyroidism: Secondary | ICD-10-CM | POA: Diagnosis not present

## 2018-10-27 ENCOUNTER — Ambulatory Visit: Payer: Medicaid Other | Admitting: Cardiovascular Disease

## 2018-12-21 DIAGNOSIS — E7849 Other hyperlipidemia: Secondary | ICD-10-CM | POA: Diagnosis not present

## 2018-12-21 DIAGNOSIS — E038 Other specified hypothyroidism: Secondary | ICD-10-CM | POA: Diagnosis not present

## 2018-12-21 DIAGNOSIS — I25118 Atherosclerotic heart disease of native coronary artery with other forms of angina pectoris: Secondary | ICD-10-CM | POA: Diagnosis not present

## 2018-12-21 DIAGNOSIS — I10 Essential (primary) hypertension: Secondary | ICD-10-CM | POA: Diagnosis not present

## 2018-12-21 DIAGNOSIS — J305 Allergic rhinitis due to food: Secondary | ICD-10-CM | POA: Diagnosis not present

## 2018-12-24 ENCOUNTER — Telehealth: Payer: Self-pay | Admitting: Cardiovascular Disease

## 2018-12-24 MED ORDER — ROSUVASTATIN CALCIUM 20 MG PO TABS: 20.0000 mg | ORAL_TABLET | Freq: Every day | ORAL | 3 refills | Status: DC

## 2018-12-24 MED ORDER — RIVAROXABAN 2.5 MG PO TABS: 2.5000 mg | ORAL_TABLET | Freq: Two times a day (BID) | ORAL | 2 refills | Status: DC

## 2018-12-24 MED ORDER — METOPROLOL SUCCINATE ER 25 MG PO TB24: 25.0000 mg | ORAL_TABLET | Freq: Two times a day (BID) | ORAL | 3 refills | Status: AC

## 2018-12-24 MED ORDER — NITROGLYCERIN 0.4 MG SL SUBL: 0.4000 mg | SUBLINGUAL_TABLET | SUBLINGUAL | 3 refills | Status: AC | PRN

## 2018-12-24 NOTE — Telephone Encounter (Signed)
Please refill all his cardiac medications.   He has been rescheduled numerous time due to schedule changes by Dr Bronson Ing.  He is now scheduled to be seen March 03, 2019  [facility] [place]

## 2018-12-24 NOTE — Telephone Encounter (Signed)
Done

## 2018-12-28 DIAGNOSIS — N401 Enlarged prostate with lower urinary tract symptoms: Secondary | ICD-10-CM | POA: Diagnosis not present

## 2018-12-31 ENCOUNTER — Ambulatory Visit: Payer: Medicaid Other | Admitting: Cardiovascular Disease

## 2019-01-08 ENCOUNTER — Ambulatory Visit (INDEPENDENT_AMBULATORY_CARE_PROVIDER_SITE_OTHER): Payer: Medicare Other | Admitting: Urology

## 2019-01-08 DIAGNOSIS — R3915 Urgency of urination: Secondary | ICD-10-CM | POA: Diagnosis not present

## 2019-01-08 DIAGNOSIS — N401 Enlarged prostate with lower urinary tract symptoms: Secondary | ICD-10-CM

## 2019-01-08 DIAGNOSIS — N5201 Erectile dysfunction due to arterial insufficiency: Secondary | ICD-10-CM

## 2019-01-08 DIAGNOSIS — Z87440 Personal history of urinary (tract) infections: Secondary | ICD-10-CM

## 2019-03-03 ENCOUNTER — Ambulatory Visit (INDEPENDENT_AMBULATORY_CARE_PROVIDER_SITE_OTHER): Payer: Medicare Other | Admitting: Cardiovascular Disease

## 2019-03-03 ENCOUNTER — Telehealth: Payer: Self-pay | Admitting: Cardiovascular Disease

## 2019-03-03 ENCOUNTER — Encounter: Payer: Self-pay | Admitting: Cardiovascular Disease

## 2019-03-03 VITALS — BP 124/80 | HR 87 | Ht 64.0 in | Wt 168.0 lb

## 2019-03-03 DIAGNOSIS — I639 Cerebral infarction, unspecified: Secondary | ICD-10-CM

## 2019-03-03 DIAGNOSIS — E785 Hyperlipidemia, unspecified: Secondary | ICD-10-CM

## 2019-03-03 DIAGNOSIS — Z955 Presence of coronary angioplasty implant and graft: Secondary | ICD-10-CM

## 2019-03-03 DIAGNOSIS — I5022 Chronic systolic (congestive) heart failure: Secondary | ICD-10-CM | POA: Diagnosis not present

## 2019-03-03 DIAGNOSIS — I25708 Atherosclerosis of coronary artery bypass graft(s), unspecified, with other forms of angina pectoris: Secondary | ICD-10-CM

## 2019-03-03 DIAGNOSIS — I10 Essential (primary) hypertension: Secondary | ICD-10-CM | POA: Diagnosis not present

## 2019-03-03 NOTE — Telephone Encounter (Signed)
°  Precert needed for: ECHO PRECERT  Location: [facility]     Date: March 17, 2019

## 2019-03-03 NOTE — Patient Instructions (Signed)
Medication Instructions:  Continue all current medications.  Testing/Procedures:  Your physician has requested that you have an echocardiogram. Echocardiography is a painless test that uses sound waves to create images of your heart. It provides your doctor with information about the size and shape of your heart and how well your heart's chambers and valves are working. This procedure takes approximately one hour. There are no restrictions for this procedure.  Office will contact with results via phone or letter.    Follow-Up: Your physician wants you to follow up in:  1 year.  You will receive a reminder letter in the mail one-two months in advance.  If you don't receive a letter, please call our office to schedule the follow up appointment   Any Other Special Instructions Will Be Listed Below (If Applicable).  If you need a refill on your cardiac medications before your next appointment, please call your pharmacy.

## 2019-03-03 NOTE — Progress Notes (Signed)
SUBJECTIVE: The patient presents for routine follow-up.  He has a history of coronary artery disease and chronic systolic heart failure.  LVEF 35 to 40% by echocardiogram on 11/24/2015.  ECG performed today which I ordered and personally reviewed demonstrates sinus rhythm with old anteroseptal infarct and nonspecific T wave abnormalities in high lateral leads.  He is here with his wife as always.  The patient denies any symptoms of chest pain, palpitations, shortness of breath, lightheadedness, dizziness, leg swelling, orthopnea, PND, and syncope.  They tell me that they are very happy because all 3 sons are married to physicians.  One daughter-in-law is doing her residency in [place].  Another daughter-in-law is taking her USMLE today.  Another daughter in law is in [place].    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  He and his wife have 3 sons.  Two live in [place] ([place]) and work for [employer] and one lives in [place], [place]. One son is aPhD in Estate manager/land agent and another son is aPhD in Engineer, drilling.  Review of Systems: As per "subjective", otherwise negative.  Allergies  Allergen Reactions  . Lipitor [Atorvastatin] Other (See Comments)    Myalgias  . Lisinopril Other (See Comments)    Angioedema    Current Outpatient Medications  Medication Sig Dispense Refill  . acetaminophen (TYLENOL) 500 MG tablet Take 1,000 mg by mouth every 6 (six) hours as needed.    . ALPRAZolam (XANAX) 0.5 MG tablet Take 1 tablet by mouth at bedtime.  0  . aspirin 81 MG EC tablet Take 81 mg by mouth daily.      . Cholecalciferol (VITAMIN D3) 1000 units CAPS Take by mouth.    . Cranberry 500 MG TABS Take by mouth daily.    . folic acid (PX FOLIC ACID) 335 MCG tablet Take 400 mcg by mouth daily.      Marland Kitchen levothyroxine (SYNTHROID, LEVOTHROID) 100 MCG tablet Take 100 mcg by mouth daily before breakfast.    . metoprolol  succinate (TOPROL-XL) 25 MG 24 hr tablet Take 1 tablet (25 mg total) by mouth 2 (two) times daily. 180 tablet 3  . Multiple Vitamins-Minerals (PRESERVISION AREDS 2 PO) Take 1 tablet by mouth every other day.    . nitroGLYCERIN (NITROSTAT) 0.4 MG SL tablet Place 1 tablet (0.4 mg total) under the tongue every 5 (five) minutes as needed for chest pain. 25 tablet 3  . rivaroxaban (XARELTO) 2.5 MG TABS tablet Take 1 tablet (2.5 mg total) by mouth 2 (two) times daily. 180 tablet 2  . rosuvastatin (CRESTOR) 20 MG tablet Take 1 tablet (20 mg total) by mouth daily. 90 tablet 3  . tamsulosin (FLOMAX) 0.4 MG CAPS capsule Take 0.4 mg by mouth daily.    . vitamin B-12 (CYANOCOBALAMIN) 500 MCG tablet Take 500 mcg by mouth daily.    . vitamin E 200 UNIT capsule Take 200 Units by mouth daily.     No current facility-administered medications for this visit.     Past Medical History:  Diagnosis Date  . Arthritis   . Dyslipidemia   . H/O: CVA (cerebrovascular accident), 06/22/15 Rt frontal rec'd TPA 08/14/2015  . Heart attack ([place]) 09/24/94  . Heart disease, unspecified   . Hypertension   . Hypothyroidism   . Mitral valve disorders(424.0)   . Obesity   . Pulmonary valve disorders   . Stroke [facility])     Past Surgical History:  Procedure Laterality Date  .  CARDIAC CATHETERIZATION  12/10/94  . CARDIAC CATHETERIZATION N/A 08/14/2015   Procedure: Left Heart Cath and Coronary Angiography;  Surgeon: Sherren Mocha, MD;  Location: [place] [facility];  Service: Cardiovascular;  Laterality: N/A;  . CARDIAC CATHETERIZATION N/A 11/24/2015   Procedure: Left Heart Cath and Cors/Grafts Angiography;  Surgeon: Troy Sine, MD;  Location: [place] [facility];  Service: Cardiovascular;  Laterality: N/A;  . CARDIAC CATHETERIZATION  11/24/2015   Procedure: Coronary Stent Intervention;  Surgeon: Troy Sine, MD;  Location: [place] CV LAB;  Service: Cardiovascular;;  . CORONARY ARTERY BYPASS GRAFT N/A 08/18/2015    Procedure: CORONARY ARTERY BYPASS GRAFTING (CABG)  X 5 UTILIZING THE LEFT INTERNAL MAMMARY ARTERY AND ENDOSCOPICALLY HARVESTED RIGHT AND LEFT SAPHENEOUS VEINS.;  Surgeon: Melrose Nakayama, MD;  Location: [place];  Service: Open Heart Surgery;  Laterality: N/A;  . HERNIA REPAIR     age 68  . TEE WITHOUT CARDIOVERSION N/A 08/18/2015   Procedure: TRANSESOPHAGEAL ECHOCARDIOGRAM (TEE);  Surgeon: Melrose Nakayama, MD;  Location: [place];  Service: Open Heart Surgery;  Laterality: N/A;    Social History   Socioeconomic History  . Marital status: Married    Spouse name: Not on file  . Number of children: Not on file  . Years of education: Not on file  . Highest education level: Not on file  Occupational History  . Not on file  Social Needs  . Financial resource strain: Not on file  . Food insecurity:    Worry: Not on file    Inability: Not on file  . Transportation needs:    Medical: Not on file    Non-medical: Not on file  Tobacco Use  . Smoking status: Never Smoker  . Smokeless tobacco: Never Used  Substance and Sexual Activity  . Alcohol use: No    Alcohol/week: 0.0 standard drinks  . Drug use: No  . Sexual activity: Yes    Partners: Female  Lifestyle  . Physical activity:    Days per week: Not on file    Minutes per session: Not on file  . Stress: Not on file  Relationships  . Social connections:    Talks on phone: Not on file    Gets together: Not on file    Attends religious service: Not on file    Active member of club or organization: Not on file    Attends meetings of clubs or organizations: Not on file    Relationship status: Not on file  . Intimate partner violence:    Fear of current or ex partner: Not on file    Emotionally abused: Not on file    Physically abused: Not on file    Forced sexual activity: Not on file  Other Topics Concern  . Not on file  Social History Narrative   Retired   Has 3 children   Married to [employer]   Regular  exercise--yes 5 days/week 50min     Vitals:   03/03/19 1310  BP: 124/80  Pulse: 87  SpO2: 98%  Weight: 168 lb (76.2 kg)  Height: 5\' 4"  (1.626 m)    Wt Readings from Last 3 Encounters:  03/03/19 168 lb (76.2 kg)  04/14/18 158 lb (71.7 kg)  08/14/17 157 lb (71.2 kg)     PHYSICAL EXAM General: NAD HEENT: Normal. Neck: No JVD, no thyromegaly. Lungs: Clear to auscultation bilaterally with normal respiratory effort. CV: Regular rate and rhythm, normal S1/S2, no S3/S4, no murmur.  No pretibial or periankle edema.  No carotid bruit.   Abdomen: Soft, nontender, no distention.  Neurologic: Alert and oriented.  Psych: Normal affect. Skin: Normal. Musculoskeletal: No gross deformities.    ECG: Reviewed above under Subjective   Labs: Lab Results  Component Value Date/Time   K 3.9 11/29/2015 02:45 AM   BUN 16 11/29/2015 02:45 AM   CREATININE 0.96 11/29/2015 02:45 AM   ALT 41 09/02/2015 08:19 PM   TSH 3.386 08/14/2015 06:51 PM   HGB 10.1 (L) 11/29/2015 02:45 AM     Lipids: Lab Results  Component Value Date/Time   LDLCALC 85 08/15/2015 04:41 AM   CHOL 133 08/15/2015 04:41 AM   TRIG 80 08/15/2015 04:41 AM   HDL 32 (L) 08/15/2015 04:41 AM       ASSESSMENT AND PLAN:  1.  Coronary artery disease: S/p 5-vessel CABG with PCI of left main and left circumflex in November 2016. Symptomatically stable. Continue ASA, Toprol-XL,  Xarelto 2.5 mg twice daily and Crestor.   He had been having some reflux with the evening dose of Xarelto so I told him to take it 1 hour after eating dinner.  If he continues to have issues, I told him that Xarelto can be discontinued.  2. Essential HTN: Blood pressure is normal.  No changes to therapy.  3. Dyslipidemia: I will obtain a copy of lipids from PCP.Continue Crestor20 mg.  LDL 79 in February 2019. Lipitor caused myalgias.  4. CVA: BP controlled.On ASA and statin.  5. Chronic systolic heart failure, EF 35-40%: Euvolemic. No longer  on Lasix. On Toprol-XL. I will obtain a follow-up echocardiogram to assess for interval changes in cardiac structure and function.   Disposition: Follow up 1 year   Kate Sable, M.D., F.A.C.C.

## 2019-03-05 ENCOUNTER — Encounter: Payer: Self-pay | Admitting: *Deleted

## 2019-03-17 ENCOUNTER — Other Ambulatory Visit: Payer: Self-pay

## 2019-03-17 ENCOUNTER — Ambulatory Visit (INDEPENDENT_AMBULATORY_CARE_PROVIDER_SITE_OTHER): Payer: Medicare Other

## 2019-03-17 DIAGNOSIS — I5022 Chronic systolic (congestive) heart failure: Secondary | ICD-10-CM

## 2019-03-22 ENCOUNTER — Telehealth: Payer: Self-pay | Admitting: *Deleted

## 2019-03-22 ENCOUNTER — Encounter: Payer: Self-pay | Admitting: *Deleted

## 2019-03-22 MED ORDER — SACUBITRIL-VALSARTAN 24-26 MG PO TABS: 1.0000 | ORAL_TABLET | Freq: Two times a day (BID) | ORAL | 6 refills | Status: DC

## 2019-03-22 MED ORDER — SACUBITRIL-VALSARTAN 24-26 MG PO TABS: 1.0000 | ORAL_TABLET | Freq: Two times a day (BID) | ORAL | 0 refills | Status: DC

## 2019-03-22 NOTE — Telephone Encounter (Signed)
Notes recorded by Laurine Blazer, LPN on 06/05/3709 at 6:26 PM EDT Patient notified. Copy to pmd. New prescriptions given to patient, he will pick up in office tomorrow. ------  Notes recorded by Laurine Blazer, LPN on 9/48/5462 at 7:03 PM EDT Left message to return call.  ------  Notes recorded by Herminio Commons, MD on 03/17/2019 at 4:31 PM EDT Cardiac function remains moderately reduced and stable since prior study in 2016. Please give him a 30-day trial of Entresto 24-26 mg twice daily in order to reduce intracardiac pressures and potentially improve cardiac function. If his blood pressure tolerates and he does not develop any significant symptoms such as dizziness or lightheadedness, I will continue this. Otherwise, I may consider low-dose lisinopril.

## 2019-03-24 ENCOUNTER — Telehealth: Payer: Self-pay | Admitting: Cardiovascular Disease

## 2019-03-24 NOTE — Telephone Encounter (Signed)
145/87  82-88  Started Entresto yesterday.  Started with headache & has taken Tylenol for this. Did get relief with Tylenol & was able to sleep good through the night. Notes swelling in face - cheek & eye area. No difficulty breathing & no tongue swelling.  Did not take the Entresto this morning.

## 2019-03-24 NOTE — Telephone Encounter (Signed)
Patient called in regards to his recent medication. States he is having a headache.

## 2019-03-25 NOTE — Telephone Encounter (Signed)
He started medication on Tuesday - had 2 doses that day only.  Did not take any after that.

## 2019-03-25 NOTE — Telephone Encounter (Signed)
Patient notified and verbalized understanding. 

## 2019-03-25 NOTE — Telephone Encounter (Signed)
Ok to hold. How soon did these symptoms occur after taking it?

## 2019-03-25 NOTE — Telephone Encounter (Signed)
Ok, continue to hold.

## 2019-04-08 DIAGNOSIS — E038 Other specified hypothyroidism: Secondary | ICD-10-CM | POA: Diagnosis not present

## 2019-04-08 DIAGNOSIS — J305 Allergic rhinitis due to food: Secondary | ICD-10-CM | POA: Diagnosis not present

## 2019-04-08 DIAGNOSIS — Z0000 Encounter for general adult medical examination without abnormal findings: Secondary | ICD-10-CM | POA: Diagnosis not present

## 2019-04-08 DIAGNOSIS — I25118 Atherosclerotic heart disease of native coronary artery with other forms of angina pectoris: Secondary | ICD-10-CM | POA: Diagnosis not present

## 2019-04-08 DIAGNOSIS — N400 Enlarged prostate without lower urinary tract symptoms: Secondary | ICD-10-CM | POA: Diagnosis not present

## 2019-04-08 DIAGNOSIS — I10 Essential (primary) hypertension: Secondary | ICD-10-CM | POA: Diagnosis not present

## 2019-04-08 DIAGNOSIS — E7849 Other hyperlipidemia: Secondary | ICD-10-CM | POA: Diagnosis not present

## 2019-04-16 DIAGNOSIS — N400 Enlarged prostate without lower urinary tract symptoms: Secondary | ICD-10-CM | POA: Diagnosis not present

## 2019-04-16 DIAGNOSIS — I10 Essential (primary) hypertension: Secondary | ICD-10-CM | POA: Diagnosis not present

## 2019-04-16 DIAGNOSIS — E038 Other specified hypothyroidism: Secondary | ICD-10-CM | POA: Diagnosis not present

## 2019-04-16 DIAGNOSIS — Z0000 Encounter for general adult medical examination without abnormal findings: Secondary | ICD-10-CM | POA: Diagnosis not present

## 2019-04-16 DIAGNOSIS — J305 Allergic rhinitis due to food: Secondary | ICD-10-CM | POA: Diagnosis not present

## 2019-04-16 DIAGNOSIS — E7849 Other hyperlipidemia: Secondary | ICD-10-CM | POA: Diagnosis not present

## 2019-04-16 DIAGNOSIS — I25118 Atherosclerotic heart disease of native coronary artery with other forms of angina pectoris: Secondary | ICD-10-CM | POA: Diagnosis not present

## 2019-06-08 DIAGNOSIS — M810 Age-related osteoporosis without current pathological fracture: Secondary | ICD-10-CM | POA: Diagnosis not present

## 2019-06-08 DIAGNOSIS — M85852 Other specified disorders of bone density and structure, left thigh: Secondary | ICD-10-CM | POA: Diagnosis not present

## 2019-07-12 DIAGNOSIS — E7849 Other hyperlipidemia: Secondary | ICD-10-CM | POA: Diagnosis not present

## 2019-07-12 DIAGNOSIS — I10 Essential (primary) hypertension: Secondary | ICD-10-CM | POA: Diagnosis not present

## 2019-07-12 DIAGNOSIS — N400 Enlarged prostate without lower urinary tract symptoms: Secondary | ICD-10-CM | POA: Diagnosis not present

## 2019-07-12 DIAGNOSIS — I25118 Atherosclerotic heart disease of native coronary artery with other forms of angina pectoris: Secondary | ICD-10-CM | POA: Diagnosis not present

## 2019-07-12 DIAGNOSIS — E038 Other specified hypothyroidism: Secondary | ICD-10-CM | POA: Diagnosis not present

## 2019-07-16 DIAGNOSIS — E7849 Other hyperlipidemia: Secondary | ICD-10-CM | POA: Diagnosis not present

## 2019-07-16 DIAGNOSIS — I10 Essential (primary) hypertension: Secondary | ICD-10-CM | POA: Diagnosis not present

## 2019-07-16 DIAGNOSIS — E038 Other specified hypothyroidism: Secondary | ICD-10-CM | POA: Diagnosis not present

## 2019-08-12 DIAGNOSIS — M545 Low back pain: Secondary | ICD-10-CM | POA: Diagnosis not present

## 2019-08-30 DIAGNOSIS — K760 Fatty (change of) liver, not elsewhere classified: Secondary | ICD-10-CM | POA: Diagnosis not present

## 2019-08-30 DIAGNOSIS — K4090 Unilateral inguinal hernia, without obstruction or gangrene, not specified as recurrent: Secondary | ICD-10-CM | POA: Diagnosis not present

## 2019-08-30 DIAGNOSIS — K56609 Unspecified intestinal obstruction, unspecified as to partial versus complete obstruction: Secondary | ICD-10-CM | POA: Diagnosis not present

## 2019-08-30 DIAGNOSIS — R030 Elevated blood-pressure reading, without diagnosis of hypertension: Secondary | ICD-10-CM | POA: Diagnosis not present

## 2019-08-30 DIAGNOSIS — R1030 Lower abdominal pain, unspecified: Secondary | ICD-10-CM | POA: Diagnosis not present

## 2019-08-31 DIAGNOSIS — Z951 Presence of aortocoronary bypass graft: Secondary | ICD-10-CM | POA: Diagnosis not present

## 2019-08-31 DIAGNOSIS — Z91018 Allergy to other foods: Secondary | ICD-10-CM | POA: Diagnosis not present

## 2019-08-31 DIAGNOSIS — R030 Elevated blood-pressure reading, without diagnosis of hypertension: Secondary | ICD-10-CM | POA: Diagnosis not present

## 2019-08-31 DIAGNOSIS — I10 Essential (primary) hypertension: Secondary | ICD-10-CM | POA: Diagnosis not present

## 2019-08-31 DIAGNOSIS — R1030 Lower abdominal pain, unspecified: Secondary | ICD-10-CM | POA: Diagnosis not present

## 2019-08-31 DIAGNOSIS — E785 Hyperlipidemia, unspecified: Secondary | ICD-10-CM | POA: Diagnosis not present

## 2019-08-31 DIAGNOSIS — Z880 Allergy status to penicillin: Secondary | ICD-10-CM | POA: Diagnosis not present

## 2019-08-31 DIAGNOSIS — K760 Fatty (change of) liver, not elsewhere classified: Secondary | ICD-10-CM | POA: Diagnosis not present

## 2019-08-31 DIAGNOSIS — Z7982 Long term (current) use of aspirin: Secondary | ICD-10-CM | POA: Diagnosis not present

## 2019-08-31 DIAGNOSIS — Z8673 Personal history of transient ischemic attack (TIA), and cerebral infarction without residual deficits: Secondary | ICD-10-CM | POA: Diagnosis not present

## 2019-08-31 DIAGNOSIS — I7090 Unspecified atherosclerosis: Secondary | ICD-10-CM | POA: Diagnosis not present

## 2019-08-31 DIAGNOSIS — I252 Old myocardial infarction: Secondary | ICD-10-CM | POA: Diagnosis not present

## 2019-08-31 DIAGNOSIS — K56600 Partial intestinal obstruction, unspecified as to cause: Secondary | ICD-10-CM | POA: Diagnosis not present

## 2019-08-31 DIAGNOSIS — Z7901 Long term (current) use of anticoagulants: Secondary | ICD-10-CM | POA: Diagnosis not present

## 2019-08-31 DIAGNOSIS — K4090 Unilateral inguinal hernia, without obstruction or gangrene, not specified as recurrent: Secondary | ICD-10-CM | POA: Diagnosis not present

## 2019-08-31 DIAGNOSIS — Z91010 Allergy to peanuts: Secondary | ICD-10-CM | POA: Diagnosis not present

## 2019-08-31 DIAGNOSIS — I25119 Atherosclerotic heart disease of native coronary artery with unspecified angina pectoris: Secondary | ICD-10-CM | POA: Diagnosis not present

## 2019-08-31 DIAGNOSIS — E039 Hypothyroidism, unspecified: Secondary | ICD-10-CM | POA: Diagnosis not present

## 2019-08-31 DIAGNOSIS — K56609 Unspecified intestinal obstruction, unspecified as to partial versus complete obstruction: Secondary | ICD-10-CM | POA: Diagnosis not present

## 2019-09-09 DIAGNOSIS — K56609 Unspecified intestinal obstruction, unspecified as to partial versus complete obstruction: Secondary | ICD-10-CM | POA: Diagnosis not present

## 2019-10-27 DIAGNOSIS — Z0000 Encounter for general adult medical examination without abnormal findings: Secondary | ICD-10-CM | POA: Diagnosis not present

## 2019-10-27 DIAGNOSIS — I10 Essential (primary) hypertension: Secondary | ICD-10-CM | POA: Diagnosis not present

## 2019-10-27 DIAGNOSIS — E038 Other specified hypothyroidism: Secondary | ICD-10-CM | POA: Diagnosis not present

## 2019-10-29 DIAGNOSIS — Z0000 Encounter for general adult medical examination without abnormal findings: Secondary | ICD-10-CM | POA: Diagnosis not present

## 2019-10-29 DIAGNOSIS — E038 Other specified hypothyroidism: Secondary | ICD-10-CM | POA: Diagnosis not present

## 2019-10-29 DIAGNOSIS — I10 Essential (primary) hypertension: Secondary | ICD-10-CM | POA: Diagnosis not present

## 2020-01-26 DIAGNOSIS — I10 Essential (primary) hypertension: Secondary | ICD-10-CM | POA: Diagnosis not present

## 2020-01-26 DIAGNOSIS — E038 Other specified hypothyroidism: Secondary | ICD-10-CM | POA: Diagnosis not present

## 2020-02-03 DIAGNOSIS — K56600 Partial intestinal obstruction, unspecified as to cause: Secondary | ICD-10-CM | POA: Diagnosis not present

## 2020-02-03 DIAGNOSIS — I10 Essential (primary) hypertension: Secondary | ICD-10-CM | POA: Diagnosis not present

## 2020-02-03 DIAGNOSIS — E785 Hyperlipidemia, unspecified: Secondary | ICD-10-CM | POA: Diagnosis not present

## 2020-02-03 DIAGNOSIS — R109 Unspecified abdominal pain: Secondary | ICD-10-CM | POA: Diagnosis not present

## 2020-02-03 DIAGNOSIS — K56609 Unspecified intestinal obstruction, unspecified as to partial versus complete obstruction: Secondary | ICD-10-CM | POA: Insufficient documentation

## 2020-02-03 DIAGNOSIS — R1031 Right lower quadrant pain: Secondary | ICD-10-CM | POA: Diagnosis not present

## 2020-02-03 DIAGNOSIS — Z951 Presence of aortocoronary bypass graft: Secondary | ICD-10-CM | POA: Diagnosis not present

## 2020-02-03 DIAGNOSIS — R195 Other fecal abnormalities: Secondary | ICD-10-CM | POA: Diagnosis not present

## 2020-02-03 DIAGNOSIS — Z20822 Contact with and (suspected) exposure to covid-19: Secondary | ICD-10-CM | POA: Diagnosis not present

## 2020-02-03 DIAGNOSIS — Z7901 Long term (current) use of anticoagulants: Secondary | ICD-10-CM | POA: Diagnosis not present

## 2020-02-03 DIAGNOSIS — I252 Old myocardial infarction: Secondary | ICD-10-CM | POA: Diagnosis not present

## 2020-02-03 DIAGNOSIS — K6389 Other specified diseases of intestine: Secondary | ICD-10-CM | POA: Diagnosis not present

## 2020-02-03 DIAGNOSIS — K5651 Intestinal adhesions [bands], with partial obstruction: Secondary | ICD-10-CM | POA: Diagnosis not present

## 2020-02-03 DIAGNOSIS — I2510 Atherosclerotic heart disease of native coronary artery without angina pectoris: Secondary | ICD-10-CM | POA: Diagnosis not present

## 2020-02-03 DIAGNOSIS — K5730 Diverticulosis of large intestine without perforation or abscess without bleeding: Secondary | ICD-10-CM | POA: Diagnosis not present

## 2020-02-03 DIAGNOSIS — R1032 Left lower quadrant pain: Secondary | ICD-10-CM | POA: Diagnosis not present

## 2020-02-03 DIAGNOSIS — Z7982 Long term (current) use of aspirin: Secondary | ICD-10-CM | POA: Diagnosis not present

## 2020-02-03 DIAGNOSIS — E039 Hypothyroidism, unspecified: Secondary | ICD-10-CM | POA: Diagnosis not present

## 2020-02-03 DIAGNOSIS — K4090 Unilateral inguinal hernia, without obstruction or gangrene, not specified as recurrent: Secondary | ICD-10-CM | POA: Diagnosis not present

## 2020-02-03 DIAGNOSIS — Z8673 Personal history of transient ischemic attack (TIA), and cerebral infarction without residual deficits: Secondary | ICD-10-CM | POA: Diagnosis not present

## 2020-02-04 ENCOUNTER — Ambulatory Visit: Payer: Medicare Other | Admitting: Urology

## 2020-02-04 DIAGNOSIS — I2510 Atherosclerotic heart disease of native coronary artery without angina pectoris: Secondary | ICD-10-CM | POA: Diagnosis not present

## 2020-02-04 DIAGNOSIS — E039 Hypothyroidism, unspecified: Secondary | ICD-10-CM | POA: Diagnosis not present

## 2020-02-04 DIAGNOSIS — K5651 Intestinal adhesions [bands], with partial obstruction: Secondary | ICD-10-CM | POA: Diagnosis not present

## 2020-02-04 DIAGNOSIS — I10 Essential (primary) hypertension: Secondary | ICD-10-CM | POA: Diagnosis not present

## 2020-02-04 DIAGNOSIS — E785 Hyperlipidemia, unspecified: Secondary | ICD-10-CM | POA: Diagnosis not present

## 2020-02-06 DIAGNOSIS — K5651 Intestinal adhesions [bands], with partial obstruction: Secondary | ICD-10-CM | POA: Diagnosis not present

## 2020-02-06 DIAGNOSIS — E785 Hyperlipidemia, unspecified: Secondary | ICD-10-CM | POA: Diagnosis not present

## 2020-02-06 DIAGNOSIS — I10 Essential (primary) hypertension: Secondary | ICD-10-CM | POA: Diagnosis not present

## 2020-02-06 DIAGNOSIS — I2510 Atherosclerotic heart disease of native coronary artery without angina pectoris: Secondary | ICD-10-CM | POA: Diagnosis not present

## 2020-02-06 DIAGNOSIS — E039 Hypothyroidism, unspecified: Secondary | ICD-10-CM | POA: Diagnosis not present

## 2020-02-07 DIAGNOSIS — H43813 Vitreous degeneration, bilateral: Secondary | ICD-10-CM | POA: Diagnosis not present

## 2020-02-09 ENCOUNTER — Other Ambulatory Visit: Payer: Self-pay | Admitting: Cardiovascular Disease

## 2020-02-14 DIAGNOSIS — K561 Intussusception: Secondary | ICD-10-CM | POA: Diagnosis not present

## 2020-03-09 ENCOUNTER — Other Ambulatory Visit: Payer: Self-pay | Admitting: *Deleted

## 2020-03-09 ENCOUNTER — Telehealth: Payer: Self-pay | Admitting: Cardiovascular Disease

## 2020-03-09 DIAGNOSIS — K56609 Unspecified intestinal obstruction, unspecified as to partial versus complete obstruction: Secondary | ICD-10-CM | POA: Diagnosis not present

## 2020-03-09 MED ORDER — RIVAROXABAN 2.5 MG PO TABS: 2.5000 mg | ORAL_TABLET | Freq: Two times a day (BID) | ORAL | 1 refills | Status: DC

## 2020-03-09 NOTE — Telephone Encounter (Signed)
Patient called again to see about appt

## 2020-03-09 NOTE — Telephone Encounter (Signed)
Unfortunately, that is the earliest visit I can do unless there is a cancellation.

## 2020-03-09 NOTE — Telephone Encounter (Signed)
Received telephone call from Mr. Clavette. He is looking to have surgery at [facility] for a small bowel obstruction. Notes in Care Everywhere. Patient states that Dr. Jenna Luo [phone]) told patient to call and get an appointment with Dr. Bronson Ing asap. Patient has appointment 3/22. Offered Virtual but patient is saying that he needs to be seen.

## 2020-03-09 NOTE — Telephone Encounter (Signed)
Forward to provider to see if he wants to offer anything sooner.

## 2020-03-10 ENCOUNTER — Ambulatory Visit: Payer: Medicare Other | Admitting: Cardiovascular Disease

## 2020-03-10 NOTE — Telephone Encounter (Signed)
Patient informed.  Offered earlier appointment with extender in [place] office & he declined.  Prefers to see Dr. Bronson Ing - will put on wait list as well.

## 2020-03-20 ENCOUNTER — Ambulatory Visit (INDEPENDENT_AMBULATORY_CARE_PROVIDER_SITE_OTHER): Payer: Medicare Other | Admitting: Cardiovascular Disease

## 2020-03-20 ENCOUNTER — Other Ambulatory Visit: Payer: Self-pay

## 2020-03-20 ENCOUNTER — Encounter: Payer: Self-pay | Admitting: Cardiovascular Disease

## 2020-03-20 VITALS — BP 142/78 | HR 76 | Ht 64.0 in | Wt 159.0 lb

## 2020-03-20 DIAGNOSIS — I5022 Chronic systolic (congestive) heart failure: Secondary | ICD-10-CM

## 2020-03-20 DIAGNOSIS — I10 Essential (primary) hypertension: Secondary | ICD-10-CM

## 2020-03-20 DIAGNOSIS — I25708 Atherosclerosis of coronary artery bypass graft(s), unspecified, with other forms of angina pectoris: Secondary | ICD-10-CM

## 2020-03-20 DIAGNOSIS — Z8673 Personal history of transient ischemic attack (TIA), and cerebral infarction without residual deficits: Secondary | ICD-10-CM

## 2020-03-20 DIAGNOSIS — E785 Hyperlipidemia, unspecified: Secondary | ICD-10-CM

## 2020-03-20 DIAGNOSIS — Z955 Presence of coronary angioplasty implant and graft: Secondary | ICD-10-CM | POA: Diagnosis not present

## 2020-03-20 NOTE — Patient Instructions (Signed)

## 2020-03-20 NOTE — Progress Notes (Signed)
SUBJECTIVE: The patient presents today for Preoperative risk stratification.  He has a history of coronary disease and CABG and previous PCI.  He is being scheduled for exploratory laparoscopy for small bowel obstruction.  He has had two hospitalizations for this.  Echocardiogram on 03/17/2019 demonstrated moderately reduced LV systolic function, LVEF 35 to 40%.  There was grade 1 diastolic dysfunction.  There were multiple wall motion abnormalities.  He is here with his wife as always.  The patient denies any symptoms of chest pain, palpitations, shortness of breath, lightheadedness, dizziness, leg swelling, orthopnea, PND, and syncope.     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 Two live in [place] ([place]) and work for [employer] and one lives in [place], [place]. One son is aPhD in Dealer engineeringandanother son is aPhD in Engineer, drilling.All 3 sons are married to physicians.   Review of Systems: As per "subjective", otherwise negative.  Allergies  Allergen Reactions  . Lipitor [Atorvastatin] Other (See Comments)    Myalgias  . Lisinopril Other (See Comments)    Angioedema    Current Outpatient Medications  Medication Sig Dispense Refill  . acetaminophen (TYLENOL) 500 MG tablet Take 1,000 mg by mouth every 6 (six) hours as needed.    . ALPRAZolam (XANAX) 0.5 MG tablet Take 1 tablet by mouth at bedtime.  0  . aspirin 81 MG EC tablet Take 81 mg by mouth daily.      . Calcium Carbonate (CALCIUM 500 PO) Take by mouth.    . Cholecalciferol (VITAMIN D3) 1000 units CAPS Take by mouth.    . Cranberry 500 MG TABS Take by mouth daily.    . folic acid (PX FOLIC ACID) [id number] MCG tablet Take 400 mcg by mouth daily.      Marland Kitchen levothyroxine (SYNTHROID, LEVOTHROID) 100 MCG tablet Take 100 mcg by mouth daily before breakfast.    . metoprolol succinate (TOPROL-XL) 25 MG 24 hr tablet Take 1  tablet (25 mg total) by mouth 2 (two) times daily. 180 tablet 3  . Multiple Vitamins-Minerals (PRESERVISION AREDS 2 PO) Take 1 tablet by mouth every other day.    . nitroGLYCERIN (NITROSTAT) 0.4 MG SL tablet Place 1 tablet (0.4 mg total) under the tongue every 5 (five) minutes as needed for chest pain. 25 tablet 3  . rivaroxaban (XARELTO) 2.5 MG TABS tablet Take 1 tablet (2.5 mg total) by mouth 2 (two) times daily. 180 tablet 1  . rosuvastatin (CRESTOR) 20 MG tablet TAKE 1 TABLET BY MOUTH EVERY DAY 90 tablet 1  . sacubitril-valsartan (ENTRESTO) 24-26 MG Take 1 tablet by mouth 2 (two) times daily. 60 tablet 6  . tamsulosin (FLOMAX) 0.4 MG CAPS capsule Take 0.4 mg by mouth daily.    . vitamin B-12 (CYANOCOBALAMIN) 500 MCG tablet Take 500 mcg by mouth daily.    . vitamin E 200 UNIT capsule Take 200 Units by mouth daily.     No current facility-administered medications for this visit.    Past Medical History:  Diagnosis Date  . Arthritis   . Dyslipidemia   . H/O: CVA (cerebrovascular accident), 06/22/15 Rt frontal rec'd TPA 08/14/2015  . Heart attack ([place]) 09/24/94  . Heart disease, unspecified   . Hypertension   . Hypothyroidism   . Mitral valve disorders(424.0)   . Obesity   . Pulmonary valve disorders   . Stroke [facility])     Past Surgical History:  Procedure  Laterality Date  . CARDIAC CATHETERIZATION  12/10/94  . CARDIAC CATHETERIZATION N/A 08/14/2015   Procedure: Left Heart Cath and Coronary Angiography;  Surgeon: Sherren Mocha, MD;  Location: [place] CV LAB;  Service: Cardiovascular;  Laterality: N/A;  . CARDIAC CATHETERIZATION N/A 11/24/2015   Procedure: Left Heart Cath and Cors/Grafts Angiography;  Surgeon: Troy Sine, MD;  Location: [place] [facility];  Service: Cardiovascular;  Laterality: N/A;  . CARDIAC CATHETERIZATION  11/24/2015   Procedure: Coronary Stent Intervention;  Surgeon: Troy Sine, MD;  Location: [place] CV LAB;  Service: Cardiovascular;;  . CORONARY  ARTERY BYPASS GRAFT N/A 08/18/2015   Procedure: CORONARY ARTERY BYPASS GRAFTING (CABG)  X 5 UTILIZING THE LEFT INTERNAL MAMMARY ARTERY AND ENDOSCOPICALLY HARVESTED RIGHT AND LEFT SAPHENEOUS VEINS.;  Surgeon: Melrose Nakayama, MD;  Location: [place];  Service: Open Heart Surgery;  Laterality: N/A;  . HERNIA REPAIR     age 50  . TEE WITHOUT CARDIOVERSION N/A 08/18/2015   Procedure: TRANSESOPHAGEAL ECHOCARDIOGRAM (TEE);  Surgeon: Melrose Nakayama, MD;  Location: [place];  Service: Open Heart Surgery;  Laterality: N/A;    Social History   Socioeconomic History  . Marital status: Married    Spouse name: Not on file  . Number of children: Not on file  . Years of education: Not on file  . Highest education level: Not on file  Occupational History  . Not on file  Tobacco Use  . Smoking status: Never Smoker  . Smokeless tobacco: Never Used  Substance and Sexual Activity  . Alcohol use: No    Alcohol/week: 0.0 standard drinks  . Drug use: No  . Sexual activity: Yes    Partners: Female  Other Topics Concern  . Not on file  Social History Narrative   Retired   Has 3 children   Married to [employer]   Regular exercise--yes 5 days/week 32min   Social Determinants of Radio broadcast assistant Strain:   . Difficulty of Paying Living Expenses:   Food Insecurity:   . Worried About Charity fundraiser in the Last Year:   . Arboriculturist in the Last Year:   Transportation Needs:   . Film/video editor (Medical):   Marland Kitchen Lack of Transportation (Non-Medical):   Physical Activity:   . Days of Exercise per Week:   . Minutes of Exercise per Session:   Stress:   . Feeling of Stress :   Social Connections:   . Frequency of Communication with Friends and Family:   . Frequency of Social Gatherings with Friends and Family:   . Attends Religious Services:   . Active Member of Clubs or Organizations:   . Attends Archivist Meetings:   Marland Kitchen Marital Status:   Intimate Partner  Violence:   . Fear of Current or Ex-Partner:   . Emotionally Abused:   Marland Kitchen Physically Abused:   . Sexually Abused:       Vitals:   03/20/20 1437  BP: (!) 142/78  Pulse: 76  SpO2: 98%  Weight: 159 lb (72.1 kg)  Height: 5\' 4"  (1.626 m)    Wt Readings from Last 3 Encounters:  03/20/20 159 lb (72.1 kg)  03/03/19 168 lb (76.2 kg)  04/14/18 158 lb (71.7 kg)     PHYSICAL EXAM General: NAD HEENT: Normal. Neck: No JVD, no thyromegaly. Lungs: Clear to auscultation bilaterally with normal respiratory effort. CV: Regular rate and rhythm, normal S1/S2, no S3/S4, no murmur.  No pretibial or periankle edema.  No carotid bruit.   Abdomen: Soft, nontender, no distention.  Neurologic: Alert and oriented.  Psych: Normal affect. Skin: Normal. Musculoskeletal: No gross deformities.      Labs: Lab Results  Component Value Date/Time   K 3.9 11/29/2015 02:45 AM   BUN 16 11/29/2015 02:45 AM   CREATININE 0.96 11/29/2015 02:45 AM   ALT 41 09/02/2015 08:19 PM   TSH 3.386 08/14/2015 06:51 PM   HGB 10.1 (L) 11/29/2015 02:45 AM     Lipids: Lab Results  Component Value Date/Time   LDLCALC 85 08/15/2015 04:41 AM   CHOL 133 08/15/2015 04:41 AM   TRIG 80 08/15/2015 04:41 AM   HDL 32 (L) 08/15/2015 04:41 AM       ASSESSMENT AND PLAN:  1.  Coronary artery disease: S/p 5-vessel CABG with PCIof left main and left circumflex in November 2016. Symptomatically stable. Continue ASA, Toprol-XL, Xarelto 2.5 mg twice daily androsuvastatin.   2. Essential [id number] pressure is mildly elevated. No changes to therapy.  3. Dyslipidemia:I will obtainacopy of lipids from PCP.Continue rosuvastatin 20 mg. Atorvastatin caused myalgias.  4. History of CVA: BP mildly elevated.On ASA and statin.  5. Chronic systolic heart failure, EF 35-40%: Euvolemic. No longer on Lasix. On Toprol-XL.  He did not tolerate Entresto.  He previously developed angioedema with lisinopril.  I will not add ACE  inhibitors or angiotensin receptor blockers.  6.  Preoperative risk stratification: He plans to undergo surgery for small bowel obstruction in the near future (exploratory laparoscopy).  He can proceed with an acceptable level of risk.  No ischemic testing is required at this time.  Xarelto can be held 3 days prior to surgery.  Aspirin can be held 5 to 7 days prior to surgery if needed.   Disposition: Follow up 1 year   Kate Sable, M.D., F.A.C.C.

## 2020-04-07 ENCOUNTER — Other Ambulatory Visit: Payer: Self-pay

## 2020-04-07 ENCOUNTER — Ambulatory Visit (INDEPENDENT_AMBULATORY_CARE_PROVIDER_SITE_OTHER): Payer: Medicare Other | Admitting: Urology

## 2020-04-07 VITALS — BP 165/89 | HR 75 | Temp 98.1°F | Ht 64.0 in | Wt 159.0 lb

## 2020-04-07 DIAGNOSIS — N138 Other obstructive and reflux uropathy: Secondary | ICD-10-CM

## 2020-04-07 DIAGNOSIS — K56609 Unspecified intestinal obstruction, unspecified as to partial versus complete obstruction: Secondary | ICD-10-CM | POA: Diagnosis not present

## 2020-04-07 DIAGNOSIS — N5201 Erectile dysfunction due to arterial insufficiency: Secondary | ICD-10-CM

## 2020-04-07 DIAGNOSIS — R351 Nocturia: Secondary | ICD-10-CM | POA: Diagnosis not present

## 2020-04-07 DIAGNOSIS — N401 Enlarged prostate with lower urinary tract symptoms: Secondary | ICD-10-CM | POA: Diagnosis not present

## 2020-04-07 DIAGNOSIS — N390 Urinary tract infection, site not specified: Secondary | ICD-10-CM | POA: Diagnosis not present

## 2020-04-07 LAB — POCT URINALYSIS DIPSTICK
Bilirubin, UA: NEGATIVE
Blood, UA: NEGATIVE
Glucose, UA: NEGATIVE
Ketones, UA: NEGATIVE
Leukocytes, UA: NEGATIVE
Nitrite, UA: NEGATIVE
Protein, UA: POSITIVE — AB
Spec Grav, UA: 1.015 (ref 1.010–1.025)
Urobilinogen, UA: NEGATIVE E.U./dL — AB
pH, UA: 5 (ref 5.0–8.0)

## 2020-04-07 MED ORDER — TAMSULOSIN HCL 0.4 MG PO CAPS: 0.4000 mg | ORAL_CAPSULE | Freq: Every day | ORAL | 3 refills | Status: DC

## 2020-04-07 MED ORDER — SILDENAFIL CITRATE 20 MG PO TABS: 20.0000 mg | ORAL_TABLET | Freq: Every day | ORAL | 3 refills | Status: DC | PRN

## 2020-04-07 NOTE — Progress Notes (Signed)
Subjective:  1. Recurrent UTI   2. BPH with urinary obstruction   3. Nocturia   4. Erectile dysfunction due to arterial insufficiency      I am having trouble with my erections.   He has had progressive ED. He is using the sildenafil with success.   He is only using 20mg .  He carries NTG but doesn't use it and knows to avoid it.     CC: Urinary Tract Infections  Adam Shelton returns today in f/u for his history of recurrent pyelonephritis. He has been on tamsulosin for BPH and BOO with an IPSS of 6.  He has nocturia x 1.  He remains on  a cranberry supplement and has done well.   His UA is clear. His PSA is 0.9.   He is to have laparoscopic exploration for recurrent SBO on Monday at [facility].   08/08/17: GU Hx: Adam Shelton is a 69 yo male who is sent in consultation by Dr. Sherrie Sport for recurrent pyelonephritis. He had right pyelonephritis in May with a multidrug resistant e. coli that was initially treated with a cefalosporin followed by macrobid but he required hospitalization for IV antibiotics. He did well but had recurrent symptoms and was admitted on 08/04/17 to [facility] for a fever of 102 and right flank pain. He had flushing and hypertension. He had dysuria on admission. He was discharged today. He was found to have e. coli on blood cultures and is currentlly on parenteral antibiotics for another 5 days. He had a CT in May 2018 that showed no stones or obstruction.    IPSS    Row Name 04/07/20 1400         International Prostate Symptom Score   How often have you had the sensation of not emptying your bladder?  Less than 1 in 5     How often have you had to urinate less than every two hours?  Less than 1 in 5 times     How often have you found you stopped and started again several times when you urinated?  Less than 1 in 5 times     How often have you found it difficult to postpone urination?  Less than 1 in 5 times     How often have you had a weak urinary stream?  Less than 1  in 5 times     How often have you had to strain to start urination?  Not at All     How many times did you typically get up at night to urinate?  1 Time     Total IPSS Score  6         ROS:  ROS:  A complete review of systems was performed.  All systems are negative except for pertinent findings as noted.   Review of Systems  All other systems reviewed and are negative.   Allergies  Allergen Reactions  . Lipitor [Atorvastatin] Other (See Comments)    Myalgias  . Lisinopril Other (See Comments)    Angioedema    Outpatient Encounter Medications as of 04/07/2020  Medication Sig Note  . acetaminophen (TYLENOL) 500 MG tablet Take 1,000 mg by mouth every 6 (six) hours as needed.   . ALPRAZolam (XANAX) 0.5 MG tablet Take 1 tablet by mouth at bedtime. 11/09/2015: Received from: External Pharmacy Received Sig:   . aspirin 81 MG EC tablet Take 81 mg by mouth daily.     . Calcium Carbonate (CALCIUM 500 PO) Take  by mouth.   . Cholecalciferol (VITAMIN D3) 1000 units CAPS Take by mouth.   . Cranberry 500 MG TABS Take by mouth daily.   . folic acid (PX FOLIC ACID) A999333 MCG tablet Take 400 mcg by mouth daily.     Marland Kitchen levothyroxine (SYNTHROID, LEVOTHROID) 100 MCG tablet Take 100 mcg by mouth daily before breakfast.   . metoprolol succinate (TOPROL-XL) 25 MG 24 hr tablet Take 1 tablet (25 mg total) by mouth 2 (two) times daily.   . Multiple Vitamins-Minerals (PRESERVISION AREDS 2 PO) Take 1 tablet by mouth every other day.   . nitroGLYCERIN (NITROSTAT) 0.4 MG SL tablet Place 1 tablet (0.4 mg total) under the tongue every 5 (five) minutes as needed for chest pain.   . rivaroxaban (XARELTO) 2.5 MG TABS tablet Take 1 tablet (2.5 mg total) by mouth 2 (two) times daily.   . rosuvastatin (CRESTOR) 20 MG tablet TAKE 1 TABLET BY MOUTH EVERY DAY   . sacubitril-valsartan (ENTRESTO) 24-26 MG Take 1 tablet by mouth 2 (two) times daily.   . tamsulosin (FLOMAX) 0.4 MG CAPS capsule Take 1 capsule (0.4 mg total) by  mouth daily.   . vitamin B-12 (CYANOCOBALAMIN) 500 MCG tablet Take 500 mcg by mouth daily.   . vitamin E 200 UNIT capsule Take 200 Units by mouth daily.   . [DISCONTINUED] tamsulosin (FLOMAX) 0.4 MG CAPS capsule Take 0.4 mg by mouth daily.   . sildenafil (REVATIO) 20 MG tablet Take 1 tablet (20 mg total) by mouth daily as needed.    No facility-administered encounter medications on file as of 04/07/2020.    Past Medical History:  Diagnosis Date  . Arthritis   . Dyslipidemia   . H/O: CVA (cerebrovascular accident), 06/22/15 Rt frontal rec'd TPA 08/14/2015  . Heart attack ([place]) 09/24/94  . Heart disease, unspecified   . Hypertension   . Hypothyroidism   . Mitral valve disorders(424.0)   . Obesity   . Pulmonary valve disorders   . Stroke [facility])     Past Surgical History:  Procedure Laterality Date  . CARDIAC CATHETERIZATION  12/10/94  . CARDIAC CATHETERIZATION N/A 08/14/2015   Procedure: Left Heart Cath and Coronary Angiography;  Surgeon: Sherren Mocha, MD;  Location: [place] CV LAB;  Service: Cardiovascular;  Laterality: N/A;  . CARDIAC CATHETERIZATION N/A 11/24/2015   Procedure: Left Heart Cath and Cors/Grafts Angiography;  Surgeon: Troy Sine, MD;  Location: [place] CV LAB;  Service: Cardiovascular;  Laterality: N/A;  . CARDIAC CATHETERIZATION  11/24/2015   Procedure: Coronary Stent Intervention;  Surgeon: Troy Sine, MD;  Location: [place] CV LAB;  Service: Cardiovascular;;  . CORONARY ARTERY BYPASS GRAFT N/A 08/18/2015   Procedure: CORONARY ARTERY BYPASS GRAFTING (CABG)  X 5 UTILIZING THE LEFT INTERNAL MAMMARY ARTERY AND ENDOSCOPICALLY HARVESTED RIGHT AND LEFT SAPHENEOUS VEINS.;  Surgeon: Melrose Nakayama, MD;  Location: [place];  Service: Open Heart Surgery;  Laterality: N/A;  . HERNIA REPAIR     age 37  . TEE WITHOUT CARDIOVERSION N/A 08/18/2015   Procedure: TRANSESOPHAGEAL ECHOCARDIOGRAM (TEE);  Surgeon: Melrose Nakayama, MD;  Location: [place];  Service:  Open Heart Surgery;  Laterality: N/A;    Social History   Socioeconomic History  . Marital status: Married    Spouse name: Not on file  . Number of children: Not on file  . Years of education: Not on file  . Highest education level: Not on file  Occupational History  . Not on file  Tobacco Use  . Smoking status: Never Smoker  . Smokeless tobacco: Never Used  Substance and Sexual Activity  . Alcohol use: No    Alcohol/week: 0.0 standard drinks  . Drug use: No  . Sexual activity: Yes    Partners: Female  Other Topics Concern  . Not on file  Social History Narrative   Retired   Has 3 children   Married to [employer]   Regular exercise--yes 5 days/week 49min   Social Determinants of Radio broadcast assistant Strain:   . Difficulty of Paying Living Expenses:   Food Insecurity:   . Worried About Charity fundraiser in the Last Year:   . Arboriculturist in the Last Year:   Transportation Needs:   . Film/video editor (Medical):   Marland Kitchen Lack of Transportation (Non-Medical):   Physical Activity:   . Days of Exercise per Week:   . Minutes of Exercise per Session:   Stress:   . Feeling of Stress :   Social Connections:   . Frequency of Communication with Friends and Family:   . Frequency of Social Gatherings with Friends and Family:   . Attends Religious Services:   . Active Member of Clubs or Organizations:   . Attends Archivist Meetings:   Marland Kitchen Marital Status:   Intimate Partner Violence:   . Fear of Current or Ex-Partner:   . Emotionally Abused:   Marland Kitchen Physically Abused:   . Sexually Abused:     Family History  Problem Relation Age of Onset  . Kidney disease Father        died age 99  . Heart disease Mother        had angioplasty       Objective: BP (!) 165/89   Pulse 75   Temp 98.1 F (36.7 C)   Ht 5\' 4"  (1.626 m)   Wt 159 lb (72.1 kg)   BMI 27.29 kg/m    Physical Exam Vitals reviewed.  Constitutional:      Appearance: Normal  appearance.  Neurological:     Mental Status: He is alert.     Lab Results:  Results for orders placed or performed in visit on 04/07/20 (from the past 24 hour(s))  POCT urinalysis dipstick     Status: Abnormal   Collection Time: 04/07/20  2:51 PM  Result Value Ref Range   Color, UA yellow    Clarity, UA clear    Glucose, UA Negative Negative   Bilirubin, UA neg    Ketones, UA neg    Spec Grav, UA 1.015 1.010 - 1.025   Blood, UA neg    pH, UA 5.0 5.0 - 8.0   Protein, UA Positive (A) Negative   Urobilinogen, UA negative (A) 0.2 or 1.0 E.U./dL   Nitrite, UA neg    Leukocytes, UA Negative Negative   Appearance clear    Odor      BMET No results for input(s): NA, K, CL, CO2, GLUCOSE, BUN, CREATININE, CALCIUM in the last 72 hours. PSA No results found for: PSA No results found for: TESTOSTERONE    Studies/Results: No results found.    Assessment & Plan: History of recurrent UTI's but is doing well without recurrences.  BPH and BOO.  I have refilled the tamsulosin.  ED.  I have refilled the sildenafil.     Meds ordered this encounter  Medications  . tamsulosin (FLOMAX) 0.4 MG CAPS capsule    Sig: Take 1  capsule (0.4 mg total) by mouth daily.    Dispense:  90 capsule    Refill:  3  . sildenafil (REVATIO) 20 MG tablet    Sig: Take 1 tablet (20 mg total) by mouth daily as needed.    Dispense:  90 tablet    Refill:  3     Orders Placed This Encounter  Procedures  . POCT urinalysis dipstick      Return in about 1 year (around 04/07/2021).   CC: Neale Burly, MD      Irine Seal 04/07/2020

## 2020-04-10 DIAGNOSIS — D3610 Benign neoplasm of peripheral nerves and autonomic nervous system, unspecified: Secondary | ICD-10-CM | POA: Diagnosis not present

## 2020-04-10 DIAGNOSIS — K5989 Other specified functional intestinal disorders: Secondary | ICD-10-CM | POA: Diagnosis not present

## 2020-04-10 DIAGNOSIS — K219 Gastro-esophageal reflux disease without esophagitis: Secondary | ICD-10-CM | POA: Diagnosis not present

## 2020-04-10 DIAGNOSIS — I5022 Chronic systolic (congestive) heart failure: Secondary | ICD-10-CM | POA: Diagnosis not present

## 2020-04-10 DIAGNOSIS — I252 Old myocardial infarction: Secondary | ICD-10-CM | POA: Diagnosis not present

## 2020-04-10 DIAGNOSIS — K56691 Other complete intestinal obstruction: Secondary | ICD-10-CM | POA: Diagnosis not present

## 2020-04-10 DIAGNOSIS — K565 Intestinal adhesions [bands], unspecified as to partial versus complete obstruction: Secondary | ICD-10-CM | POA: Diagnosis not present

## 2020-04-10 DIAGNOSIS — Z8673 Personal history of transient ischemic attack (TIA), and cerebral infarction without residual deficits: Secondary | ICD-10-CM | POA: Diagnosis not present

## 2020-04-10 DIAGNOSIS — Z7901 Long term (current) use of anticoagulants: Secondary | ICD-10-CM | POA: Diagnosis not present

## 2020-04-10 DIAGNOSIS — Z951 Presence of aortocoronary bypass graft: Secondary | ICD-10-CM | POA: Diagnosis not present

## 2020-04-10 DIAGNOSIS — E785 Hyperlipidemia, unspecified: Secondary | ICD-10-CM | POA: Diagnosis not present

## 2020-04-10 DIAGNOSIS — K56609 Unspecified intestinal obstruction, unspecified as to partial versus complete obstruction: Secondary | ICD-10-CM | POA: Diagnosis not present

## 2020-04-10 DIAGNOSIS — Z888 Allergy status to other drugs, medicaments and biological substances status: Secondary | ICD-10-CM | POA: Diagnosis not present

## 2020-04-10 DIAGNOSIS — E039 Hypothyroidism, unspecified: Secondary | ICD-10-CM | POA: Diagnosis not present

## 2020-04-10 DIAGNOSIS — I2510 Atherosclerotic heart disease of native coronary artery without angina pectoris: Secondary | ICD-10-CM | POA: Diagnosis not present

## 2020-04-10 DIAGNOSIS — I110 Hypertensive heart disease with heart failure: Secondary | ICD-10-CM | POA: Diagnosis not present

## 2020-04-10 DIAGNOSIS — Z7982 Long term (current) use of aspirin: Secondary | ICD-10-CM | POA: Diagnosis not present

## 2020-04-10 DIAGNOSIS — K5651 Intestinal adhesions [bands], with partial obstruction: Secondary | ICD-10-CM | POA: Diagnosis not present

## 2020-04-10 DIAGNOSIS — Z79899 Other long term (current) drug therapy: Secondary | ICD-10-CM | POA: Diagnosis not present

## 2020-04-12 DIAGNOSIS — K567 Ileus, unspecified: Secondary | ICD-10-CM | POA: Diagnosis not present

## 2020-04-25 DIAGNOSIS — Z0000 Encounter for general adult medical examination without abnormal findings: Secondary | ICD-10-CM | POA: Diagnosis not present

## 2020-04-25 DIAGNOSIS — K56609 Unspecified intestinal obstruction, unspecified as to partial versus complete obstruction: Secondary | ICD-10-CM | POA: Diagnosis not present

## 2020-05-04 DIAGNOSIS — D3610 Benign neoplasm of peripheral nerves and autonomic nervous system, unspecified: Secondary | ICD-10-CM | POA: Diagnosis not present

## 2020-05-04 DIAGNOSIS — Z483 Aftercare following surgery for neoplasm: Secondary | ICD-10-CM | POA: Diagnosis not present

## 2020-05-20 ENCOUNTER — Other Ambulatory Visit: Payer: Self-pay | Admitting: Cardiovascular Disease

## 2020-07-06 DIAGNOSIS — Z0000 Encounter for general adult medical examination without abnormal findings: Secondary | ICD-10-CM | POA: Diagnosis not present

## 2020-07-06 DIAGNOSIS — I10 Essential (primary) hypertension: Secondary | ICD-10-CM | POA: Diagnosis not present

## 2020-07-06 DIAGNOSIS — I2510 Atherosclerotic heart disease of native coronary artery without angina pectoris: Secondary | ICD-10-CM | POA: Diagnosis not present

## 2020-07-06 DIAGNOSIS — Z8673 Personal history of transient ischemic attack (TIA), and cerebral infarction without residual deficits: Secondary | ICD-10-CM | POA: Diagnosis not present

## 2020-07-13 DIAGNOSIS — J90 Pleural effusion, not elsewhere classified: Secondary | ICD-10-CM | POA: Diagnosis not present

## 2020-07-13 DIAGNOSIS — I2510 Atherosclerotic heart disease of native coronary artery without angina pectoris: Secondary | ICD-10-CM | POA: Diagnosis not present

## 2020-07-13 DIAGNOSIS — K3189 Other diseases of stomach and duodenum: Secondary | ICD-10-CM | POA: Diagnosis not present

## 2020-07-13 DIAGNOSIS — K2900 Acute gastritis without bleeding: Secondary | ICD-10-CM | POA: Diagnosis not present

## 2020-07-13 DIAGNOSIS — I700 Atherosclerosis of aorta: Secondary | ICD-10-CM | POA: Diagnosis not present

## 2020-07-13 DIAGNOSIS — K5730 Diverticulosis of large intestine without perforation or abscess without bleeding: Secondary | ICD-10-CM | POA: Diagnosis not present

## 2020-07-13 DIAGNOSIS — R1012 Left upper quadrant pain: Secondary | ICD-10-CM | POA: Diagnosis not present

## 2020-07-13 DIAGNOSIS — R1013 Epigastric pain: Secondary | ICD-10-CM | POA: Diagnosis not present

## 2020-07-13 DIAGNOSIS — Z8673 Personal history of transient ischemic attack (TIA), and cerebral infarction without residual deficits: Secondary | ICD-10-CM | POA: Diagnosis not present

## 2020-07-13 DIAGNOSIS — K6389 Other specified diseases of intestine: Secondary | ICD-10-CM | POA: Diagnosis not present

## 2020-07-14 DIAGNOSIS — K6389 Other specified diseases of intestine: Secondary | ICD-10-CM | POA: Diagnosis not present

## 2020-07-14 DIAGNOSIS — K3189 Other diseases of stomach and duodenum: Secondary | ICD-10-CM | POA: Diagnosis not present

## 2020-07-14 DIAGNOSIS — K5730 Diverticulosis of large intestine without perforation or abscess without bleeding: Secondary | ICD-10-CM | POA: Diagnosis not present

## 2020-07-20 DIAGNOSIS — K2970 Gastritis, unspecified, without bleeding: Secondary | ICD-10-CM | POA: Diagnosis not present

## 2020-08-28 DIAGNOSIS — K219 Gastro-esophageal reflux disease without esophagitis: Secondary | ICD-10-CM | POA: Diagnosis not present

## 2020-09-05 ENCOUNTER — Other Ambulatory Visit: Payer: Self-pay | Admitting: *Deleted

## 2020-09-05 MED ORDER — RIVAROXABAN 2.5 MG PO TABS: 2.5000 mg | ORAL_TABLET | Freq: Two times a day (BID) | ORAL | 1 refills | Status: AC

## 2020-09-21 ENCOUNTER — Other Ambulatory Visit: Payer: Self-pay | Admitting: *Deleted

## 2020-09-21 MED ORDER — ROSUVASTATIN CALCIUM 20 MG PO TABS: 20.0000 mg | ORAL_TABLET | Freq: Every day | ORAL | 1 refills | Status: AC

## 2020-09-28 DIAGNOSIS — K5730 Diverticulosis of large intestine without perforation or abscess without bleeding: Secondary | ICD-10-CM | POA: Diagnosis not present

## 2020-09-28 DIAGNOSIS — Z888 Allergy status to other drugs, medicaments and biological substances status: Secondary | ICD-10-CM | POA: Diagnosis not present

## 2020-09-28 DIAGNOSIS — I2510 Atherosclerotic heart disease of native coronary artery without angina pectoris: Secondary | ICD-10-CM | POA: Diagnosis not present

## 2020-09-28 DIAGNOSIS — R109 Unspecified abdominal pain: Secondary | ICD-10-CM | POA: Diagnosis not present

## 2020-09-28 DIAGNOSIS — R1084 Generalized abdominal pain: Secondary | ICD-10-CM | POA: Diagnosis not present

## 2020-09-28 DIAGNOSIS — R9431 Abnormal electrocardiogram [ECG] [EKG]: Secondary | ICD-10-CM | POA: Diagnosis not present

## 2020-09-28 DIAGNOSIS — I119 Hypertensive heart disease without heart failure: Secondary | ICD-10-CM | POA: Diagnosis not present

## 2020-10-09 DIAGNOSIS — I5022 Chronic systolic (congestive) heart failure: Secondary | ICD-10-CM | POA: Diagnosis not present

## 2020-10-09 DIAGNOSIS — I10 Essential (primary) hypertension: Secondary | ICD-10-CM | POA: Diagnosis not present

## 2020-10-09 DIAGNOSIS — E785 Hyperlipidemia, unspecified: Secondary | ICD-10-CM | POA: Diagnosis not present

## 2020-10-09 DIAGNOSIS — I2510 Atherosclerotic heart disease of native coronary artery without angina pectoris: Secondary | ICD-10-CM | POA: Diagnosis not present

## 2020-10-12 DIAGNOSIS — K219 Gastro-esophageal reflux disease without esophagitis: Secondary | ICD-10-CM | POA: Diagnosis not present

## 2020-10-12 DIAGNOSIS — E039 Hypothyroidism, unspecified: Secondary | ICD-10-CM | POA: Diagnosis not present

## 2020-10-12 DIAGNOSIS — K581 Irritable bowel syndrome with constipation: Secondary | ICD-10-CM | POA: Diagnosis not present

## 2020-10-13 DIAGNOSIS — R109 Unspecified abdominal pain: Secondary | ICD-10-CM | POA: Diagnosis not present

## 2020-10-13 DIAGNOSIS — D509 Iron deficiency anemia, unspecified: Secondary | ICD-10-CM | POA: Diagnosis not present

## 2020-10-24 ENCOUNTER — Encounter (INDEPENDENT_AMBULATORY_CARE_PROVIDER_SITE_OTHER): Payer: Self-pay | Admitting: *Deleted

## 2020-11-10 DIAGNOSIS — K219 Gastro-esophageal reflux disease without esophagitis: Secondary | ICD-10-CM | POA: Diagnosis not present

## 2020-11-10 DIAGNOSIS — Z131 Encounter for screening for diabetes mellitus: Secondary | ICD-10-CM | POA: Diagnosis not present

## 2020-11-10 DIAGNOSIS — D508 Other iron deficiency anemias: Secondary | ICD-10-CM | POA: Diagnosis not present

## 2020-11-10 DIAGNOSIS — K581 Irritable bowel syndrome with constipation: Secondary | ICD-10-CM | POA: Diagnosis not present

## 2020-11-10 DIAGNOSIS — E039 Hypothyroidism, unspecified: Secondary | ICD-10-CM | POA: Diagnosis not present

## 2020-11-30 ENCOUNTER — Other Ambulatory Visit (INDEPENDENT_AMBULATORY_CARE_PROVIDER_SITE_OTHER): Payer: Self-pay

## 2020-11-30 ENCOUNTER — Encounter (INDEPENDENT_AMBULATORY_CARE_PROVIDER_SITE_OTHER): Payer: Self-pay | Admitting: Gastroenterology

## 2020-11-30 ENCOUNTER — Other Ambulatory Visit: Payer: Self-pay

## 2020-11-30 ENCOUNTER — Encounter (INDEPENDENT_AMBULATORY_CARE_PROVIDER_SITE_OTHER): Payer: Self-pay

## 2020-11-30 ENCOUNTER — Ambulatory Visit (INDEPENDENT_AMBULATORY_CARE_PROVIDER_SITE_OTHER): Payer: Medicare Other | Admitting: Gastroenterology

## 2020-11-30 ENCOUNTER — Telehealth (INDEPENDENT_AMBULATORY_CARE_PROVIDER_SITE_OTHER): Payer: Self-pay

## 2020-11-30 DIAGNOSIS — R790 Abnormal level of blood mineral: Secondary | ICD-10-CM

## 2020-11-30 DIAGNOSIS — Z8719 Personal history of other diseases of the digestive system: Secondary | ICD-10-CM | POA: Diagnosis not present

## 2020-11-30 MED ORDER — PLENVU 140 G PO SOLR: 280.0000 g | Freq: Once | ORAL | 0 refills | Status: AC

## 2020-11-30 NOTE — Patient Instructions (Addendum)
Schedule EGD and colonoscopy Perform blood workup Perform stool testing Continue Miralax daily

## 2020-11-30 NOTE — Progress Notes (Signed)
Maylon Peppers, M.D. Gastroenterology & Hepatology [facility] [address] [place], [place] [address] Primary Care Physician: Neale Burly, MD [place] [id number]  Referring MD: PCP  I will communicate my assessment and recommendations to the referring MD via EMR. Note: Occasional unusual wording and randomly placed punctuation marks may result from the use of speech recognition technology to transcribe this document"  Chief Complaint:  Small bowel obstruction and abdominal pain  History of Present Illness: Adam Shelton is a 69 y.o. male from [place] with PMH CAD, h/o stroke on Xarelto, HTN, hx of SBO, systolic HFwho presents for evaluation after terminal ileum resection due to recurrent small bowel obstruction episodes.  The patient was admitted to [facility] on 04/10/2020 after presenting with nightly episodes of small bowel obstruction.  Patient was taking for laparoscopy, underwent resection of the distal ileum as the was found to be thickened without any complication.  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  Since then, the patient has presented mild lower abdominal discomfort which he has treated with dietary changes by avoiding eating heavy meals. He also states that he has been eating a bland diet, avoiding goat meat.  Notably, the patient had to strain significantly to have bowel movements and was advised to take MiraLAX on a daily basis, he has been compliant with these and since then his abdominal pain has resolved.  The patient denies having any nausea, vomiting, fever, chills, hematochezia, melena, hematemesis, abdominal distention, abdominal pain, diarrhea, jaundice, pruritus.  Notably, the patient had an unclear history of perianal fistula, which he reports was repaired in [place] in 1995. No reports are  available.  On the other hand, the patient has a history of iron deficiency.  Patient has been taken oral iron for the last month with improvement of the levels per the patient. Review of outside labs showed that patient had blood testing performed on 10/13/2020. His ferritin was 10, iron 32, TIBC 342 and iron saturation 9%, folate was 20, hemoglobin was 12.8 with MCV of 73.  States he has lost 4 lb in the last month as he has changed his diet.  Last EGD: never Last Colonoscopy:never  FHx: neg for any gastrointestinal/liver disease, no malignancies Social: neg smoking, alcohol or illicit drug use Surgical: hernia repair  Past Medical History: Past Medical History:  Diagnosis Date  . Arthritis   . Dyslipidemia   . GERD (gastroesophageal reflux disease)   . H/O: CVA (cerebrovascular accident), 06/22/15 Rt frontal rec'd TPA 08/14/2015  . Heart attack ([place]) 09/24/94  . Heart disease, unspecified   . Hypertension   . Hypothyroidism   . Hypothyroidism   . Irritable bowel syndrome   . Mitral valve disorders(424.0)   . Obesity   . Pulmonary valve disorders   . Stroke [facility])     Past Surgical History: Past Surgical History:  Procedure Laterality Date  . CARDIAC CATHETERIZATION  12/10/94  . CARDIAC CATHETERIZATION N/A 08/14/2015   Procedure: Left Heart Cath and Coronary Angiography;  Surgeon: Sherren Mocha, MD;  Location: [place] CV LAB;  Service: Cardiovascular;  Laterality: N/A;  . CARDIAC CATHETERIZATION N/A 11/24/2015   Procedure: Left Heart Cath and Cors/Grafts Angiography;  Surgeon: Troy Sine, MD;  Location: [place] CV LAB;  Service: Cardiovascular;  Laterality: N/A;  . CARDIAC CATHETERIZATION  11/24/2015   Procedure: Coronary Stent  Intervention;  Surgeon: Troy Sine, MD;  Location: [place] CV LAB;  Service: Cardiovascular;;  . CORONARY ARTERY BYPASS GRAFT N/A 08/18/2015   Procedure: CORONARY ARTERY BYPASS GRAFTING (CABG)  X 5 UTILIZING THE LEFT INTERNAL  MAMMARY ARTERY AND ENDOSCOPICALLY HARVESTED RIGHT AND LEFT SAPHENEOUS VEINS.;  Surgeon: Melrose Nakayama, MD;  Location: [place];  Service: Open Heart Surgery;  Laterality: N/A;  . HERNIA REPAIR     age 47  . TEE WITHOUT CARDIOVERSION N/A 08/18/2015   Procedure: TRANSESOPHAGEAL ECHOCARDIOGRAM (TEE);  Surgeon: Melrose Nakayama, MD;  Location: [place];  Service: Open Heart Surgery;  Laterality: N/A;    Family History: Family History  Problem Relation Age of Onset  . Kidney disease Father        died age 34  . Heart disease Mother        had angioplasty    Social History: Social History   Tobacco Use  Smoking Status Never Smoker  Smokeless Tobacco Never Used   Social History   Substance and Sexual Activity  Alcohol Use No  . Alcohol/week: 0.0 standard drinks   Social History   Substance and Sexual Activity  Drug Use No    Allergies: Allergies  Allergen Reactions  . Lipitor [Atorvastatin] Other (See Comments)    Myalgias  . Lisinopril Other (See Comments)    Angioedema    Medications: Current Outpatient Medications  Medication Sig Dispense Refill  . acetaminophen (TYLENOL) 500 MG tablet Take 1,000 mg by mouth every 6 (six) hours as needed.    . ALPRAZolam (XANAX) 0.5 MG tablet Take 1 tablet by mouth at bedtime.  0  . aspirin 81 MG EC tablet Take 81 mg by mouth daily.      . Calcium Carbonate (CALCIUM 500 PO) Take by mouth.    . Cholecalciferol (VITAMIN D3) 1000 units CAPS Take by mouth.    . Cranberry 500 MG TABS Take by mouth daily.    Marland Kitchen esomeprazole (NEXIUM) 40 MG capsule Take 40 mg by mouth daily at 12 noon. As needed.    . famotidine (PEPCID) 20 MG tablet Take 20 mg by mouth 2 (two) times daily.    . folic acid (PX FOLIC ACID) 308 MCG tablet Take 400 mcg by mouth daily.      Marland Kitchen levothyroxine (SYNTHROID, LEVOTHROID) 100 MCG tablet Take 100 mcg by mouth daily before breakfast.    . metoprolol succinate (TOPROL-XL) 25 MG 24 hr tablet Take 1 tablet (25 mg total)  by mouth 2 (two) times daily. 180 tablet 3  . nitroGLYCERIN (NITROSTAT) 0.4 MG SL tablet Place 1 tablet (0.4 mg total) under the tongue every 5 (five) minutes as needed for chest pain. 25 tablet 3  . polyethylene glycol (MIRALAX / GLYCOLAX) 17 g packet Take 17 g by mouth daily.    . rivaroxaban (XARELTO) 2.5 MG TABS tablet Take 1 tablet (2.5 mg total) by mouth 2 (two) times daily. 180 tablet 1  . rosuvastatin (CRESTOR) 20 MG tablet Take 1 tablet (20 mg total) by mouth daily. 90 tablet 1  . sildenafil (REVATIO) 20 MG tablet Take 1 tablet (20 mg total) by mouth daily as needed. 90 tablet 3  . tamsulosin (FLOMAX) 0.4 MG CAPS capsule Take 1 capsule (0.4 mg total) by mouth daily. 90 capsule 3  . vitamin E 200 UNIT capsule Take 200 Units by mouth daily.    . Multiple Vitamins-Minerals (PRESERVISION AREDS 2 PO) Take 1 tablet by mouth every other day. (  Patient not taking: Reported on 11/30/2020)    . sacubitril-valsartan (ENTRESTO) 24-26 MG Take 1 tablet by mouth 2 (two) times daily. (Patient not taking: Reported on 11/30/2020) 60 tablet 6  . vitamin B-12 (CYANOCOBALAMIN) 500 MCG tablet Take 500 mcg by mouth daily. (Patient not taking: Reported on 11/30/2020)     No current facility-administered medications for this visit.    Review of Systems: GENERAL: negative for malaise, night sweats HEENT: No changes in hearing or vision, no nose bleeds or other nasal problems. NECK: Negative for lumps, goiter, pain and significant neck swelling RESPIRATORY: Negative for cough, wheezing CARDIOVASCULAR: Negative for chest pain, leg swelling, palpitations, orthopnea GI: SEE HPI MUSCULOSKELETAL: Negative for joint pain or swelling, back pain, and muscle pain. SKIN: Negative for lesions, rash PSYCH: Negative for sleep disturbance, mood disorder and recent psychosocial stressors. HEMATOLOGY Negative for prolonged bleeding, bruising easily, and swollen nodes. ENDOCRINE: Negative for cold or heat intolerance, polyuria,  polydipsia and goiter. NEURO: negative for tremor, gait imbalance, syncope and seizures. The remainder of the review of systems is noncontributory.   Physical Exam: BP (!) 153/87 (BP Location: Left Arm, Patient Position: Sitting, Cuff Size: Large)   Pulse 89   Temp 98.2 F (36.8 C) (Oral)   Ht 5\' 5"  (1.651 m)   Wt 156 lb (70.8 kg)   BMI 25.96 kg/m  GENERAL: The patient is AO x3, in no acute distress. HEENT: Head is normocephalic and atraumatic. EOMI are intact. Mouth is well hydrated and without lesions. NECK: Supple. No masses LUNGS: Clear to auscultation. No presence of rhonchi/wheezing/rales. Adequate chest expansion HEART: RRR, normal s1 and s2. ABDOMEN: mildly tender in the periumbilical area, no guarding, no peritoneal signs, and nondistended. BS +. No masses. EXTREMITIES: Without any cyanosis, clubbing, rash, lesions or edema. NEUROLOGIC: AOx3, no focal motor deficit. SKIN: no jaundice, no rashes   Imaging/Labs: as above  I personally reviewed and interpreted the available labs, imaging and endoscopic files.  Impression and Plan: Adam Shelton is a 69 y.o. male from [place] with PMH CAD, h/o stroke on Xarelto, HTN, hx of SBO, systolic HFwho presents for evaluation after terminal ileum resection due to recurrent small bowel obstruction episodes. The patient presented recurrent episodes of small bowel obstruction which seems to be related to a granuloma that was removed successfully this year. He has not presented more episodes of small bowel obstruction but has presented recurrent abdominal pain which seems to be getting better after he has been on MiraLAX. It is possible that this was all related to constipation which I advised him to continue. Notably, he was found to have iron deficiency with microcytic red blood cells (still having significant anemia). We will investigate this further with an EGD and a colonoscopy. We will also obtain CBC, CMP, CRP magnesium and repeat iron  stores. We will also check parasites in stool for the evaluation of iron deficiency, especially as he is coming from an area endemic for parasites.  - Schedule EGD and colonoscopy - Check  CBC, CMP, CRP magnesium and repeat iron stores - O/P in stool - Continue Miralax daily  All questions were answered.      Maylon Peppers, MD Gastroenterology and Hepatology [facility]

## 2020-11-30 NOTE — Telephone Encounter (Signed)
Adam Shelton, CMA  

## 2020-12-01 LAB — CBC WITH DIFFERENTIAL/PLATELET
Absolute Monocytes: 903 cells/uL (ref 200–950)
Basophils Absolute: 57 cells/uL (ref 0–200)
Basophils Relative: 0.6 %
Eosinophils Absolute: 580 cells/uL — ABNORMAL HIGH (ref 15–500)
Eosinophils Relative: 6.1 %
HCT: 43.2 % (ref 38.5–50.0)
Hemoglobin: 13.9 g/dL (ref 13.2–17.1)
Lymphs Abs: 2337 cells/uL (ref 850–3900)
MCH: 24.7 pg — ABNORMAL LOW (ref 27.0–33.0)
MCHC: 32.2 g/dL (ref 32.0–36.0)
MCV: 76.9 fL — ABNORMAL LOW (ref 80.0–100.0)
MPV: 11.2 fL (ref 7.5–12.5)
Monocytes Relative: 9.5 %
Neutro Abs: 5624 cells/uL (ref 1500–7800)
Neutrophils Relative %: 59.2 %
Platelets: 257 10*3/uL (ref 140–400)
RBC: 5.62 10*6/uL (ref 4.20–5.80)
RDW: 16.1 % — ABNORMAL HIGH (ref 11.0–15.0)
Total Lymphocyte: 24.6 %
WBC: 9.5 10*3/uL (ref 3.8–10.8)

## 2020-12-01 LAB — COMPREHENSIVE METABOLIC PANEL
AG Ratio: 1.3 (calc) (ref 1.0–2.5)
ALT: 13 U/L (ref 9–46)
AST: 15 U/L (ref 10–35)
Albumin: 4.4 g/dL (ref 3.6–5.1)
Alkaline phosphatase (APISO): 88 U/L (ref 35–144)
BUN: 20 mg/dL (ref 7–25)
CO2: 23 mmol/L (ref 20–32)
Calcium: 9.9 mg/dL (ref 8.6–10.3)
Chloride: 103 mmol/L (ref 98–110)
Creat: 1.09 mg/dL (ref 0.70–1.25)
Globulin: 3.5 g/dL (calc) (ref 1.9–3.7)
Glucose, Bld: 91 mg/dL (ref 65–99)
Potassium: 4.3 mmol/L (ref 3.5–5.3)
Sodium: 139 mmol/L (ref 135–146)
Total Bilirubin: 0.3 mg/dL (ref 0.2–1.2)
Total Protein: 7.9 g/dL (ref 6.1–8.1)

## 2020-12-01 LAB — IRON, TOTAL/TOTAL IRON BINDING CAP
%SAT: 7 % (calc) — ABNORMAL LOW (ref 20–48)
Iron: 25 ug/dL — ABNORMAL LOW (ref 50–180)
TIBC: 342 mcg/dL (calc) (ref 250–425)

## 2020-12-01 LAB — C-REACTIVE PROTEIN: CRP: 18.5 mg/L — ABNORMAL HIGH (ref <8.0)

## 2020-12-01 LAB — MAGNESIUM: Magnesium: 1.8 mg/dL (ref 1.5–2.5)

## 2020-12-01 LAB — FERRITIN: Ferritin: 22 ng/mL — ABNORMAL LOW (ref 24–380)

## 2020-12-04 ENCOUNTER — Telehealth (INDEPENDENT_AMBULATORY_CARE_PROVIDER_SITE_OTHER): Payer: Self-pay

## 2020-12-04 DIAGNOSIS — R790 Abnormal level of blood mineral: Secondary | ICD-10-CM

## 2020-12-04 LAB — OVA AND PARASITE EXAMINATION
CONCENTRATE RESULT:: NONE SEEN
MICRO NUMBER:: 11269833
SPECIMEN QUALITY:: ADEQUATE
TRICHROME RESULT:: NONE SEEN

## 2020-12-04 MED ORDER — NA SULFATE-K SULFATE-MG SULF 17.5-3.13-1.6 GM/177ML PO SOLN: 354.0000 mL | Freq: Once | ORAL | 0 refills | Status: AC

## 2020-12-05 ENCOUNTER — Encounter (INDEPENDENT_AMBULATORY_CARE_PROVIDER_SITE_OTHER): Payer: Self-pay | Admitting: Gastroenterology

## 2020-12-05 ENCOUNTER — Other Ambulatory Visit (INDEPENDENT_AMBULATORY_CARE_PROVIDER_SITE_OTHER): Payer: Self-pay | Admitting: Gastroenterology

## 2020-12-05 NOTE — Progress Notes (Signed)
I obtained the results from his previous surgical pathology.  Patient had a partial resection of small bowel due to stricture, 9.1 cm in length.  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  6 lymph nodes were negative for malignancy.  Due to negative margins, no further management is warranted for this.  Maylon Peppers, MD Gastroenterology and Hepatology [facility]

## 2020-12-05 NOTE — Progress Notes (Signed)
I received the results of his surgical pathology from 04/10/2020 which showed a 9.1 cm small bowel segment with histologic findings consistent with a ganglioneuroma with associated mucosal ulceration.  Margins were uninvolved.  6 lymph nodes were removed and were negative for malignancy. Due to these findings, no further management is warranted for surveillance.  Maylon Peppers, MD Gastroenterology and Hepatology [facility]

## 2020-12-06 NOTE — Telephone Encounter (Signed)
LeighAnn Ladesha Pacini, CMA  

## 2020-12-06 NOTE — Telephone Encounter (Signed)
Adam Shelton, CMA  

## 2020-12-20 DIAGNOSIS — K219 Gastro-esophageal reflux disease without esophagitis: Secondary | ICD-10-CM | POA: Diagnosis not present

## 2020-12-20 DIAGNOSIS — J069 Acute upper respiratory infection, unspecified: Secondary | ICD-10-CM | POA: Diagnosis not present

## 2020-12-20 DIAGNOSIS — I5022 Chronic systolic (congestive) heart failure: Secondary | ICD-10-CM | POA: Diagnosis not present

## 2020-12-20 DIAGNOSIS — R7989 Other specified abnormal findings of blood chemistry: Secondary | ICD-10-CM | POA: Diagnosis not present

## 2020-12-20 DIAGNOSIS — I252 Old myocardial infarction: Secondary | ICD-10-CM | POA: Diagnosis not present

## 2020-12-20 DIAGNOSIS — Z951 Presence of aortocoronary bypass graft: Secondary | ICD-10-CM | POA: Diagnosis not present

## 2020-12-20 DIAGNOSIS — R059 Cough, unspecified: Secondary | ICD-10-CM | POA: Diagnosis not present

## 2020-12-20 DIAGNOSIS — I110 Hypertensive heart disease with heart failure: Secondary | ICD-10-CM | POA: Diagnosis not present

## 2020-12-20 DIAGNOSIS — I2510 Atherosclerotic heart disease of native coronary artery without angina pectoris: Secondary | ICD-10-CM | POA: Diagnosis not present

## 2020-12-20 DIAGNOSIS — E079 Disorder of thyroid, unspecified: Secondary | ICD-10-CM | POA: Diagnosis not present

## 2020-12-20 DIAGNOSIS — R918 Other nonspecific abnormal finding of lung field: Secondary | ICD-10-CM | POA: Diagnosis not present

## 2020-12-20 DIAGNOSIS — Z8673 Personal history of transient ischemic attack (TIA), and cerebral infarction without residual deficits: Secondary | ICD-10-CM | POA: Diagnosis not present

## 2020-12-20 DIAGNOSIS — E785 Hyperlipidemia, unspecified: Secondary | ICD-10-CM | POA: Diagnosis not present

## 2020-12-20 DIAGNOSIS — Z955 Presence of coronary angioplasty implant and graft: Secondary | ICD-10-CM | POA: Diagnosis not present

## 2020-12-20 DIAGNOSIS — Z20822 Contact with and (suspected) exposure to covid-19: Secondary | ICD-10-CM | POA: Diagnosis not present

## 2020-12-20 DIAGNOSIS — R0602 Shortness of breath: Secondary | ICD-10-CM | POA: Diagnosis not present

## 2020-12-20 DIAGNOSIS — Z7982 Long term (current) use of aspirin: Secondary | ICD-10-CM | POA: Diagnosis not present

## 2020-12-26 DIAGNOSIS — I5033 Acute on chronic diastolic (congestive) heart failure: Secondary | ICD-10-CM | POA: Diagnosis not present

## 2020-12-26 DIAGNOSIS — E039 Hypothyroidism, unspecified: Secondary | ICD-10-CM | POA: Diagnosis not present

## 2020-12-26 DIAGNOSIS — J40 Bronchitis, not specified as acute or chronic: Secondary | ICD-10-CM | POA: Diagnosis not present

## 2020-12-26 DIAGNOSIS — K219 Gastro-esophageal reflux disease without esophagitis: Secondary | ICD-10-CM | POA: Diagnosis not present

## 2020-12-26 DIAGNOSIS — K581 Irritable bowel syndrome with constipation: Secondary | ICD-10-CM | POA: Diagnosis not present

## 2021-01-08 ENCOUNTER — Other Ambulatory Visit (INDEPENDENT_AMBULATORY_CARE_PROVIDER_SITE_OTHER): Payer: Self-pay

## 2021-01-09 ENCOUNTER — Telehealth (INDEPENDENT_AMBULATORY_CARE_PROVIDER_SITE_OTHER): Payer: Self-pay

## 2021-01-09 DIAGNOSIS — I2510 Atherosclerotic heart disease of native coronary artery without angina pectoris: Secondary | ICD-10-CM | POA: Diagnosis not present

## 2021-01-09 DIAGNOSIS — I5022 Chronic systolic (congestive) heart failure: Secondary | ICD-10-CM | POA: Diagnosis not present

## 2021-01-09 DIAGNOSIS — E785 Hyperlipidemia, unspecified: Secondary | ICD-10-CM | POA: Diagnosis not present

## 2021-01-09 DIAGNOSIS — I10 Essential (primary) hypertension: Secondary | ICD-10-CM | POA: Diagnosis not present

## 2021-01-09 NOTE — Telephone Encounter (Signed)
Noted this procedure has been canceled at this time

## 2021-01-09 NOTE — Pre-Procedure Instructions (Signed)
Interoffice message sent to Dr Jenetta Downer as to what patient should do about his eliquis before his procedure.

## 2021-01-09 NOTE — Patient Instructions (Signed)
Adam Shelton  01/09/2021     @PREFPERIOPPHARMACY @   Your procedure is scheduled on  01/13/2020.  Report to [facility] at  [email or web address].  Call this number if you have problems the morning of surgery:  [phone]   Remember:  Follow the diet and prep instructions given to you by the office.                    Take these medicines the morning of surgery with A SIP OF WATER  Nexium, pepcid, levothyroxine, metoprolol, zofran(if needed), flomax.    Do not wear jewelry, make-up or nail polish.  Do not wear lotions, powders, or perfumes, or deodorant. Please brush your teeth.  Do not shave 48 hours prior to surgery.  Men may shave face and neck.  Do not bring valuables to the hospital.  [facility] is not responsible for any belongings or valuables.  Contacts, dentures or bridgework may not be worn into surgery.  Leave your suitcase in the car.  After surgery it may be brought to your room.  For patients admitted to the hospital, discharge time will be determined by your treatment team.  Patients discharged the day of surgery will not be allowed to drive home.   Name and phone number of your driver:   family Special instructions:   DO NOT smoke the morning of your procedure.  Please read over the following fact sheets that you were given. Anesthesia Post-op Instructions and Care and Recovery After Surgery       Upper Endoscopy, Adult, Care After This sheet gives you information about how to care for yourself after your procedure. Your health care provider may also give you more specific instructions. If you have problems or questions, contact your health care provider. What can I expect after the procedure? After the procedure, it is common to have:  A sore throat.  Mild stomach pain or discomfort.  Bloating.  Nausea. Follow these instructions at home:  Follow instructions from your health care provider about what to eat or drink after your  procedure.  Return to your normal activities as told by your health care provider. Ask your health care provider what activities are safe for you.  Take over-the-counter and prescription medicines only as told by your health care provider.  If you were given a sedative during the procedure, it can affect you for several hours. Do not drive or operate machinery until your health care provider says that it is safe.  Keep all follow-up visits as told by your health care provider. This is important.   Contact a health care provider if you have:  A sore throat that lasts longer than one day.  Trouble swallowing. Get help right away if:  You vomit blood or your vomit looks like coffee grounds.  You have: ? A fever. ? Bloody, black, or tarry stools. ? A severe sore throat or you cannot swallow. ? Difficulty breathing. ? Severe pain in your chest or abdomen. Summary  After the procedure, it is common to have a sore throat, mild stomach discomfort, bloating, and nausea.  If you were given a sedative during the procedure, it can affect you for several hours. Do not drive or operate machinery until your health care provider says that it is safe.  Follow instructions from your health care provider about what to eat or drink after your procedure.  Return to your normal activities  as told by your health care provider. This information is not intended to replace advice given to you by your health care provider. Make sure you discuss any questions you have with your health care provider. Document Revised: 12/14/2019 Document Reviewed: 05/18/2018 Elsevier Patient Education  2021 [place].  Colonoscopy, Adult, Care After This sheet gives you information about how to care for yourself after your procedure. Your health care provider may also give you more specific instructions. If you have problems or questions, contact your health care provider. What can I expect after the procedure? After  the procedure, it is common to have:  A small amount of blood in your stool for 24 hours after the procedure.  Some gas.  Mild cramping or bloating of your abdomen. Follow these instructions at home: Eating and drinking  Drink enough fluid to keep your urine pale yellow.  Follow instructions from your health care provider about eating or drinking restrictions.  Resume your normal diet as instructed by your health care provider. Avoid heavy or fried foods that are hard to digest.   Activity  Rest as told by your health care provider.  Avoid sitting for a long time without moving. Get up to take short walks every 1-2 hours. This is important to improve blood flow and breathing. Ask for help if you feel weak or unsteady.  Return to your normal activities as told by your health care provider. Ask your health care provider what activities are safe for you. Managing cramping and bloating  Try walking around when you have cramps or feel bloated.  Apply heat to your abdomen as told by your health care provider. Use the heat source that your health care provider recommends, such as a moist heat pack or a heating pad. ? Place a towel between your skin and the heat source. ? Leave the heat on for 20-30 minutes. ? Remove the heat if your skin turns bright red. This is especially important if you are unable to feel pain, heat, or cold. You may have a greater risk of getting burned.   General instructions  If you were given a sedative during the procedure, it can affect you for several hours. Do not drive or operate machinery until your health care provider says that it is safe.  For the first 24 hours after the procedure: ? Do not sign important documents. ? Do not drink alcohol. ? Do your regular daily activities at a slower pace than normal. ? Eat soft foods that are easy to digest.  Take over-the-counter and prescription medicines only as told by your health care provider.  Keep all  follow-up visits as told by your health care provider. This is important. Contact a health care provider if:  You have blood in your stool 2-3 days after the procedure. Get help right away if you have:  More than a small spotting of blood in your stool.  Large blood clots in your stool.  Swelling of your abdomen.  Nausea or vomiting.  A fever.  Increasing pain in your abdomen that is not relieved with medicine. Summary  After the procedure, it is common to have a small amount of blood in your stool. You may also have mild cramping and bloating of your abdomen.  If you were given a sedative during the procedure, it can affect you for several hours. Do not drive or operate machinery until your health care provider says that it is safe.  Get help right away if  you have a lot of blood in your stool, nausea or vomiting, a fever, or increased pain in your abdomen. This information is not intended to replace advice given to you by your health care provider. Make sure you discuss any questions you have with your health care provider. Document Revised: 12/10/2019 Document Reviewed: 07/12/2019 Elsevier Patient Education  2021 [place] After This sheet gives you information about how to care for yourself after your procedure. Your health care provider may also give you more specific instructions. If you have problems or questions, contact your health care provider. What can I expect after the procedure? After the procedure, it is common to have:  Tiredness.  Forgetfulness about what happened after the procedure.  Impaired judgment for important decisions.  Nausea or vomiting.  Some difficulty with balance. Follow these instructions at home: For the time period you were told by your health care provider:  Rest as needed.  Do not participate in activities where you could fall or become injured.  Do not drive or use machinery.  Do not drink  alcohol.  Do not take sleeping pills or medicines that cause drowsiness.  Do not make important decisions or sign legal documents.  Do not take care of children on your own.      Eating and drinking  Follow the diet that is recommended by your health care provider.  Drink enough fluid to keep your urine pale yellow.  If you vomit: ? Drink water, juice, or soup when you can drink without vomiting. ? Make sure you have little or no nausea before eating solid foods. General instructions  Have a responsible adult stay with you for the time you are told. It is important to have someone help care for you until you are awake and alert.  Take over-the-counter and prescription medicines only as told by your health care provider.  If you have sleep apnea, surgery and certain medicines can increase your risk for breathing problems. Follow instructions from your health care provider about wearing your sleep device: ? Anytime you are sleeping, including during daytime naps. ? While taking prescription pain medicines, sleeping medicines, or medicines that make you drowsy.  Avoid smoking.  Keep all follow-up visits as told by your health care provider. This is important. Contact a health care provider if:  You keep feeling nauseous or you keep vomiting.  You feel light-headed.  You are still sleepy or having trouble with balance after 24 hours.  You develop a rash.  You have a fever.  You have redness or swelling around the IV site. Get help right away if:  You have trouble breathing.  You have new-onset confusion at home. Summary  For several hours after your procedure, you may feel tired. You may also be forgetful and have poor judgment.  Have a responsible adult stay with you for the time you are told. It is important to have someone help care for you until you are awake and alert.  Rest as told. Do not drive or operate machinery. Do not drink alcohol or take sleeping  pills.  Get help right away if you have trouble breathing, or if you suddenly become confused. This information is not intended to replace advice given to you by your health care provider. Make sure you discuss any questions you have with your health care provider. Document Revised: 08/31/2020 Document Reviewed: 11/18/2019 Elsevier Patient Education  2021 [employer].

## 2021-01-09 NOTE — Telephone Encounter (Signed)
Xarelto can be held 3 days prior to surgery.  Aspirin can be held 5 to 7 days prior to

## 2021-01-09 NOTE — Telephone Encounter (Signed)
Hi Darius Bump,   Can you please reach his cardiology team to ask for recs to stop his Xarelto 48 h prior to the procedure? The person that prescribed it was Levell July, he works with [employer]. Please add Maudie Mercury in the communications so she can be kept on the loop.   Thanks    Maylon Peppers, MD  Gastroenterology and Hepatology  [facility]

## 2021-01-10 ENCOUNTER — Encounter (HOSPITAL_COMMUNITY)
Admission: RE | Admit: 2021-01-10 | Discharge: 2021-01-10 | Disposition: A | Discharge status: Home / Self Care | Payer: Medicare Other | Source: Ambulatory Visit | Attending: Gastroenterology | Admitting: Gastroenterology

## 2021-01-10 ENCOUNTER — Encounter (HOSPITAL_COMMUNITY): Payer: Self-pay

## 2021-01-10 ENCOUNTER — Other Ambulatory Visit (HOSPITAL_COMMUNITY): Payer: Medicare Other

## 2021-01-12 ENCOUNTER — Encounter (HOSPITAL_COMMUNITY): Admission: RE | Payer: Self-pay | Source: Home / Self Care

## 2021-01-12 ENCOUNTER — Ambulatory Visit (HOSPITAL_COMMUNITY): Admission: RE | Admit: 2021-01-12 | Payer: Medicare Other | Source: Home / Self Care | Admitting: Gastroenterology

## 2021-01-12 SURGERY — COLONOSCOPY WITH PROPOFOL
Anesthesia: Monitor Anesthesia Care

## 2021-01-25 DIAGNOSIS — I255 Ischemic cardiomyopathy: Secondary | ICD-10-CM | POA: Diagnosis not present

## 2021-01-25 DIAGNOSIS — I5022 Chronic systolic (congestive) heart failure: Secondary | ICD-10-CM | POA: Diagnosis not present

## 2021-01-25 DIAGNOSIS — I272 Pulmonary hypertension, unspecified: Secondary | ICD-10-CM | POA: Diagnosis not present

## 2021-01-25 DIAGNOSIS — I081 Rheumatic disorders of both mitral and tricuspid valves: Secondary | ICD-10-CM | POA: Diagnosis not present

## 2021-01-26 DIAGNOSIS — I2510 Atherosclerotic heart disease of native coronary artery without angina pectoris: Secondary | ICD-10-CM | POA: Diagnosis not present

## 2021-01-26 DIAGNOSIS — I517 Cardiomegaly: Secondary | ICD-10-CM | POA: Diagnosis not present

## 2021-01-26 DIAGNOSIS — I509 Heart failure, unspecified: Secondary | ICD-10-CM | POA: Diagnosis not present

## 2021-01-26 DIAGNOSIS — Z955 Presence of coronary angioplasty implant and graft: Secondary | ICD-10-CM | POA: Diagnosis not present

## 2021-01-26 DIAGNOSIS — I5022 Chronic systolic (congestive) heart failure: Secondary | ICD-10-CM | POA: Diagnosis not present

## 2021-01-26 DIAGNOSIS — Z951 Presence of aortocoronary bypass graft: Secondary | ICD-10-CM | POA: Diagnosis not present

## 2021-01-29 DIAGNOSIS — E039 Hypothyroidism, unspecified: Secondary | ICD-10-CM | POA: Diagnosis not present

## 2021-01-29 DIAGNOSIS — I5022 Chronic systolic (congestive) heart failure: Secondary | ICD-10-CM | POA: Diagnosis not present

## 2021-01-29 DIAGNOSIS — K219 Gastro-esophageal reflux disease without esophagitis: Secondary | ICD-10-CM | POA: Diagnosis not present

## 2021-01-29 DIAGNOSIS — K581 Irritable bowel syndrome with constipation: Secondary | ICD-10-CM | POA: Diagnosis not present

## 2021-01-29 DIAGNOSIS — I5033 Acute on chronic diastolic (congestive) heart failure: Secondary | ICD-10-CM | POA: Diagnosis not present

## 2021-01-30 DIAGNOSIS — I5033 Acute on chronic diastolic (congestive) heart failure: Secondary | ICD-10-CM | POA: Diagnosis not present

## 2021-01-30 DIAGNOSIS — E039 Hypothyroidism, unspecified: Secondary | ICD-10-CM | POA: Diagnosis not present

## 2021-01-30 DIAGNOSIS — K581 Irritable bowel syndrome with constipation: Secondary | ICD-10-CM | POA: Diagnosis not present

## 2021-01-30 DIAGNOSIS — K219 Gastro-esophageal reflux disease without esophagitis: Secondary | ICD-10-CM | POA: Diagnosis not present

## 2021-01-30 DIAGNOSIS — J40 Bronchitis, not specified as acute or chronic: Secondary | ICD-10-CM | POA: Diagnosis not present

## 2021-02-05 ENCOUNTER — Encounter (INDEPENDENT_AMBULATORY_CARE_PROVIDER_SITE_OTHER): Payer: Self-pay

## 2021-02-08 DIAGNOSIS — I2510 Atherosclerotic heart disease of native coronary artery without angina pectoris: Secondary | ICD-10-CM | POA: Diagnosis not present

## 2021-02-08 DIAGNOSIS — E785 Hyperlipidemia, unspecified: Secondary | ICD-10-CM | POA: Diagnosis not present

## 2021-02-08 DIAGNOSIS — I10 Essential (primary) hypertension: Secondary | ICD-10-CM | POA: Diagnosis not present

## 2021-02-08 DIAGNOSIS — I5022 Chronic systolic (congestive) heart failure: Secondary | ICD-10-CM | POA: Diagnosis not present

## 2021-03-05 ENCOUNTER — Ambulatory Visit (INDEPENDENT_AMBULATORY_CARE_PROVIDER_SITE_OTHER): Payer: Medicare Other | Admitting: Gastroenterology

## 2021-03-16 ENCOUNTER — Ambulatory Visit: Payer: Medicare Other | Admitting: Cardiology

## 2021-04-05 ENCOUNTER — Ambulatory Visit (INDEPENDENT_AMBULATORY_CARE_PROVIDER_SITE_OTHER): Payer: Medicare Other | Admitting: Urology

## 2021-04-05 ENCOUNTER — Other Ambulatory Visit: Payer: Self-pay

## 2021-04-05 VITALS — BP 146/91 | HR 71 | Temp 97.8°F | Ht 64.0 in | Wt 153.1 lb

## 2021-04-05 DIAGNOSIS — R351 Nocturia: Secondary | ICD-10-CM | POA: Diagnosis not present

## 2021-04-05 DIAGNOSIS — N138 Other obstructive and reflux uropathy: Secondary | ICD-10-CM

## 2021-04-05 DIAGNOSIS — N390 Urinary tract infection, site not specified: Secondary | ICD-10-CM

## 2021-04-05 DIAGNOSIS — N401 Enlarged prostate with lower urinary tract symptoms: Secondary | ICD-10-CM | POA: Diagnosis not present

## 2021-04-05 DIAGNOSIS — N5201 Erectile dysfunction due to arterial insufficiency: Secondary | ICD-10-CM | POA: Diagnosis not present

## 2021-04-05 LAB — MICROSCOPIC EXAMINATION
Bacteria, UA: NONE SEEN
Renal Epithel, UA: NONE SEEN /hpf
WBC, UA: NONE SEEN /hpf (ref 0–5)

## 2021-04-05 LAB — URINALYSIS, ROUTINE W REFLEX MICROSCOPIC
Bilirubin, UA: NEGATIVE
Glucose, UA: NEGATIVE
Ketones, UA: NEGATIVE
Leukocytes,UA: NEGATIVE
Nitrite, UA: NEGATIVE
Protein,UA: NEGATIVE
Specific Gravity, UA: 1.015 (ref 1.005–1.030)
Urobilinogen, Ur: 0.2 mg/dL (ref 0.2–1.0)
pH, UA: 6 (ref 5.0–7.5)

## 2021-04-05 MED ORDER — SILDENAFIL CITRATE 20 MG PO TABS: 20.0000 mg | ORAL_TABLET | Freq: Every day | ORAL | 3 refills | Status: DC | PRN

## 2021-04-05 MED ORDER — TAMSULOSIN HCL 0.4 MG PO CAPS
0.4000 mg | ORAL_CAPSULE | Freq: Every day | ORAL | 3 refills | Status: DC
Start: 2021-04-05 — End: 2022-03-28

## 2021-04-05 NOTE — Progress Notes (Signed)
Urological Symptom Review  Patient is experiencing the following symptoms: Negative   Review of Systems  Gastrointestinal (upper)  : Negative for upper GI symptoms  Gastrointestinal (lower) : Negative for lower GI symptoms  Constitutional : Negative for symptoms  Skin: Itching  Eyes: Double vision  Ear/Nose/Throat : Negative for Ear/Nose/Throat symptoms  Hematologic/Lymphatic: Negative for Hematologic/Lymphatic symptoms  Cardiovascular : Negative for cardiovascular symptoms  Respiratory : Negative for respiratory symptoms  Endocrine: Negative for endocrine symptoms  Musculoskeletal: Back pain Joint pain  Neurological: Negative for neurological symptoms  Psychologic: Depression Anxiety

## 2021-04-05 NOTE — Progress Notes (Signed)
Subjective:  1. Recurrent UTI   2. BPH with urinary obstruction   3. Nocturia   4. Erectile dysfunction due to arterial insufficiency      I am having trouble with my erections.   He has had progressive ED. He is using the sildenafil with success.   He is only using 51m.  He carries NTG but doesn't use it and knows to avoid it.    He has BPH with BOO and is doing well on tamsulosin.  His IPSS is 4.   He also has a history of recurrent pyelonephritis.   He remains on  a cranberry supplement and has done well.   His UA is unremarkable. His PSA was 0.9 last year.   [facility]   08/08/17: GU Hx: Mr. BFedieis a 70yo male who is sent in consultation by Dr. HSherrie Sportfor recurrent pyelonephritis. He had right pyelonephritis in May with a multidrug resistant e. coli that was initially treated with a cefalosporin followed by macrobid but he required hospitalization for IV antibiotics. He did well but had recurrent symptoms and [facility]. He had flushing and hypertension. He had dysuria on admission. He was discharged today. He was found to have e. coli on blood cultures and is currentlly on parenteral antibiotics for another 5 days. He had a CT in May 2018 that showed no stones or obstruction.     IPSS    Row Name 04/05/21 1400         International Prostate Symptom Score   How often have you had the sensation of not emptying your bladder? Not at All     How often have you had to urinate less than every two hours? Less than 1 in 5 times     How often have you found you stopped and started again several times when you urinated? Not at All     How often have you found it difficult to postpone urination? Less than 1 in 5 times     How often have you had a weak urinary stream? Not at All     How often have you had to strain to start urination? Not at All     How  many times did you typically get up at night to urinate? 2 Times     Total IPSS Score 4           Quality of Life due to urinary symptoms   If you were to spend the rest of your life with your urinary condition just the way it is now how would you feel about that? Pleased             ROS:  ROS:  A complete review of systems was performed.  All systems are negative except for pertinent findings as noted.   Review of Systems  All other systems reviewed and are negative.   Allergies  Allergen Reactions  . Lipitor [Atorvastatin] Other (See Comments)    Myalgias  . Lisinopril Other (See Comments)    Angioedema  . Peanut-Containing Drug Products Itching  . Shellfish-Derived Products Itching and Rash    Outpatient Encounter Medications as of 04/05/2021  Medication Sig Note  . acetaminophen (TYLENOL) 500 MG tablet Take 1,000 mg by mouth every 6 (six) hours as needed (pain/fever).   . ALPRAZolam (XANAX) 0.5 MG tablet  Take 0.25-0.5 mg by mouth at bedtime as needed for sleep.   . Ascorbic Acid (VITAMIN C) 1000 MG tablet Take 1,000 mg by mouth daily.   Marland Kitchen aspirin 81 MG EC tablet Take 81 mg by mouth every morning.   . Calcium Carb-Cholecalciferol (CALCIUM 600+D) 600-800 MG-UNIT TABS Take 1 tablet by mouth every other day.   . Cholecalciferol (VITAMIN D3) 1000 units CAPS Take 1,000 Units by mouth daily.   . Cranberry 500 MG TABS Take 500 mg by mouth every morning.   . dicyclomine (BENTYL) 20 MG tablet Take 20 mg by mouth 2 (two) times daily as needed for spasms.   Marland Kitchen esomeprazole (NEXIUM) 20 MG capsule Take 20 mg by mouth daily as needed (acid reflux.).   Marland Kitchen famotidine (PEPCID) 20 MG tablet Take 20 mg by mouth daily.   . FEROSUL 325 (65 Fe) MG tablet Take 325 mg by mouth 3 (three) times daily.   . folic acid (FOLVITE) 993 MCG tablet Take 800 mcg by mouth daily.   . furosemide (LASIX) 20 MG tablet Take 20 mg by mouth daily.   Marland Kitchen levothyroxine (SYNTHROID, LEVOTHROID) 100 MCG tablet Take 100  mcg by mouth daily before breakfast.   . loratadine (CLARITIN) 10 MG tablet Take 10 mg by mouth daily as needed for allergies.   . metoprolol succinate (TOPROL-XL) 25 MG 24 hr tablet Take 1 tablet (25 mg total) by mouth 2 (two) times daily.   . Multiple Vitamins-Minerals (OCUVITE-LUTEIN PO) Take 1 tablet by mouth 3 (three) times a week.   . nitroGLYCERIN (NITROSTAT) 0.4 MG SL tablet Place 1 tablet (0.4 mg total) under the tongue every 5 (five) minutes as needed for chest pain. (Patient taking differently: Place 0.4 mg under the tongue every 5 (five) minutes x 3 doses as needed for chest pain.)   . ondansetron (ZOFRAN) 4 MG tablet Take 4 mg by mouth daily as needed for nausea.   Vladimir Faster Glycol-Propyl Glycol (LUBRICANT EYE DROPS) 0.4-0.3 % SOLN Place 1 drop into both eyes 3 (three) times daily as needed (dry/irritated eyes.).   Marland Kitchen polyethylene glycol (MIRALAX / GLYCOLAX) 17 g packet Take 17 g by mouth at bedtime.   . potassium chloride SA (KLOR-CON) 20 MEQ tablet Take 20 mEq by mouth daily.   . rivaroxaban (XARELTO) 2.5 MG TABS tablet Take 1 tablet (2.5 mg total) by mouth 2 (two) times daily.   . rosuvastatin (CRESTOR) 20 MG tablet Take 1 tablet (20 mg total) by mouth daily. (Patient taking differently: Take 20 mg by mouth every evening.)   . SUPREP BOWEL PREP KIT 17.5-3.13-1.6 GM/177ML SOLN Take 354 mLs by mouth as directed. 01/05/2021: Before colonoscopy  . vitamin E 200 UNIT capsule Take 200 Units by mouth daily.   . [DISCONTINUED] sildenafil (REVATIO) 20 MG tablet Take 1 tablet (20 mg total) by mouth daily as needed. (Patient taking differently: Take 20 mg by mouth daily as needed (erectile dysfunction.).)   . [DISCONTINUED] tamsulosin (FLOMAX) 0.4 MG CAPS capsule Take 1 capsule (0.4 mg total) by mouth daily.   . sildenafil (REVATIO) 20 MG tablet Take 1 tablet (20 mg total) by mouth daily as needed.   . tamsulosin (FLOMAX) 0.4 MG CAPS capsule Take 1 capsule (0.4 mg total) by mouth daily.    No  facility-administered encounter medications on file as of 04/05/2021.    Past Medical History:  Diagnosis Date  . Arthritis   . Dyslipidemia   . GERD (gastroesophageal reflux disease)   .  H/O: CVA (cerebrovascular accident), 06/22/15 Rt frontal rec'd TPA 08/14/2015  . Heart attack (HCC) 09/24/94  . Heart disease, unspecified   . Hypertension   . Hypothyroidism   . Hypothyroidism   . Irritable bowel syndrome   . Mitral valve disorders(424.0)   . Obesity   . Pulmonary valve disorders   . Stroke [facility])     Past Surgical History:  Procedure Laterality Date  . CARDIAC CATHETERIZATION  12/10/94  . CARDIAC CATHETERIZATION N/A 08/14/2015   Procedure: Left Heart Cath and Coronary Angiography;  Surgeon: Tonny Bollman, MD;  Location[facility] CV LAB;  Service: Cardiovascular;  Laterality: N/A;  . CARDIAC CATHETERIZATION N/A 11/24/2015   Procedure: Left Heart Cath and Cors/Grafts Angiography;  Surgeon: Lennette Bihari, MD;  Location: [facility] INVASIVE CV LAB;  Service: Cardiovascular;  Laterality: N/A;  . CARDIAC CATHETERIZATION  11/24/2015   Procedure: Coronary Stent Intervention;  Surgeon: Lennette Bihari, MD;  Location: [facility] INVASIVE CV LAB;  Service: Cardiovascular;;  . CORONARY ARTERY BYPASS GRAFT N/A 08/18/2015   Procedure: CORONARY ARTERY BYPASS GRAFTING (CABG)  X 5 UTILIZING THE LEFT INTERNAL MAMMARY ARTERY AND ENDOSCOPICALLY HARVESTED RIGHT AND LEFT SAPHENEOUS VEINS.;  Surgeon: Loreli Slot, MD;  Location: [facility] OR;  Service: Open Heart Surgery;  Laterality: N/A;  . HERNIA REPAIR     age 70  . TEE WITHOUT CARDIOVERSION N/A 08/18/2015   Procedure: TRANSESOPHAGEAL ECHOCARDIOGRAM (TEE);  Surgeon: Loreli Slot, MD;  Location[facility] OR;  Service: Open Heart Surgery;  Laterality: N/A;    Social History   Socioeconomic History  . Marital status: Married    Spouse name: Not on file  . Number of children: Not on file  . Years of education: Not on file  . Highest education level: Not on  file  Occupational History  . Not on file  Tobacco Use  . Smoking status: Never Smoker  . Smokeless tobacco: Never Used  Vaping Use  . Vaping Use: Never used  Substance and Sexual Activity  . Alcohol use: No    Alcohol/week: 0.0 standard drinks  . Drug use: No  . Sexual activity: Yes    Partners: Female  Other Topics Concern  . Not on file  Social History Narrative   Retired   Has 3 children   Married to [employer]   Regular exercise--yes 5 days/week   Social Determinants of Corporate investment banker Strain: Not on file  Food Insecurity: Not on file  Transportation Needs: Not on file  Physical Activity: Not on file  Stress: Not on file  Social Connections: Not on file  Intimate Partner Violence: Not on file    Family History  Problem Relation Age of Onset  . Kidney disease Father        died age 43  . Heart disease Mother        had angioplasty       Objective: BP (!) 146/91   Pulse 71   Temp 97.8 F (36.6 C) (Oral)   Ht 5\' 4"  (1.626 m)   Wt 153 lb 2 oz (69.5 kg)   BMI 26.28 kg/m    Physical Exam Vitals reviewed.  Constitutional:      Appearance: Normal appearance.  Neurological:     Mental Status: He is alert.     Lab Results:  Results for orders placed or performed in visit on 04/05/21 (from the past 24 hour(s))  Urinalysis, Routine w reflex microscopic     Status:  Abnormal   Collection Time: 04/05/21  2:12 PM  Result Value Ref Range   Specific Gravity, UA 1.015 1.005 - 1.030   pH, UA 6.0 5.0 - 7.5   Color, UA Yellow Yellow   Appearance Ur Clear Clear   Leukocytes,UA Negative Negative   Protein,UA Negative Negative/Trace   Glucose, UA Negative Negative   Ketones, UA Negative Negative   RBC, UA Trace (A) Negative   Bilirubin, UA Negative Negative   Urobilinogen, Ur 0.2 0.2 - 1.0 mg/dL   Nitrite, UA Negative Negative   Microscopic Examination See below:    Narrative   Performed at:  [facility] [address] [place], [place]  [id number] Lab Director: Mina Marble [place], Phone:  [phone]  Microscopic Examination     Status: None   Collection Time: 04/05/21  2:12 PM   Urine  Result Value Ref Range   WBC, UA None seen 0 - 5 /hpf   RBC 0-2 0 - 2 /hpf   Epithelial Cells (non renal) 0-10 0 - 10 /hpf   Renal Epithel, UA None seen None seen /hpf   Bacteria, UA None seen None seen/Few   Narrative   Performed at:  [facility] [address], [place], [place]  [id number] Lab Director: [place], Phone:  [phone]    BMET No results for input(s): NA, K, CL, CO2, GLUCOSE, BUN, CREATININE, CALCIUM in the last 72 hours. PSA No results found for: PSA No results found for: TESTOSTERONE  UA is unremarkable.   Studies/Results: No results found.    Assessment & Plan: History of recurrent UTI's but is doing well without recurrences.  BPH and BOO.  I have refilled the tamsulosin.  ED.  I have refilled the sildenafil.     Meds ordered this encounter  Medications  . sildenafil (REVATIO) 20 MG tablet    Sig: Take 1 tablet (20 mg total) by mouth daily as needed.    Dispense:  90 tablet    Refill:  3  . tamsulosin (FLOMAX) 0.4 MG CAPS capsule    Sig: Take 1 capsule (0.4 mg total) by mouth daily.    Dispense:  90 capsule    Refill:  3     Orders Placed This Encounter  Procedures  . Microscopic Examination  . Urinalysis, Routine w reflex microscopic      Return in about 1 year (around 04/05/2022).   CC: Neale Burly, MD      Irine Seal 04/05/2021

## 2021-04-06 ENCOUNTER — Ambulatory Visit: Payer: Medicare Other | Admitting: Urology

## 2021-04-26 DIAGNOSIS — Z8673 Personal history of transient ischemic attack (TIA), and cerebral infarction without residual deficits: Secondary | ICD-10-CM | POA: Diagnosis not present

## 2021-04-26 DIAGNOSIS — Z79899 Other long term (current) drug therapy: Secondary | ICD-10-CM | POA: Diagnosis not present

## 2021-04-26 DIAGNOSIS — R9431 Abnormal electrocardiogram [ECG] [EKG]: Secondary | ICD-10-CM | POA: Diagnosis not present

## 2021-04-26 DIAGNOSIS — E785 Hyperlipidemia, unspecified: Secondary | ICD-10-CM | POA: Diagnosis not present

## 2021-04-26 DIAGNOSIS — E079 Disorder of thyroid, unspecified: Secondary | ICD-10-CM | POA: Diagnosis not present

## 2021-04-26 DIAGNOSIS — R112 Nausea with vomiting, unspecified: Secondary | ICD-10-CM | POA: Diagnosis not present

## 2021-04-26 DIAGNOSIS — I2510 Atherosclerotic heart disease of native coronary artery without angina pectoris: Secondary | ICD-10-CM | POA: Diagnosis not present

## 2021-04-26 DIAGNOSIS — K5730 Diverticulosis of large intestine without perforation or abscess without bleeding: Secondary | ICD-10-CM | POA: Diagnosis not present

## 2021-04-26 DIAGNOSIS — I10 Essential (primary) hypertension: Secondary | ICD-10-CM | POA: Diagnosis not present

## 2021-04-26 DIAGNOSIS — R109 Unspecified abdominal pain: Secondary | ICD-10-CM | POA: Diagnosis not present

## 2021-04-26 DIAGNOSIS — Z7901 Long term (current) use of anticoagulants: Secondary | ICD-10-CM | POA: Diagnosis not present

## 2021-04-26 DIAGNOSIS — I252 Old myocardial infarction: Secondary | ICD-10-CM | POA: Diagnosis not present

## 2021-04-26 DIAGNOSIS — Z7982 Long term (current) use of aspirin: Secondary | ICD-10-CM | POA: Diagnosis not present

## 2021-04-26 DIAGNOSIS — K219 Gastro-esophageal reflux disease without esophagitis: Secondary | ICD-10-CM | POA: Diagnosis not present

## 2021-05-10 DIAGNOSIS — E785 Hyperlipidemia, unspecified: Secondary | ICD-10-CM | POA: Diagnosis not present

## 2021-05-10 DIAGNOSIS — I10 Essential (primary) hypertension: Secondary | ICD-10-CM | POA: Diagnosis not present

## 2021-05-10 DIAGNOSIS — K219 Gastro-esophageal reflux disease without esophagitis: Secondary | ICD-10-CM | POA: Diagnosis not present

## 2021-05-10 DIAGNOSIS — I5022 Chronic systolic (congestive) heart failure: Secondary | ICD-10-CM | POA: Diagnosis not present

## 2021-05-10 DIAGNOSIS — I2510 Atherosclerotic heart disease of native coronary artery without angina pectoris: Secondary | ICD-10-CM | POA: Diagnosis not present

## 2021-05-10 DIAGNOSIS — K581 Irritable bowel syndrome with constipation: Secondary | ICD-10-CM | POA: Diagnosis not present

## 2021-05-10 DIAGNOSIS — E039 Hypothyroidism, unspecified: Secondary | ICD-10-CM | POA: Diagnosis not present

## 2021-05-10 DIAGNOSIS — I5033 Acute on chronic diastolic (congestive) heart failure: Secondary | ICD-10-CM | POA: Diagnosis not present

## 2021-05-11 DIAGNOSIS — K219 Gastro-esophageal reflux disease without esophagitis: Secondary | ICD-10-CM | POA: Diagnosis not present

## 2021-05-11 DIAGNOSIS — I5033 Acute on chronic diastolic (congestive) heart failure: Secondary | ICD-10-CM | POA: Diagnosis not present

## 2021-05-11 DIAGNOSIS — E039 Hypothyroidism, unspecified: Secondary | ICD-10-CM | POA: Diagnosis not present

## 2021-05-11 DIAGNOSIS — K581 Irritable bowel syndrome with constipation: Secondary | ICD-10-CM | POA: Diagnosis not present

## 2021-05-21 ENCOUNTER — Telehealth (INDEPENDENT_AMBULATORY_CARE_PROVIDER_SITE_OTHER): Payer: Medicare Other | Admitting: Gastroenterology

## 2021-05-21 ENCOUNTER — Other Ambulatory Visit: Payer: Self-pay

## 2021-05-21 ENCOUNTER — Encounter (INDEPENDENT_AMBULATORY_CARE_PROVIDER_SITE_OTHER): Payer: Self-pay | Admitting: Gastroenterology

## 2021-05-21 DIAGNOSIS — D509 Iron deficiency anemia, unspecified: Secondary | ICD-10-CM | POA: Insufficient documentation

## 2021-05-21 NOTE — Patient Instructions (Signed)
Schedule EGD and colonoscopy, will need to stop Eliquis 2 days prior will obtain clearance for this Continue oral iron

## 2021-05-21 NOTE — Progress Notes (Signed)
Maylon Peppers, M.D. Gastroenterology & Hepatology [facility] [address] [place], [place] [address] Primary Care Physician: Neale Burly, MD [place] [address]  This is a telephone virtual visit.  It required patient-provider interaction for the medical decision making as documented below. The patient has consented and agreed to proceed with a Telehealth encounter given the current Coronavirus pandemic.  VIRTUAL VISIT NOTE Patient location: home Provider location: home  I will communicate my assessment and recommendations to the referring MD via EMR.  Problems: 1. IDA 2. Small bowel ganglioneuroma  S/p SB resection  History of Present Illness: Adam Shelton is a 70 y.o. male from [place] with PMH CAD, h/o stroke on Xarelto, HTN, hx of SBO, systolic HF and small bowel ganglioneuroma  S/p SB resection, coming to the clinic for follow-up of iron deficiency anemia.  Patient was last seen on 11/30/2020.  He was scheduled for an EGD and a colonoscopy to evaluate his iron deficiency anemia.  Had stool testing that was negative for ova and parasites. Other blood testing at that time showed normal hemoglobin of 13.9 and MCV of 76.9 with low ferritin of 22, saturation of 7% and iron of 25, normal CMP.  Unfortunately, his colonoscopy and EGD had to be canceled as the patient was presenting worsening heart failure and he was recommended to be evaluated by his cardiologist before this procedures were performed.  The patient states that he has been feeling well.  Patient reports he has occasional abdominal pain when he eats fish but denies having any other complaints at the moment. Denies having any other symptoms. The patient denies having any nausea, vomiting, fever, chills, hematochezia, melena, hematemesis, abdominal distention, abdominal pain, diarrhea, jaundice, pruritus or weight loss.Patient is taking iron tablets  twice a week. He is not taking it every day causes constipation so he would like to avoid it, Most recent labs from 05/14/2021 showed his most recent Hb was 14.1, but his ferritin was 27, iron 76, iron sat 20% which were normal.  Patient was seen by his cardiologist Dr. Candis Musa on 02/08/2021 and he was cleared to undergo EGD/colonoscopy.  Last EGD: never Last Colonoscopy:never  Past Medical History: Past Medical History:  Diagnosis Date  . Arthritis   . Dyslipidemia   . GERD (gastroesophageal reflux disease)   . H/O: CVA (cerebrovascular accident), 06/22/15 Rt frontal rec'd TPA 08/14/2015  . Heart attack ([place]) 09/24/94  . Heart disease, unspecified   . Hypertension   . Hypothyroidism   . Hypothyroidism   . Irritable bowel syndrome   . Mitral valve disorders(424.0)   . Obesity   . Pulmonary valve disorders   . Stroke [facility])     Past Surgical History: Past Surgical History:  Procedure Laterality Date  . CARDIAC CATHETERIZATION  12/10/94  . CARDIAC CATHETERIZATION N/A 08/14/2015   Procedure: Left Heart Cath and Coronary Angiography;  Surgeon: Sherren Mocha, MD;  Location: [place] CV [facility];  Service: Cardiovascular;  Laterality: N/A;  . CARDIAC CATHETERIZATION N/A 11/24/2015   Procedure: Left Heart Cath and Cors/Grafts Angiography;  Surgeon: Troy Sine, MD;  Location: [place] [facility];  Service: Cardiovascular;  Laterality: N/A;  . CARDIAC CATHETERIZATION  11/24/2015   Procedure: Coronary Stent Intervention;  Surgeon: Troy Sine, MD;  Location: [place] [facility];  Service: Cardiovascular;;  . CORONARY ARTERY BYPASS GRAFT N/A 08/18/2015   Procedure: CORONARY ARTERY BYPASS GRAFTING (CABG)  X 5 UTILIZING THE  LEFT INTERNAL MAMMARY ARTERY AND ENDOSCOPICALLY HARVESTED RIGHT AND LEFT SAPHENEOUS VEINS.;  Surgeon: Melrose Nakayama, MD;  Location: [place];  Service: Open Heart Surgery;  Laterality: N/A;  . HERNIA REPAIR     age 72  . TEE WITHOUT CARDIOVERSION N/A 08/18/2015    Procedure: TRANSESOPHAGEAL ECHOCARDIOGRAM (TEE);  Surgeon: Melrose Nakayama, MD;  Location: [place];  Service: Open Heart Surgery;  Laterality: N/A;    Family History: Family History  Problem Relation Age of Onset  . Kidney disease Father        died age 31  . Heart disease Mother        had angioplasty    Social History: Social History   Tobacco Use  Smoking Status Never Smoker  Smokeless Tobacco Never Used   Social History   Substance and Sexual Activity  Alcohol Use No  . Alcohol/week: 0.0 standard drinks   Social History   Substance and Sexual Activity  Drug Use No    Allergies: Allergies  Allergen Reactions  . Lipitor [Atorvastatin] Other (See Comments)    Myalgias  . Lisinopril Other (See Comments)    Angioedema  . Peanut-Containing Drug Products Itching  . Shellfish-Derived Products Itching and Rash    Medications: Current Outpatient Medications  Medication Sig Dispense Refill  . acetaminophen (TYLENOL) 500 MG tablet Take 1,000 mg by mouth every 6 (six) hours as needed (pain/fever).    . ALPRAZolam (XANAX) 0.5 MG tablet Take 0.25-0.5 mg by mouth at bedtime as needed for sleep.  0  . Ascorbic Acid (VITAMIN C) 1000 MG tablet Take 1,000 mg by mouth daily.    Marland Kitchen aspirin 81 MG EC tablet Take 81 mg by mouth every morning.    . Calcium Carb-Cholecalciferol (CALCIUM 600+D) 600-800 MG-UNIT TABS Take 1 tablet by mouth every other day.    . Cholecalciferol (VITAMIN D3) 1000 units CAPS Take 1,000 Units by mouth daily.    . Cranberry 500 MG TABS Take 500 mg by mouth every morning.    . dicyclomine (BENTYL) 20 MG tablet Take 20 mg by mouth 2 (two) times daily as needed for spasms.    Marland Kitchen esomeprazole (NEXIUM) 20 MG capsule Take 20 mg by mouth daily as needed (acid reflux.).    Marland Kitchen famotidine (PEPCID) 20 MG tablet Take 20 mg by mouth daily.    . FEROSUL 325 (65 Fe) MG tablet Take 325 mg by mouth 2 (two) times a week.    . folic acid (FOLVITE) 622 MCG tablet Take 800 mcg  by mouth daily.    . furosemide (LASIX) 20 MG tablet Take 20 mg by mouth daily.    Marland Kitchen levothyroxine (SYNTHROID, LEVOTHROID) 100 MCG tablet Take 100 mcg by mouth daily before breakfast.    . loratadine (CLARITIN) 10 MG tablet Take 10 mg by mouth daily as needed for allergies.    . metoprolol succinate (TOPROL-XL) 25 MG 24 hr tablet Take 1 tablet (25 mg total) by mouth 2 (two) times daily. 180 tablet 3  . Multiple Vitamins-Minerals (OCUVITE-LUTEIN PO) Take 1 tablet by mouth 3 (three) times a week.    . nitroGLYCERIN (NITROSTAT) 0.4 MG SL tablet Place 1 tablet (0.4 mg total) under the tongue every 5 (five) minutes as needed for chest pain. (Patient taking differently: Place 0.4 mg under the tongue every 5 (five) minutes x 3 doses as needed for chest pain.) 25 tablet 3  . ondansetron (ZOFRAN) 4 MG tablet Take 4 mg by mouth daily as needed  for nausea.    Vladimir Faster Glycol-Propyl Glycol (LUBRICANT EYE DROPS) 0.4-0.3 % SOLN Place 1 drop into both eyes 3 (three) times daily as needed (dry/irritated eyes.).    Marland Kitchen polyethylene glycol (MIRALAX / GLYCOLAX) 17 g packet Take 17 g by mouth at bedtime.    . potassium chloride SA (KLOR-CON) 20 MEQ tablet Take 20 mEq by mouth daily.    . rivaroxaban (XARELTO) 2.5 MG TABS tablet Take 1 tablet (2.5 mg total) by mouth 2 (two) times daily. 180 tablet 1  . rosuvastatin (CRESTOR) 20 MG tablet Take 1 tablet (20 mg total) by mouth daily. (Patient taking differently: Take 20 mg by mouth every evening.) 90 tablet 1  . sildenafil (REVATIO) 20 MG tablet Take 1 tablet (20 mg total) by mouth daily as needed. 90 tablet 3  . SUPREP BOWEL PREP KIT 17.5-3.13-1.6 GM/177ML SOLN Take 354 mLs by mouth as directed.    . tamsulosin (FLOMAX) 0.4 MG CAPS capsule Take 1 capsule (0.4 mg total) by mouth daily. 90 capsule 3  . vitamin E 200 UNIT capsule Take 200 Units by mouth daily.     No current facility-administered medications for this visit.    Review of Systems: GENERAL: negative for  malaise, night sweats HEENT: No changes in hearing or vision, no nose bleeds or other nasal problems. NECK: Negative for lumps, goiter, pain and significant neck swelling RESPIRATORY: Negative for cough, wheezing CARDIOVASCULAR: Negative for chest pain, leg swelling, palpitations, orthopnea GI: SEE HPI MUSCULOSKELETAL: Negative for joint pain or swelling, back pain, and muscle pain. SKIN: Negative for lesions, rash PSYCH: Negative for sleep disturbance, mood disorder and recent psychosocial stressors. HEMATOLOGY Negative for prolonged bleeding, bruising easily, and swollen nodes. ENDOCRINE: Negative for cold or heat intolerance, polyuria, polydipsia and goiter. NEURO: negative for tremor, gait imbalance, syncope and seizures. The remainder of the review of systems is noncontributory.   Physical Exam: No exam was performed as this was a telephone encounter  Imaging/Labs: as above  I personally reviewed and interpreted the available labs, imaging and endoscopic files.  Impression and Plan: Adam Shelton is a 70 y.o. male from [place] with PMH CAD, h/o stroke on Xarelto, HTN, hx of SBO, systolic HF and small bowel ganglioneuroma  S/p SB resection, coming to the clinic for follow-up of iron deficiency anemia.  The patient had presence of iron deficiency anemia that has responded to oral iron intake but still is presenting some low iron stores.  He has never had any endoscopic investigations in the past.  As he has been cleared by his cardiologist to undergo these procedures, we will schedule for an EGD and colonoscopy.  He can continue taking his oral iron as he is currently taking.  Patient understood and agreed.  - Schedule EGD and colonoscopy, will need to stop Eliquis 2 days prior to procedures - will obtain clearance for this - Continue oral iron  All questions were answered.      Total visit time: I spent a total of  25 minutes  Maylon Peppers, MD Gastroenterology and  Hepatology [facility]

## 2021-05-24 ENCOUNTER — Encounter (INDEPENDENT_AMBULATORY_CARE_PROVIDER_SITE_OTHER): Payer: Self-pay

## 2021-05-24 ENCOUNTER — Other Ambulatory Visit (INDEPENDENT_AMBULATORY_CARE_PROVIDER_SITE_OTHER): Payer: Self-pay

## 2021-05-24 ENCOUNTER — Telehealth (INDEPENDENT_AMBULATORY_CARE_PROVIDER_SITE_OTHER): Payer: Self-pay

## 2021-05-24 DIAGNOSIS — Z5181 Encounter for therapeutic drug level monitoring: Secondary | ICD-10-CM

## 2021-05-24 MED ORDER — PEG 3350-KCL-NA BICARB-NACL 420 G PO SOLR: 4000.0000 mL | ORAL | 0 refills | Status: DC

## 2021-05-24 NOTE — Telephone Encounter (Signed)
LeighAnn Iyan Flett, CMA  

## 2021-05-24 NOTE — Telephone Encounter (Signed)
Adam Shelton, CMA  

## 2021-06-22 ENCOUNTER — Other Ambulatory Visit (INDEPENDENT_AMBULATORY_CARE_PROVIDER_SITE_OTHER): Payer: Self-pay | Admitting: Gastroenterology

## 2021-06-22 DIAGNOSIS — Z5181 Encounter for therapeutic drug level monitoring: Secondary | ICD-10-CM | POA: Diagnosis not present

## 2021-06-22 DIAGNOSIS — Z79899 Other long term (current) drug therapy: Secondary | ICD-10-CM | POA: Diagnosis not present

## 2021-06-22 LAB — BASIC METABOLIC PANEL
BUN: 20 mg/dL (ref 7–25)
CO2: 27 mmol/L (ref 20–32)
Calcium: 9.6 mg/dL (ref 8.6–10.3)
Chloride: 103 mmol/L (ref 98–110)
Creat: 1.08 mg/dL (ref 0.70–1.25)
Glucose, Bld: 94 mg/dL (ref 65–139)
Potassium: 4.4 mmol/L (ref 3.5–5.3)
Sodium: 139 mmol/L (ref 135–146)

## 2021-06-26 ENCOUNTER — Other Ambulatory Visit: Payer: Self-pay

## 2021-06-26 ENCOUNTER — Ambulatory Visit (HOSPITAL_COMMUNITY): Payer: Medicare Other | Admitting: Anesthesiology

## 2021-06-26 ENCOUNTER — Encounter (HOSPITAL_COMMUNITY): Payer: Self-pay | Admitting: Gastroenterology

## 2021-06-26 ENCOUNTER — Ambulatory Visit (HOSPITAL_COMMUNITY)
Admission: RE | Admit: 2021-06-26 | Discharge: 2021-06-26 | Disposition: A | Discharge status: Home / Self Care | Payer: Medicare Other | Attending: Gastroenterology | Admitting: Gastroenterology

## 2021-06-26 ENCOUNTER — Encounter (HOSPITAL_COMMUNITY)
Admission: RE | Disposition: A | Discharge status: Home / Self Care | Payer: Self-pay | Source: Home / Self Care | Attending: Gastroenterology

## 2021-06-26 DIAGNOSIS — K31A19 Gastric intestinal metaplasia without dysplasia, unspecified site: Secondary | ICD-10-CM | POA: Diagnosis not present

## 2021-06-26 DIAGNOSIS — Z7901 Long term (current) use of anticoagulants: Secondary | ICD-10-CM | POA: Diagnosis not present

## 2021-06-26 DIAGNOSIS — K649 Unspecified hemorrhoids: Secondary | ICD-10-CM | POA: Diagnosis not present

## 2021-06-26 DIAGNOSIS — I2510 Atherosclerotic heart disease of native coronary artery without angina pectoris: Secondary | ICD-10-CM | POA: Diagnosis not present

## 2021-06-26 DIAGNOSIS — K2289 Other specified disease of esophagus: Secondary | ICD-10-CM | POA: Diagnosis not present

## 2021-06-26 DIAGNOSIS — D124 Benign neoplasm of descending colon: Secondary | ICD-10-CM | POA: Diagnosis not present

## 2021-06-26 DIAGNOSIS — K635 Polyp of colon: Secondary | ICD-10-CM | POA: Diagnosis not present

## 2021-06-26 DIAGNOSIS — Z955 Presence of coronary angioplasty implant and graft: Secondary | ICD-10-CM | POA: Insufficient documentation

## 2021-06-26 DIAGNOSIS — D125 Benign neoplasm of sigmoid colon: Secondary | ICD-10-CM | POA: Diagnosis not present

## 2021-06-26 DIAGNOSIS — K621 Rectal polyp: Secondary | ICD-10-CM

## 2021-06-26 DIAGNOSIS — K2960 Other gastritis without bleeding: Secondary | ICD-10-CM | POA: Diagnosis not present

## 2021-06-26 DIAGNOSIS — Z8673 Personal history of transient ischemic attack (TIA), and cerebral infarction without residual deficits: Secondary | ICD-10-CM | POA: Diagnosis not present

## 2021-06-26 DIAGNOSIS — D123 Benign neoplasm of transverse colon: Secondary | ICD-10-CM

## 2021-06-26 DIAGNOSIS — K3189 Other diseases of stomach and duodenum: Secondary | ICD-10-CM | POA: Diagnosis not present

## 2021-06-26 DIAGNOSIS — Z79890 Hormone replacement therapy (postmenopausal): Secondary | ICD-10-CM | POA: Diagnosis not present

## 2021-06-26 DIAGNOSIS — K589 Irritable bowel syndrome without diarrhea: Secondary | ICD-10-CM | POA: Insufficient documentation

## 2021-06-26 DIAGNOSIS — K5730 Diverticulosis of large intestine without perforation or abscess without bleeding: Secondary | ICD-10-CM | POA: Insufficient documentation

## 2021-06-26 DIAGNOSIS — Z951 Presence of aortocoronary bypass graft: Secondary | ICD-10-CM | POA: Diagnosis not present

## 2021-06-26 DIAGNOSIS — Z79899 Other long term (current) drug therapy: Secondary | ICD-10-CM | POA: Insufficient documentation

## 2021-06-26 DIAGNOSIS — D509 Iron deficiency anemia, unspecified: Secondary | ICD-10-CM | POA: Diagnosis not present

## 2021-06-26 DIAGNOSIS — Z888 Allergy status to other drugs, medicaments and biological substances status: Secondary | ICD-10-CM | POA: Diagnosis not present

## 2021-06-26 DIAGNOSIS — D122 Benign neoplasm of ascending colon: Secondary | ICD-10-CM | POA: Diagnosis not present

## 2021-06-26 DIAGNOSIS — K2940 Chronic atrophic gastritis without bleeding: Secondary | ICD-10-CM | POA: Diagnosis not present

## 2021-06-26 DIAGNOSIS — Z5181 Encounter for therapeutic drug level monitoring: Secondary | ICD-10-CM

## 2021-06-26 DIAGNOSIS — B9681 Helicobacter pylori [H. pylori] as the cause of diseases classified elsewhere: Secondary | ICD-10-CM | POA: Diagnosis not present

## 2021-06-26 DIAGNOSIS — K31A0 Gastric intestinal metaplasia, unspecified: Secondary | ICD-10-CM | POA: Diagnosis not present

## 2021-06-26 DIAGNOSIS — Z7982 Long term (current) use of aspirin: Secondary | ICD-10-CM | POA: Diagnosis not present

## 2021-06-26 HISTORY — PX: ESOPHAGOGASTRODUODENOSCOPY (EGD) WITH PROPOFOL: SHX5813

## 2021-06-26 HISTORY — PX: BIOPSY: SHX5522

## 2021-06-26 HISTORY — PX: COLONOSCOPY WITH PROPOFOL: SHX5780

## 2021-06-26 HISTORY — PX: HEMOSTASIS CLIP PLACEMENT: SHX6857

## 2021-06-26 HISTORY — PX: POLYPECTOMY: SHX149

## 2021-06-26 LAB — HM COLONOSCOPY

## 2021-06-26 SURGERY — COLONOSCOPY WITH PROPOFOL
Anesthesia: General

## 2021-06-26 MED ORDER — EPINEPHRINE 1 MG/10ML IJ SOSY
PREFILLED_SYRINGE | INTRAMUSCULAR | Status: AC
  Filled 2021-06-26: qty 10

## 2021-06-26 MED ORDER — PROPOFOL 10 MG/ML IV BOLUS
INTRAVENOUS | Status: DC | PRN
  Administered 2021-06-26: 90 mg via INTRAVENOUS

## 2021-06-26 MED ORDER — LIDOCAINE HCL (CARDIAC) PF 100 MG/5ML IV SOSY
PREFILLED_SYRINGE | INTRAVENOUS | Status: DC | PRN
  Administered 2021-06-26: 50 mg via INTRAVENOUS

## 2021-06-26 MED ORDER — ESOMEPRAZOLE MAGNESIUM 40 MG PO CPDR
40.0000 mg | DELAYED_RELEASE_CAPSULE | Freq: Two times a day (BID) | ORAL | 0 refills | Status: DC
Start: 2021-06-26 — End: 2021-07-03

## 2021-06-26 MED ORDER — SODIUM CHLORIDE (PF) 0.9 % IJ SOLN
PREFILLED_SYRINGE | INTRAMUSCULAR | Status: DC | PRN
  Administered 2021-06-26: 1 mL

## 2021-06-26 MED ORDER — LACTATED RINGERS IV SOLN: INTRAVENOUS | Status: DC

## 2021-06-26 MED ORDER — PROPOFOL 500 MG/50ML IV EMUL
INTRAVENOUS | Status: DC | PRN
  Administered 2021-06-26: 125 ug/kg/min via INTRAVENOUS

## 2021-06-26 NOTE — Transfer of Care (Signed)
Immediate Anesthesia Transfer of Care Note  Patient: Adam Shelton  Procedure(s) Performed: COLONOSCOPY WITH PROPOFOL ESOPHAGOGASTRODUODENOSCOPY (EGD) WITH PROPOFOL BIOPSY POLYPECTOMY INTESTINAL HEMOSTASIS CLIP PLACEMENT  Patient Location: Endoscopy Unit  Anesthesia Type:General  Level of Consciousness: drowsy  Airway & Oxygen Therapy: Patient Spontanous Breathing  Post-op Assessment: Report given to RN and Post -op Vital signs reviewed and stable  Post vital signs: Reviewed and stable  Last Vitals:  Vitals Value Taken Time  BP    Temp    Pulse 56   Resp 19   SpO2 95%     Last Pain:  Vitals:   06/26/21 1246  TempSrc:   PainSc: 0-No pain         Complications: No notable events documented.

## 2021-06-26 NOTE — Op Note (Addendum)
[facility] Patient Name: Adam Shelton Procedure Date: 06/26/2021 11:45 AM MRN: [id number] Date of Birth: 26-Dec-1951 Attending MD: Maylon Peppers ,  CSN: [id number] Age: 70 Admit Type: Outpatient Procedure:                Upper GI endoscopy Indications:               Providers:                Maylon Peppers, Jeanann Lewandowsky. Sharon Seller, RN, Raphael Gibney, Technician Referring MD:              Medicines:                Monitored Anesthesia Care Complications:            No immediate complications. Estimated Blood Loss:     Estimated blood loss: none. Procedure:                Pre-Anesthesia Assessment:                           - Prior to the procedure, a History and Physical                            was performed, and patient medications, allergies                            and sensitivities were reviewed. The patient's                            tolerance of previous anesthesia was reviewed.                           - The risks and benefits of the procedure and the                            sedation options and risks were discussed with the                            patient. All questions were answered and informed                            consent was obtained.                           After obtaining informed consent, the endoscope was                            passed under direct vision. Throughout the                            procedure, the patient's blood pressure, pulse, and                            oxygen saturations were monitored continuously. The  [id number]) was introduced through the mouth,                            and advanced to the second part of duodenum. The                            upper GI endoscopy was accomplished without                            difficulty. The patient tolerated the procedure                            well. Scope In: 12:47:51 PM Scope Out: 1:05:45 PM Total  Procedure Duration: 0 hours 17 minutes 54 seconds  Findings:      The esophagus and gastroesophageal junction were examined with white       light and narrow band imaging (NBI). There were esophageal mucosal       changes suspicious for short-segment Barrett's esophagus. These changes       involved the mucosa at the upper extent of the gastric folds (37 cm from       the incisors) extending to the Z-line (34 cm from the incisors). One       tongue of salmon-colored mucosa was present from 34 to 37 cm. The       maximum longitudinal extent of these esophageal mucosal changes was 3 cm       in length. This was biopsied with a cold forceps for histology.      Diffuse atrophic mucosa was found in the gastric antrum. Biopsies were       taken with a cold forceps for Helicobacter pylori testing.      A few localized, medium erosions without bleeding were found in the       first portion of the duodenum. Biopsies from the healthy duodenum were       taken with a cold forceps for histology. Impression:               - Esophageal mucosal changes suspicious for                            short-segment Barrett's esophagus. Biopsied.                           - Gastric mucosal atrophy. Biopsied.                           - Erosive duodenopathy without bleeding. Biopsied. Moderate Sedation:      Per Anesthesia Care Recommendation:           - Discharge patient to home (ambulatory).                           - Resume previous diet.                           - Await pathology results.                           - Check H. pylori serology.                           -  Restart Xarelto tonight.                           - Take Nexium 40 mg twice a day for 3 months. Procedure Code(s):        --- Professional ---                           [phone], Esophagogastroduodenoscopy, flexible,                            transoral; with biopsy, single or multiple Diagnosis Code(s):        --- Professional ---                            K22.8, Other specified diseases of esophagus                           K31.89, Other diseases of stomach and duodenum CPT copyright 2019 American Medical Association. All rights reserved. The codes documented in this report are preliminary and upon coder review may  be revised to meet current compliance requirements. Maylon Peppers, MD Maylon Peppers,  06/26/2021 2:07:50 PM This report has been signed electronically. Number of Addenda: 0

## 2021-06-26 NOTE — H&P (Signed)
Adam Shelton is an 70 y.o. male.   Chief Complaint: iron deficiency anemia HPI: Adam Shelton is a 70 y.o. male from [place] with PMH CAD, h/o stroke on Xarelto, HTN, hx of SBO, systolic HF and small bowel ganglioneuroma  S/p SB resection, coming to the clinic for follow-up of iron deficiency anemia.  Patient states that he has felt well.  Denies have any complaints such as melena, hematochezia, abdominal pain, abdominal distention, fever, chills.  He is still taking the oral iron supplementation.  He is still on Xarelto.  Last EGD: never Last Colonoscopy:never  Past Medical History:  Diagnosis Date   Arthritis    Dyslipidemia    GERD (gastroesophageal reflux disease)    H/O: CVA (cerebrovascular accident), 06/22/15 Rt frontal rec'd TPA 08/14/2015   Heart attack ([place]) 09/24/94   Heart disease, unspecified    Hypertension    Hypothyroidism    Hypothyroidism    Irritable bowel syndrome    Mitral valve disorders(424.0)    Obesity    Pulmonary valve disorders    Stroke [facility])     Past Surgical History:  Procedure Laterality Date   CARDIAC CATHETERIZATION  12/10/94   CARDIAC CATHETERIZATION N/A 08/14/2015   Procedure: Left Heart Cath and Coronary Angiography;  Surgeon: Sherren Mocha, MD;  Location: [place] [facility];  Service: Cardiovascular;  Laterality: N/A;   CARDIAC CATHETERIZATION N/A 11/24/2015   Procedure: Left Heart Cath and Cors/Grafts Angiography;  Surgeon: Troy Sine, MD;  Location: [place] [facility];  Service: Cardiovascular;  Laterality: N/A;   CARDIAC CATHETERIZATION  11/24/2015   Procedure: Coronary Stent Intervention;  Surgeon: Troy Sine, MD;  Location: [place] [facility];  Service: Cardiovascular;;   CORONARY ARTERY BYPASS GRAFT N/A 08/18/2015   Procedure: CORONARY ARTERY BYPASS GRAFTING (CABG)  X 5 UTILIZING THE LEFT INTERNAL MAMMARY ARTERY AND ENDOSCOPICALLY HARVESTED RIGHT AND LEFT SAPHENEOUS VEINS.;  Surgeon: Melrose Nakayama, MD;  Location: [place];   Service: Open Heart Surgery;  Laterality: N/A;   HERNIA REPAIR     age 26   TEE WITHOUT CARDIOVERSION N/A 08/18/2015   Procedure: TRANSESOPHAGEAL ECHOCARDIOGRAM (TEE);  Surgeon: Melrose Nakayama, MD;  Location: [place];  Service: Open Heart Surgery;  Laterality: N/A;    Family History  Problem Relation Age of Onset   Kidney disease Father        died age 34   Heart disease Mother        had angioplasty   Social History:  reports that he has never smoked. He has never used smokeless tobacco. He reports that he does not drink alcohol and does not use drugs.  Allergies:  Allergies  Allergen Reactions   Lipitor [Atorvastatin] Other (See Comments)    Myalgias   Lisinopril Other (See Comments)    Angioedema   Peanut-Containing Drug Products Itching   Shellfish-Derived Products Itching and Rash    Medications Prior to Admission  Medication Sig Dispense Refill   acetaminophen (TYLENOL) 500 MG tablet Take 500 mg by mouth at bedtime.     ALPRAZolam (XANAX) 0.5 MG tablet Take 0.25-0.5 mg by mouth at bedtime as needed for sleep.  0   Ascorbic Acid (VITAMIN C) 1000 MG tablet Take 1,000 mg by mouth daily.     aspirin 81 MG EC tablet Take 81 mg by mouth every morning.     Calcium Carb-Cholecalciferol (CALCIUM 600+D) 600-800 MG-UNIT TABS Take 1 tablet by mouth every other day.  Cholecalciferol (VITAMIN D3) 50 MCG (2000 UT) capsule Take 2,000 Units by mouth daily.     Cranberry 500 MG TABS Take 500 mg by mouth every morning.     dicyclomine (BENTYL) 20 MG tablet Take 20 mg by mouth 2 (two) times daily as needed for spasms.     esomeprazole (NEXIUM) 40 MG capsule Take 40 mg by mouth daily as needed (reflex).     famotidine (PEPCID) 20 MG tablet Take 20 mg by mouth daily.     folic acid (FOLVITE) 1 MG tablet Take 1 mg by mouth daily.     furosemide (LASIX) 20 MG tablet Take 20 mg by mouth 2 (two) times a week. If needed for fluid     levothyroxine (SYNTHROID, LEVOTHROID) 100 MCG tablet Take  100 mcg by mouth daily before breakfast.     loratadine (CLARITIN) 10 MG tablet Take 10 mg by mouth daily as needed for allergies.     metoprolol succinate (TOPROL-XL) 25 MG 24 hr tablet Take 1 tablet (25 mg total) by mouth 2 (two) times daily. 180 tablet 3   Multiple Vitamins-Minerals (MULTIVITAMIN WITH MINERALS) tablet Take 1 tablet by mouth every other day. Centrum     Multiple Vitamins-Minerals (OCUVITE-LUTEIN PO) Take 1 tablet by mouth 2 (two) times a week.     ondansetron (ZOFRAN) 4 MG tablet Take 4 mg by mouth daily as needed for nausea.     Polyethyl Glycol-Propyl Glycol (LUBRICANT EYE DROPS) 0.4-0.3 % SOLN Place 1 drop into both eyes 3 (three) times daily as needed (dry/irritated eyes.).     polyethylene glycol (MIRALAX / GLYCOLAX) 17 g packet Take 17 g by mouth at bedtime.     potassium chloride SA (KLOR-CON) 20 MEQ tablet Take 20 mEq by mouth 2 (two) times a week. As needed with Lasix     rivaroxaban (XARELTO) 2.5 MG TABS tablet Take 1 tablet (2.5 mg total) by mouth 2 (two) times daily. 180 tablet 1   rosuvastatin (CRESTOR) 20 MG tablet Take 1 tablet (20 mg total) by mouth daily. (Patient taking differently: Take 20 mg by mouth at bedtime.) 90 tablet 1   sildenafil (REVATIO) 20 MG tablet Take 1 tablet (20 mg total) by mouth daily as needed. (Patient taking differently: Take 20 mg by mouth daily as needed (ED).) 90 tablet 3   tadalafil (CIALIS) 5 MG tablet Take 5 mg by mouth daily as needed for erectile dysfunction.     tamsulosin (FLOMAX) 0.4 MG CAPS capsule Take 1 capsule (0.4 mg total) by mouth daily. (Patient taking differently: Take 0.4 mg by mouth in the morning and at bedtime.) 90 capsule 3   vitamin E 200 UNIT capsule Take 200 Units by mouth daily.     FEROSUL 325 (65 Fe) MG tablet Take 325 mg by mouth 2 (two) times a week.     nitroGLYCERIN (NITROSTAT) 0.4 MG SL tablet Place 1 tablet (0.4 mg total) under the tongue every 5 (five) minutes as needed for chest pain. (Patient taking  differently: Place 0.4 mg under the tongue every 5 (five) minutes x 3 doses as needed for chest pain.) 25 tablet 3   polyethylene glycol-electrolytes (TRILYTE) 420 g solution Take 4,000 mLs by mouth as directed. 4000 mL 0   SUPREP BOWEL PREP KIT 17.5-3.13-1.6 GM/177ML SOLN Take 354 mLs by mouth as directed. (Patient not taking: Reported on 05/21/2021)      No results found for this or any previous visit (from the past 48 hour(s)). No  results found.  Review of Systems  Constitutional: Negative.   HENT: Negative.    Eyes: Negative.   Respiratory: Negative.    Cardiovascular: Negative.   Gastrointestinal: Negative.   Endocrine: Negative.   Genitourinary: Negative.   Musculoskeletal: Negative.   Skin: Negative.   Allergic/Immunologic: Negative.   Neurological: Negative.   Hematological: Negative.   Psychiatric/Behavioral: Negative.     Blood pressure (!) 170/112, pulse 91, temperature 98.6 F (37 C), temperature source Oral, resp. rate 19, SpO2 93 %. Physical Exam  GENERAL: The patient is AO x3, in no acute distress. HEENT: Head is normocephalic and atraumatic. EOMI are intact. Mouth is well hydrated and without lesions. NECK: Supple. No masses LUNGS: Clear to auscultation. No presence of rhonchi/wheezing/rales. Adequate chest expansion HEART: RRR, normal s1 and s2. ABDOMEN: Soft, nontender, no guarding, no peritoneal signs, and nondistended. BS +. No masses. EXTREMITIES: Without any cyanosis, clubbing, rash, lesions or edema. NEUROLOGIC: AOx3, no focal motor deficit. SKIN: no jaundice, no rashes  Assessment/Plan KACIN DANCY is a 70 y.o. male from [place] with PMH CAD, h/o stroke on Xarelto, HTN, hx of SBO, systolic HF and small bowel ganglioneuroma  S/p SB resection, coming to the clinic for follow-up of iron deficiency anemia.  We will proceed with EGD and colonoscopy.  Harvel Quale, MD 06/26/2021, 12:38 PM

## 2021-06-26 NOTE — Discharge Instructions (Addendum)
You are being discharged to home.  Resume your previous diet.  We are waiting for your pathology results.  Take Nexium 40 mg twice a day for 3 months. Restart Xarelto tonight. Your physician has recommended a repeat colonoscopy for surveillance based on pathology results.

## 2021-06-26 NOTE — Anesthesia Postprocedure Evaluation (Signed)
Anesthesia Post Note  Patient: Adam Shelton  Procedure(s) Performed: COLONOSCOPY WITH PROPOFOL ESOPHAGOGASTRODUODENOSCOPY (EGD) WITH PROPOFOL BIOPSY POLYPECTOMY INTESTINAL HEMOSTASIS CLIP PLACEMENT  Patient location during evaluation: Endoscopy Anesthesia Type: General Level of consciousness: awake and alert and oriented Pain management: pain level controlled Vital Signs Assessment: post-procedure vital signs reviewed and stable Respiratory status: spontaneous breathing and respiratory function stable Cardiovascular status: blood pressure returned to baseline and stable Postop Assessment: no apparent nausea or vomiting Anesthetic complications: no   No notable events documented.   Last Vitals:  Vitals:   06/26/21 1228 06/26/21 1409  BP: (!) 170/112 106/68  Pulse: 91   Resp: 19 19  Temp: 37 C   SpO2: 93% 95%    Last Pain:  Vitals:   06/26/21 1246  TempSrc:   PainSc: 0-No pain                 Ethlyn Alto C Toddy Boyd

## 2021-06-26 NOTE — Anesthesia Preprocedure Evaluation (Signed)
Anesthesia Evaluation  Patient identified by MRN, date of birth, ID band Patient awake    Reviewed: Allergy & Precautions, NPO status , Patient's Chart, lab work & pertinent test results, reviewed documented beta blocker date and time   History of Anesthesia Complications Negative for: history of anesthetic complications  Airway Mallampati: II  TM Distance: >3 FB Neck ROM: Full    Dental  (+) Dental Advisory Given   Pulmonary    Pulmonary exam normal breath sounds clear to auscultation       Cardiovascular Exercise Tolerance: Good hypertension, Pt. on medications and Pt. on home beta blockers + CAD, + Past MI and + CABG  Normal cardiovascular exam Rhythm:Regular Rate:Normal     Neuro/Psych Anxiety CVA, Residual Symptoms    GI/Hepatic Neg liver ROS, GERD  Medicated and Controlled,  Endo/Other  Hypothyroidism   Renal/GU negative Renal ROS     Musculoskeletal  (+) Arthritis , Osteoarthritis,    Abdominal   Peds  Hematology  (+) anemia ,   Anesthesia Other Findings   Reproductive/Obstetrics                            Anesthesia Physical Anesthesia Plan  ASA: 3  Anesthesia Plan: General   Post-op Pain Management:    Induction: Intravenous  PONV Risk Score and Plan: Propofol infusion  Airway Management Planned: Nasal Cannula and Natural Airway  Additional Equipment:   Intra-op Plan:   Post-operative Plan:   Informed Consent: I have reviewed the patients History and Physical, chart, labs and discussed the procedure including the risks, benefits and alternatives for the proposed anesthesia with the patient or authorized representative who has indicated his/her understanding and acceptance.     Dental advisory given  Plan Discussed with: CRNA and Surgeon  Anesthesia Plan Comments:         Anesthesia Quick Evaluation

## 2021-06-26 NOTE — Op Note (Signed)
[facility] Patient Name: Adam Shelton Procedure Date: 06/26/2021 1:09 PM MRN: [id number] Date of Birth: Dec 29, 1951 Attending MD: Maylon Peppers ,  CSN: [id number] Age: 70 Admit Type: Outpatient Procedure:                Colonoscopy Indications:              Iron deficiency anemia Providers:                Maylon Peppers, [place] Sharon Seller, RN, Raphael Gibney, Technician Referring MD:              Medicines:                Monitored Anesthesia Care Complications:            No immediate complications. Estimated Blood Loss:     Estimated blood loss: none. Procedure:                Pre-Anesthesia Assessment:                           - Prior to the procedure, a History and Physical                            was performed, and patient medications, allergies                            and sensitivities were reviewed. The patient's                            tolerance of previous anesthesia was reviewed.                           - The risks and benefits of the procedure and the                            sedation options and risks were discussed with the                            patient. All questions were answered and informed                            consent was obtained.                           After obtaining informed consent, the colonoscope                            was passed under direct vision. Throughout the                            procedure, the patient's blood pressure, pulse, and                            oxygen saturations were monitored continuously. The  [id number]-[id number] ([id number]) scope was introduced through                            the anus and advanced to the the terminal ileum.                            The colonoscopy was performed without difficulty.                            The patient tolerated the procedure well. The                            quality of the bowel preparation was  adequate. Scope In: 1:11:03 PM Scope Out: 2:03:28 PM Scope Withdrawal Time: 0 hours 47 minutes 41 seconds  Total Procedure Duration: 0 hours 52 minutes 25 seconds  Findings:      Hemorrhoids were found on perianal exam.      The terminal ileum appeared normal.      Three sessile polyps were found in the transverse colon and ascending       colon. The polyps were 4 to 6 mm in size. These polyps were removed with       a cold snare. Resection and retrieval were complete.      Four sessile polyps were found in the transverse colon and ascending       colon. The polyps were 1 to 2 mm in size. These polyps were removed with       a cold biopsy forceps. Resection and retrieval were complete.      Three sessile polyps were found in the descending colon. The polyps were       4 to 6 mm in size. These polyps were removed with a cold snare.       Resection and retrieval were complete.      A 20 mm polyp was found in the sigmoid colon. The polyp was       pedunculated. Area was successfully injected with 1 mL of a 1:10,000       solution of epinephrine for prevention of bleeding from stalk. The polyp       was removed with a hot snare. Resection and retrieval were complete. To       prevent bleeding post-intervention, one hemostatic clip was successfully       placed. There was no bleeding at the end of the procedure.      Multiple small and large-mouthed diverticula were found in the sigmoid       colon.      A 4 mm polyp was found in the rectum. The polyp was sessile. The polyp       was removed with a cold snare. Resection and retrieval were complete.      The retroflexed view of the distal rectum and anal verge was normal and       showed no anal or rectal abnormalities. Impression:               - Hemorrhoids found on perianal exam.                           - The examined portion of the ileum was normal.                           -  Three 4 to 6 mm polyps in the transverse colon                             and in the ascending colon, removed with a cold                            snare. Resected and retrieved.                           - Four 1 to 2 mm polyps in the transverse colon and                            in the ascending colon, removed with a cold biopsy                            forceps. Resected and retrieved.                           - Three 4 to 6 mm polyps in the descending colon,                            removed with a cold snare. Resected and retrieved.                           - One 20 mm polyp in the sigmoid colon, removed                            with a hot snare. Resected and retrieved. Injected.                            Clip was placed.                           - Diverticulosis in the sigmoid colon.                           - One 4 mm polyp in the rectum, removed with a cold                            snare. Resected and retrieved.                           - The distal rectum and anal verge are normal on                            retroflexion view. Moderate Sedation:      Per Anesthesia Care Recommendation:           - Discharge patient to home (ambulatory).                           - Resume previous diet.                           -  Await pathology results.                           - Repeat colonoscopy for surveillance based on                            pathology results. Procedure Code(s):        --- Professional ---                           [id number], Colonoscopy, flexible; with removal of                            tumor(s), polyp(s), or other lesion(s) by snare                            technique                           45380, 44, Colonoscopy, flexible; with biopsy,                            single or multiple Diagnosis Code(s):        --- Professional ---                           K64.9, Unspecified hemorrhoids                           K63.5, Polyp of colon                           K62.1, Rectal polyp                            D50.9, Iron deficiency anemia, unspecified                           K57.30, Diverticulosis of large intestine without                            perforation or abscess without bleeding CPT copyright 2019 American [employer] Association. All rights reserved. The codes documented in this report are preliminary and upon coder review may  be revised to meet current compliance requirements. Maylon Peppers, MD Maylon Peppers,  06/26/2021 2:20:21 PM This report has been signed electronically. Number of Addenda: 0

## 2021-06-27 LAB — H. PYLORI ANTIBODY, IGG: H Pylori IgG: 6.35 Index Value — ABNORMAL HIGH (ref 0.00–0.79)

## 2021-06-28 DIAGNOSIS — Z20822 Contact with and (suspected) exposure to covid-19: Secondary | ICD-10-CM | POA: Diagnosis not present

## 2021-06-28 DIAGNOSIS — I110 Hypertensive heart disease with heart failure: Secondary | ICD-10-CM | POA: Diagnosis not present

## 2021-06-28 DIAGNOSIS — Z4682 Encounter for fitting and adjustment of non-vascular catheter: Secondary | ICD-10-CM | POA: Diagnosis not present

## 2021-06-28 DIAGNOSIS — R109 Unspecified abdominal pain: Secondary | ICD-10-CM | POA: Diagnosis not present

## 2021-06-28 DIAGNOSIS — I255 Ischemic cardiomyopathy: Secondary | ICD-10-CM | POA: Diagnosis not present

## 2021-06-28 DIAGNOSIS — K56600 Partial intestinal obstruction, unspecified as to cause: Secondary | ICD-10-CM | POA: Diagnosis not present

## 2021-06-28 DIAGNOSIS — Z951 Presence of aortocoronary bypass graft: Secondary | ICD-10-CM | POA: Diagnosis not present

## 2021-06-28 DIAGNOSIS — I10 Essential (primary) hypertension: Secondary | ICD-10-CM | POA: Diagnosis not present

## 2021-06-28 DIAGNOSIS — I517 Cardiomegaly: Secondary | ICD-10-CM | POA: Diagnosis not present

## 2021-06-28 DIAGNOSIS — Z7982 Long term (current) use of aspirin: Secondary | ICD-10-CM | POA: Diagnosis not present

## 2021-06-28 DIAGNOSIS — Z882 Allergy status to sulfonamides status: Secondary | ICD-10-CM | POA: Diagnosis not present

## 2021-06-28 DIAGNOSIS — I5022 Chronic systolic (congestive) heart failure: Secondary | ICD-10-CM | POA: Diagnosis not present

## 2021-06-28 DIAGNOSIS — Z888 Allergy status to other drugs, medicaments and biological substances status: Secondary | ICD-10-CM | POA: Diagnosis not present

## 2021-06-28 DIAGNOSIS — B9681 Helicobacter pylori [H. pylori] as the cause of diseases classified elsewhere: Secondary | ICD-10-CM | POA: Diagnosis not present

## 2021-06-28 DIAGNOSIS — K56609 Unspecified intestinal obstruction, unspecified as to partial versus complete obstruction: Secondary | ICD-10-CM | POA: Diagnosis not present

## 2021-06-28 DIAGNOSIS — Z91010 Allergy to peanuts: Secondary | ICD-10-CM | POA: Diagnosis not present

## 2021-06-28 DIAGNOSIS — Z91013 Allergy to seafood: Secondary | ICD-10-CM | POA: Diagnosis not present

## 2021-06-28 DIAGNOSIS — Z7901 Long term (current) use of anticoagulants: Secondary | ICD-10-CM | POA: Diagnosis not present

## 2021-06-28 DIAGNOSIS — E785 Hyperlipidemia, unspecified: Secondary | ICD-10-CM | POA: Diagnosis not present

## 2021-06-28 DIAGNOSIS — I2510 Atherosclerotic heart disease of native coronary artery without angina pectoris: Secondary | ICD-10-CM | POA: Diagnosis not present

## 2021-06-28 DIAGNOSIS — Z955 Presence of coronary angioplasty implant and graft: Secondary | ICD-10-CM | POA: Diagnosis not present

## 2021-06-28 DIAGNOSIS — I639 Cerebral infarction, unspecified: Secondary | ICD-10-CM | POA: Diagnosis not present

## 2021-06-28 DIAGNOSIS — Z8673 Personal history of transient ischemic attack (TIA), and cerebral infarction without residual deficits: Secondary | ICD-10-CM | POA: Diagnosis not present

## 2021-06-28 DIAGNOSIS — Z8679 Personal history of other diseases of the circulatory system: Secondary | ICD-10-CM | POA: Diagnosis not present

## 2021-06-28 DIAGNOSIS — K2970 Gastritis, unspecified, without bleeding: Secondary | ICD-10-CM | POA: Diagnosis not present

## 2021-06-29 DIAGNOSIS — Z951 Presence of aortocoronary bypass graft: Secondary | ICD-10-CM | POA: Diagnosis not present

## 2021-06-29 DIAGNOSIS — I2510 Atherosclerotic heart disease of native coronary artery without angina pectoris: Secondary | ICD-10-CM | POA: Diagnosis not present

## 2021-06-29 DIAGNOSIS — K56609 Unspecified intestinal obstruction, unspecified as to partial versus complete obstruction: Secondary | ICD-10-CM | POA: Diagnosis not present

## 2021-06-29 DIAGNOSIS — Z8673 Personal history of transient ischemic attack (TIA), and cerebral infarction without residual deficits: Secondary | ICD-10-CM | POA: Diagnosis not present

## 2021-06-29 DIAGNOSIS — Z8679 Personal history of other diseases of the circulatory system: Secondary | ICD-10-CM | POA: Diagnosis not present

## 2021-06-29 DIAGNOSIS — K56600 Partial intestinal obstruction, unspecified as to cause: Secondary | ICD-10-CM | POA: Diagnosis not present

## 2021-06-29 DIAGNOSIS — I10 Essential (primary) hypertension: Secondary | ICD-10-CM | POA: Diagnosis not present

## 2021-06-29 DIAGNOSIS — I517 Cardiomegaly: Secondary | ICD-10-CM | POA: Diagnosis not present

## 2021-06-29 DIAGNOSIS — R109 Unspecified abdominal pain: Secondary | ICD-10-CM | POA: Diagnosis not present

## 2021-06-29 LAB — SURGICAL PATHOLOGY

## 2021-06-30 DIAGNOSIS — Z8673 Personal history of transient ischemic attack (TIA), and cerebral infarction without residual deficits: Secondary | ICD-10-CM | POA: Diagnosis not present

## 2021-06-30 DIAGNOSIS — Z888 Allergy status to other drugs, medicaments and biological substances status: Secondary | ICD-10-CM | POA: Diagnosis not present

## 2021-06-30 DIAGNOSIS — Z8679 Personal history of other diseases of the circulatory system: Secondary | ICD-10-CM | POA: Diagnosis not present

## 2021-06-30 DIAGNOSIS — I5022 Chronic systolic (congestive) heart failure: Secondary | ICD-10-CM | POA: Diagnosis not present

## 2021-06-30 DIAGNOSIS — Z4682 Encounter for fitting and adjustment of non-vascular catheter: Secondary | ICD-10-CM | POA: Diagnosis not present

## 2021-06-30 DIAGNOSIS — K56609 Unspecified intestinal obstruction, unspecified as to partial versus complete obstruction: Secondary | ICD-10-CM | POA: Diagnosis not present

## 2021-06-30 DIAGNOSIS — K56600 Partial intestinal obstruction, unspecified as to cause: Secondary | ICD-10-CM | POA: Diagnosis not present

## 2021-06-30 DIAGNOSIS — I110 Hypertensive heart disease with heart failure: Secondary | ICD-10-CM | POA: Diagnosis not present

## 2021-06-30 DIAGNOSIS — I255 Ischemic cardiomyopathy: Secondary | ICD-10-CM | POA: Diagnosis not present

## 2021-07-01 DIAGNOSIS — I639 Cerebral infarction, unspecified: Secondary | ICD-10-CM | POA: Diagnosis not present

## 2021-07-01 DIAGNOSIS — I2510 Atherosclerotic heart disease of native coronary artery without angina pectoris: Secondary | ICD-10-CM | POA: Diagnosis not present

## 2021-07-01 DIAGNOSIS — K56600 Partial intestinal obstruction, unspecified as to cause: Secondary | ICD-10-CM | POA: Diagnosis not present

## 2021-07-01 DIAGNOSIS — I110 Hypertensive heart disease with heart failure: Secondary | ICD-10-CM | POA: Diagnosis not present

## 2021-07-03 ENCOUNTER — Other Ambulatory Visit (INDEPENDENT_AMBULATORY_CARE_PROVIDER_SITE_OTHER): Payer: Self-pay | Admitting: Gastroenterology

## 2021-07-03 ENCOUNTER — Encounter (HOSPITAL_COMMUNITY): Payer: Self-pay | Admitting: Gastroenterology

## 2021-07-03 ENCOUNTER — Encounter (INDEPENDENT_AMBULATORY_CARE_PROVIDER_SITE_OTHER): Payer: Self-pay | Admitting: *Deleted

## 2021-07-03 DIAGNOSIS — B9681 Helicobacter pylori [H. pylori] as the cause of diseases classified elsewhere: Secondary | ICD-10-CM

## 2021-07-03 MED ORDER — BISMUTH 262 MG PO CHEW
2.0000 | CHEWABLE_TABLET | Freq: Four times a day (QID) | ORAL | 0 refills | Status: DC
Start: 2021-07-03 — End: 2021-07-09

## 2021-07-03 MED ORDER — ESOMEPRAZOLE MAGNESIUM 40 MG PO CPDR: 40.0000 mg | DELAYED_RELEASE_CAPSULE | Freq: Two times a day (BID) | ORAL | 0 refills | Status: AC

## 2021-07-03 MED ORDER — TETRACYCLINE HCL 500 MG PO CAPS: 500.0000 mg | ORAL_CAPSULE | Freq: Four times a day (QID) | ORAL | 0 refills | Status: DC

## 2021-07-03 MED ORDER — METRONIDAZOLE 500 MG PO TABS: 500.0000 mg | ORAL_TABLET | Freq: Three times a day (TID) | ORAL | 0 refills | Status: DC

## 2021-07-09 ENCOUNTER — Telehealth (INDEPENDENT_AMBULATORY_CARE_PROVIDER_SITE_OTHER): Payer: Self-pay

## 2021-07-09 ENCOUNTER — Other Ambulatory Visit (INDEPENDENT_AMBULATORY_CARE_PROVIDER_SITE_OTHER): Payer: Self-pay | Admitting: Gastroenterology

## 2021-07-09 DIAGNOSIS — K2970 Gastritis, unspecified, without bleeding: Secondary | ICD-10-CM

## 2021-07-09 DIAGNOSIS — B9681 Helicobacter pylori [H. pylori] as the cause of diseases classified elsewhere: Secondary | ICD-10-CM

## 2021-07-09 MED ORDER — CLARITHROMYCIN 500 MG PO TABS
500.0000 mg | ORAL_TABLET | Freq: Two times a day (BID) | ORAL | 0 refills | Status: AC
Start: 2021-07-09 — End: 2021-07-23

## 2021-07-09 MED ORDER — AMOXICILLIN 500 MG PO TABS: 1000.0000 mg | ORAL_TABLET | Freq: Two times a day (BID) | ORAL | 0 refills | Status: AC

## 2021-07-09 NOTE — Telephone Encounter (Signed)
Spoke to patient today, he reports that he has not been able to tolerate the bismuth and the tetracycline.  He would like to have a different regimen to treat his H. pylori as he is concerned that his stool is black.  I explained to him that this is a normal alteration with these medications but as he has not been able to tolerate it and is not comfortable taking the medication, we will switch him to amoxicillin and clarithromycin regimen.

## 2021-07-09 NOTE — Telephone Encounter (Signed)
Adam Shelton is stating that the bismuth is not suitable for him he is asking for something different called to the pharmacy, please advise?

## 2021-07-11 DIAGNOSIS — Z6824 Body mass index (BMI) 24.0-24.9, adult: Secondary | ICD-10-CM | POA: Diagnosis not present

## 2021-07-11 DIAGNOSIS — K56609 Unspecified intestinal obstruction, unspecified as to partial versus complete obstruction: Secondary | ICD-10-CM | POA: Diagnosis not present

## 2021-07-12 DIAGNOSIS — I110 Hypertensive heart disease with heart failure: Secondary | ICD-10-CM | POA: Diagnosis not present

## 2021-07-12 DIAGNOSIS — Z9049 Acquired absence of other specified parts of digestive tract: Secondary | ICD-10-CM | POA: Diagnosis not present

## 2021-07-12 DIAGNOSIS — I517 Cardiomegaly: Secondary | ICD-10-CM | POA: Diagnosis not present

## 2021-07-12 DIAGNOSIS — K661 Hemoperitoneum: Secondary | ICD-10-CM | POA: Diagnosis not present

## 2021-07-12 DIAGNOSIS — Z781 Physical restraint status: Secondary | ICD-10-CM | POA: Diagnosis not present

## 2021-07-12 DIAGNOSIS — J9601 Acute respiratory failure with hypoxia: Secondary | ICD-10-CM | POA: Diagnosis not present

## 2021-07-12 DIAGNOSIS — D62 Acute posthemorrhagic anemia: Secondary | ICD-10-CM | POA: Diagnosis not present

## 2021-07-12 DIAGNOSIS — Z7982 Long term (current) use of aspirin: Secondary | ICD-10-CM | POA: Diagnosis not present

## 2021-07-12 DIAGNOSIS — R1084 Generalized abdominal pain: Secondary | ICD-10-CM | POA: Diagnosis not present

## 2021-07-12 DIAGNOSIS — K9189 Other postprocedural complications and disorders of digestive system: Secondary | ICD-10-CM | POA: Diagnosis not present

## 2021-07-12 DIAGNOSIS — Z955 Presence of coronary angioplasty implant and graft: Secondary | ICD-10-CM | POA: Diagnosis not present

## 2021-07-12 DIAGNOSIS — Z7901 Long term (current) use of anticoagulants: Secondary | ICD-10-CM | POA: Diagnosis not present

## 2021-07-12 DIAGNOSIS — E039 Hypothyroidism, unspecified: Secondary | ICD-10-CM | POA: Diagnosis not present

## 2021-07-12 DIAGNOSIS — R638 Other symptoms and signs concerning food and fluid intake: Secondary | ICD-10-CM | POA: Diagnosis not present

## 2021-07-12 DIAGNOSIS — I252 Old myocardial infarction: Secondary | ICD-10-CM | POA: Diagnosis not present

## 2021-07-12 DIAGNOSIS — K91840 Postprocedural hemorrhage and hematoma of a digestive system organ or structure following a digestive system procedure: Secondary | ICD-10-CM | POA: Diagnosis not present

## 2021-07-12 DIAGNOSIS — Z8673 Personal history of transient ischemic attack (TIA), and cerebral infarction without residual deficits: Secondary | ICD-10-CM | POA: Diagnosis not present

## 2021-07-12 DIAGNOSIS — B9681 Helicobacter pylori [H. pylori] as the cause of diseases classified elsewhere: Secondary | ICD-10-CM | POA: Diagnosis not present

## 2021-07-12 DIAGNOSIS — E875 Hyperkalemia: Secondary | ICD-10-CM | POA: Diagnosis not present

## 2021-07-12 DIAGNOSIS — I5020 Unspecified systolic (congestive) heart failure: Secondary | ICD-10-CM | POA: Diagnosis not present

## 2021-07-12 DIAGNOSIS — E785 Hyperlipidemia, unspecified: Secondary | ICD-10-CM | POA: Diagnosis not present

## 2021-07-12 DIAGNOSIS — K5669 Other partial intestinal obstruction: Secondary | ICD-10-CM | POA: Diagnosis not present

## 2021-07-12 DIAGNOSIS — Z951 Presence of aortocoronary bypass graft: Secondary | ICD-10-CM | POA: Diagnosis not present

## 2021-07-12 DIAGNOSIS — K56609 Unspecified intestinal obstruction, unspecified as to partial versus complete obstruction: Secondary | ICD-10-CM | POA: Diagnosis not present

## 2021-07-12 DIAGNOSIS — R578 Other shock: Secondary | ICD-10-CM | POA: Diagnosis not present

## 2021-07-12 DIAGNOSIS — J9602 Acute respiratory failure with hypercapnia: Secondary | ICD-10-CM | POA: Diagnosis not present

## 2021-07-12 DIAGNOSIS — I5022 Chronic systolic (congestive) heart failure: Secondary | ICD-10-CM | POA: Diagnosis not present

## 2021-07-12 DIAGNOSIS — I340 Nonrheumatic mitral (valve) insufficiency: Secondary | ICD-10-CM | POA: Diagnosis not present

## 2021-07-12 DIAGNOSIS — Z4659 Encounter for fitting and adjustment of other gastrointestinal appliance and device: Secondary | ICD-10-CM | POA: Diagnosis not present

## 2021-07-12 DIAGNOSIS — R0989 Other specified symptoms and signs involving the circulatory and respiratory systems: Secondary | ICD-10-CM | POA: Diagnosis not present

## 2021-07-12 DIAGNOSIS — I2510 Atherosclerotic heart disease of native coronary artery without angina pectoris: Secondary | ICD-10-CM | POA: Diagnosis not present

## 2021-07-12 DIAGNOSIS — K565 Intestinal adhesions [bands], unspecified as to partial versus complete obstruction: Secondary | ICD-10-CM | POA: Diagnosis not present

## 2021-07-25 DIAGNOSIS — Z6824 Body mass index (BMI) 24.0-24.9, adult: Secondary | ICD-10-CM | POA: Diagnosis not present

## 2021-07-25 DIAGNOSIS — Z48815 Encounter for surgical aftercare following surgery on the digestive system: Secondary | ICD-10-CM | POA: Diagnosis not present

## 2021-07-25 DIAGNOSIS — I110 Hypertensive heart disease with heart failure: Secondary | ICD-10-CM | POA: Diagnosis not present

## 2021-07-25 DIAGNOSIS — E785 Hyperlipidemia, unspecified: Secondary | ICD-10-CM | POA: Diagnosis not present

## 2021-07-25 DIAGNOSIS — I509 Heart failure, unspecified: Secondary | ICD-10-CM | POA: Diagnosis not present

## 2021-07-25 DIAGNOSIS — I2510 Atherosclerotic heart disease of native coronary artery without angina pectoris: Secondary | ICD-10-CM | POA: Diagnosis not present

## 2021-07-25 DIAGNOSIS — K56609 Unspecified intestinal obstruction, unspecified as to partial versus complete obstruction: Secondary | ICD-10-CM | POA: Diagnosis not present

## 2021-07-25 DIAGNOSIS — I5023 Acute on chronic systolic (congestive) heart failure: Secondary | ICD-10-CM | POA: Diagnosis not present

## 2021-07-25 DIAGNOSIS — K5649 Other impaction of intestine: Secondary | ICD-10-CM | POA: Diagnosis not present

## 2021-07-26 DIAGNOSIS — K5649 Other impaction of intestine: Secondary | ICD-10-CM | POA: Diagnosis not present

## 2021-07-26 DIAGNOSIS — I5023 Acute on chronic systolic (congestive) heart failure: Secondary | ICD-10-CM | POA: Diagnosis not present

## 2021-08-01 DIAGNOSIS — M79661 Pain in right lower leg: Secondary | ICD-10-CM | POA: Diagnosis not present

## 2021-08-01 DIAGNOSIS — R590 Localized enlarged lymph nodes: Secondary | ICD-10-CM | POA: Diagnosis not present

## 2021-08-01 DIAGNOSIS — R0602 Shortness of breath: Secondary | ICD-10-CM | POA: Diagnosis not present

## 2021-08-01 DIAGNOSIS — I509 Heart failure, unspecified: Secondary | ICD-10-CM | POA: Diagnosis not present

## 2021-08-01 DIAGNOSIS — R791 Abnormal coagulation profile: Secondary | ICD-10-CM | POA: Diagnosis not present

## 2021-08-01 DIAGNOSIS — I2510 Atherosclerotic heart disease of native coronary artery without angina pectoris: Secondary | ICD-10-CM | POA: Diagnosis not present

## 2021-08-01 DIAGNOSIS — Z7982 Long term (current) use of aspirin: Secondary | ICD-10-CM | POA: Diagnosis not present

## 2021-08-01 DIAGNOSIS — M79662 Pain in left lower leg: Secondary | ICD-10-CM | POA: Diagnosis not present

## 2021-08-01 DIAGNOSIS — R911 Solitary pulmonary nodule: Secondary | ICD-10-CM | POA: Diagnosis not present

## 2021-08-01 DIAGNOSIS — Z951 Presence of aortocoronary bypass graft: Secondary | ICD-10-CM | POA: Diagnosis not present

## 2021-08-01 DIAGNOSIS — Z7901 Long term (current) use of anticoagulants: Secondary | ICD-10-CM | POA: Diagnosis not present

## 2021-08-01 DIAGNOSIS — K449 Diaphragmatic hernia without obstruction or gangrene: Secondary | ICD-10-CM | POA: Diagnosis not present

## 2021-08-01 DIAGNOSIS — Z9889 Other specified postprocedural states: Secondary | ICD-10-CM | POA: Diagnosis not present

## 2021-08-01 DIAGNOSIS — K4000 Bilateral inguinal hernia, with obstruction, without gangrene, not specified as recurrent: Secondary | ICD-10-CM | POA: Diagnosis not present

## 2021-08-01 DIAGNOSIS — Z888 Allergy status to other drugs, medicaments and biological substances status: Secondary | ICD-10-CM | POA: Diagnosis not present

## 2021-08-01 DIAGNOSIS — I110 Hypertensive heart disease with heart failure: Secondary | ICD-10-CM | POA: Diagnosis not present

## 2021-08-01 DIAGNOSIS — Z9049 Acquired absence of other specified parts of digestive tract: Secondary | ICD-10-CM | POA: Diagnosis not present

## 2021-08-01 DIAGNOSIS — J90 Pleural effusion, not elsewhere classified: Secondary | ICD-10-CM | POA: Diagnosis not present

## 2021-08-01 DIAGNOSIS — Z79899 Other long term (current) drug therapy: Secondary | ICD-10-CM | POA: Diagnosis not present

## 2021-08-01 DIAGNOSIS — R109 Unspecified abdominal pain: Secondary | ICD-10-CM | POA: Diagnosis not present

## 2021-08-01 DIAGNOSIS — R9389 Abnormal findings on diagnostic imaging of other specified body structures: Secondary | ICD-10-CM | POA: Diagnosis not present

## 2021-08-01 DIAGNOSIS — M160 Bilateral primary osteoarthritis of hip: Secondary | ICD-10-CM | POA: Diagnosis not present

## 2021-08-01 DIAGNOSIS — Z86018 Personal history of other benign neoplasm: Secondary | ICD-10-CM | POA: Diagnosis not present

## 2021-08-01 DIAGNOSIS — K5730 Diverticulosis of large intestine without perforation or abscess without bleeding: Secondary | ICD-10-CM | POA: Diagnosis not present

## 2021-08-01 DIAGNOSIS — E7800 Pure hypercholesterolemia, unspecified: Secondary | ICD-10-CM | POA: Diagnosis not present

## 2021-08-01 DIAGNOSIS — R188 Other ascites: Secondary | ICD-10-CM | POA: Diagnosis not present

## 2021-08-01 DIAGNOSIS — I5023 Acute on chronic systolic (congestive) heart failure: Secondary | ICD-10-CM | POA: Diagnosis not present

## 2021-08-01 DIAGNOSIS — Z882 Allergy status to sulfonamides status: Secondary | ICD-10-CM | POA: Diagnosis not present

## 2021-08-03 DIAGNOSIS — I5041 Acute combined systolic (congestive) and diastolic (congestive) heart failure: Secondary | ICD-10-CM | POA: Diagnosis not present

## 2021-08-03 DIAGNOSIS — E039 Hypothyroidism, unspecified: Secondary | ICD-10-CM | POA: Diagnosis not present

## 2021-08-03 DIAGNOSIS — I110 Hypertensive heart disease with heart failure: Secondary | ICD-10-CM | POA: Diagnosis not present

## 2021-08-07 DIAGNOSIS — I10 Essential (primary) hypertension: Secondary | ICD-10-CM | POA: Diagnosis not present

## 2021-08-07 DIAGNOSIS — E785 Hyperlipidemia, unspecified: Secondary | ICD-10-CM | POA: Diagnosis not present

## 2021-08-07 DIAGNOSIS — K56609 Unspecified intestinal obstruction, unspecified as to partial versus complete obstruction: Secondary | ICD-10-CM | POA: Diagnosis not present

## 2021-08-07 DIAGNOSIS — I2510 Atherosclerotic heart disease of native coronary artery without angina pectoris: Secondary | ICD-10-CM | POA: Diagnosis not present

## 2021-08-07 DIAGNOSIS — I5022 Chronic systolic (congestive) heart failure: Secondary | ICD-10-CM | POA: Diagnosis not present

## 2021-08-09 DIAGNOSIS — Z6822 Body mass index (BMI) 22.0-22.9, adult: Secondary | ICD-10-CM | POA: Diagnosis not present

## 2021-08-09 DIAGNOSIS — E039 Hypothyroidism, unspecified: Secondary | ICD-10-CM | POA: Diagnosis not present

## 2021-08-09 DIAGNOSIS — I5033 Acute on chronic diastolic (congestive) heart failure: Secondary | ICD-10-CM | POA: Diagnosis not present

## 2021-08-09 DIAGNOSIS — K219 Gastro-esophageal reflux disease without esophagitis: Secondary | ICD-10-CM | POA: Diagnosis not present

## 2021-08-09 DIAGNOSIS — K5900 Constipation, unspecified: Secondary | ICD-10-CM | POA: Diagnosis not present

## 2021-08-20 ENCOUNTER — Ambulatory Visit (INDEPENDENT_AMBULATORY_CARE_PROVIDER_SITE_OTHER): Payer: Medicare Other | Admitting: Gastroenterology

## 2021-08-23 DIAGNOSIS — Z48815 Encounter for surgical aftercare following surgery on the digestive system: Secondary | ICD-10-CM | POA: Diagnosis not present

## 2021-10-11 DIAGNOSIS — K219 Gastro-esophageal reflux disease without esophagitis: Secondary | ICD-10-CM | POA: Diagnosis not present

## 2021-10-11 DIAGNOSIS — L309 Dermatitis, unspecified: Secondary | ICD-10-CM | POA: Diagnosis not present

## 2021-10-11 DIAGNOSIS — I10 Essential (primary) hypertension: Secondary | ICD-10-CM | POA: Diagnosis not present

## 2021-10-11 DIAGNOSIS — Z6824 Body mass index (BMI) 24.0-24.9, adult: Secondary | ICD-10-CM | POA: Diagnosis not present

## 2021-10-11 DIAGNOSIS — I5033 Acute on chronic diastolic (congestive) heart failure: Secondary | ICD-10-CM | POA: Diagnosis not present

## 2021-11-08 DIAGNOSIS — E785 Hyperlipidemia, unspecified: Secondary | ICD-10-CM | POA: Diagnosis not present

## 2021-11-08 DIAGNOSIS — I2510 Atherosclerotic heart disease of native coronary artery without angina pectoris: Secondary | ICD-10-CM | POA: Diagnosis not present

## 2021-11-08 DIAGNOSIS — I5022 Chronic systolic (congestive) heart failure: Secondary | ICD-10-CM | POA: Diagnosis not present

## 2021-11-08 DIAGNOSIS — I10 Essential (primary) hypertension: Secondary | ICD-10-CM | POA: Diagnosis not present

## 2021-12-20 ENCOUNTER — Encounter (INDEPENDENT_AMBULATORY_CARE_PROVIDER_SITE_OTHER): Payer: Self-pay | Admitting: Gastroenterology

## 2021-12-20 ENCOUNTER — Ambulatory Visit (INDEPENDENT_AMBULATORY_CARE_PROVIDER_SITE_OTHER): Payer: Medicare Other | Admitting: Gastroenterology

## 2022-01-08 DIAGNOSIS — Z6824 Body mass index (BMI) 24.0-24.9, adult: Secondary | ICD-10-CM | POA: Diagnosis not present

## 2022-01-08 DIAGNOSIS — I10 Essential (primary) hypertension: Secondary | ICD-10-CM | POA: Diagnosis not present

## 2022-01-08 DIAGNOSIS — I5033 Acute on chronic diastolic (congestive) heart failure: Secondary | ICD-10-CM | POA: Diagnosis not present

## 2022-01-08 DIAGNOSIS — J40 Bronchitis, not specified as acute or chronic: Secondary | ICD-10-CM | POA: Diagnosis not present

## 2022-01-08 DIAGNOSIS — L309 Dermatitis, unspecified: Secondary | ICD-10-CM | POA: Diagnosis not present

## 2022-01-08 DIAGNOSIS — K219 Gastro-esophageal reflux disease without esophagitis: Secondary | ICD-10-CM | POA: Diagnosis not present

## 2022-01-08 DIAGNOSIS — Z0000 Encounter for general adult medical examination without abnormal findings: Secondary | ICD-10-CM | POA: Diagnosis not present

## 2022-02-18 DIAGNOSIS — I5033 Acute on chronic diastolic (congestive) heart failure: Secondary | ICD-10-CM | POA: Diagnosis not present

## 2022-02-18 DIAGNOSIS — L309 Dermatitis, unspecified: Secondary | ICD-10-CM | POA: Diagnosis not present

## 2022-02-18 DIAGNOSIS — Z6824 Body mass index (BMI) 24.0-24.9, adult: Secondary | ICD-10-CM | POA: Diagnosis not present

## 2022-02-18 DIAGNOSIS — I10 Essential (primary) hypertension: Secondary | ICD-10-CM | POA: Diagnosis not present

## 2022-02-18 DIAGNOSIS — Z0000 Encounter for general adult medical examination without abnormal findings: Secondary | ICD-10-CM | POA: Diagnosis not present

## 2022-02-18 DIAGNOSIS — J40 Bronchitis, not specified as acute or chronic: Secondary | ICD-10-CM | POA: Diagnosis not present

## 2022-02-18 DIAGNOSIS — R7303 Prediabetes: Secondary | ICD-10-CM | POA: Diagnosis not present

## 2022-02-18 DIAGNOSIS — H43813 Vitreous degeneration, bilateral: Secondary | ICD-10-CM | POA: Diagnosis not present

## 2022-02-19 DIAGNOSIS — I5022 Chronic systolic (congestive) heart failure: Secondary | ICD-10-CM | POA: Diagnosis not present

## 2022-02-19 DIAGNOSIS — I2510 Atherosclerotic heart disease of native coronary artery without angina pectoris: Secondary | ICD-10-CM | POA: Diagnosis not present

## 2022-02-19 DIAGNOSIS — E785 Hyperlipidemia, unspecified: Secondary | ICD-10-CM | POA: Diagnosis not present

## 2022-03-28 ENCOUNTER — Ambulatory Visit (INDEPENDENT_AMBULATORY_CARE_PROVIDER_SITE_OTHER): Payer: Medicare Other | Admitting: Urology

## 2022-03-28 ENCOUNTER — Encounter: Payer: Self-pay | Admitting: Urology

## 2022-03-28 VITALS — BP 144/84 | HR 69

## 2022-03-28 DIAGNOSIS — N401 Enlarged prostate with lower urinary tract symptoms: Secondary | ICD-10-CM | POA: Diagnosis not present

## 2022-03-28 DIAGNOSIS — N5201 Erectile dysfunction due to arterial insufficiency: Secondary | ICD-10-CM

## 2022-03-28 DIAGNOSIS — N390 Urinary tract infection, site not specified: Secondary | ICD-10-CM | POA: Diagnosis not present

## 2022-03-28 DIAGNOSIS — M8589 Other specified disorders of bone density and structure, multiple sites: Secondary | ICD-10-CM | POA: Diagnosis not present

## 2022-03-28 DIAGNOSIS — R351 Nocturia: Secondary | ICD-10-CM

## 2022-03-28 DIAGNOSIS — N138 Other obstructive and reflux uropathy: Secondary | ICD-10-CM | POA: Diagnosis not present

## 2022-03-28 DIAGNOSIS — M810 Age-related osteoporosis without current pathological fracture: Secondary | ICD-10-CM | POA: Diagnosis not present

## 2022-03-28 MED ORDER — SILDENAFIL CITRATE 20 MG PO TABS: 60.0000 mg | ORAL_TABLET | Freq: Every day | ORAL | 3 refills | Status: DC | PRN

## 2022-03-28 MED ORDER — TAMSULOSIN HCL 0.4 MG PO CAPS: 0.4000 mg | ORAL_CAPSULE | Freq: Every day | ORAL | 3 refills | Status: DC

## 2022-03-28 NOTE — Progress Notes (Signed)
?Subjective: ? ?1. BPH with urinary obstruction   ?2. Nocturia   ?3. Recurrent UTI   ?4. Erectile dysfunction due to arterial insufficiency   ?  ? ?03/28/22: He has had persistent ED. He is using the sildenafil with success.   He is using '60mg'$  prn.  He carries NTG but doesn't use it and knows to avoid it.   ? ?He has BPH with BOO and is doing well on tamsulosin.  His IPSS is 1.  ? ?He also has a history of recurrent pyelonephritis.   He remains on  a cranberry supplement and has done well.   His UA is clear but has 2+ protein.   He has had protein on UA's in 2022 as well but a normal Cr.  His PSA was 0.9 last year and he reports that it is 1.1 this year.  ? ?He had  laparoscopic exploration for recurrent SBO at [facility] in 2021 and then again in 7/22.  ? ?08/08/17: GU Hx: Adam Shelton is a 71 yo male who is sent in consultation by Dr. Sherrie Sport for recurrent pyelonephritis. He had right pyelonephritis in May with a multidrug resistant e. coli that was initially treated with a cefalosporin followed by macrobid but he required hospitalization for IV antibiotics. He did well but had recurrent symptoms and was admitted on 08/04/17 to [facility] for a fever of 102 and right flank pain. He had flushing and hypertension. He had dysuria on admission. He was discharged today. He was found to have e. coli on blood cultures and is currentlly on parenteral antibiotics for another 5 days. He had a CT in May 2018 that showed no stones or obstruction.  ? ? IPSS   ? ? [place] Name 03/28/22 1400  ?  ?  ?  ? International Prostate Symptom Score  ? How often have you had the sensation of not emptying your bladder? Not at All    ? How often have you had to urinate less than every two hours? Not at All    ? How often have you found you stopped and started again several times when you urinated? Not at All    ? How often have you found it difficult to postpone urination? Not at All    ? How often have you had a weak urinary stream? Not at All    ?  How often have you had to strain to start urination? Not at All    ? How many times did you typically get up at night to urinate? 1 Time    ? Total IPSS Score 1    ?  ? Quality of Life due to urinary symptoms  ? If you were to spend the rest of your life with your urinary condition just the way it is now how would you feel about that? Pleased    ? ?  ?  ? ?  ? ? ?  ? ?ROS: ? ?ROS:  ?A complete review of systems was performed.  All systems are negative except for pertinent findings as noted.  ? ?Review of Systems  ?Eyes:  Positive for blurred vision.  ?Gastrointestinal:  Positive for heartburn.  ?Skin:  Positive for itching.  ?Endo/Heme/Allergies:  Positive for polydipsia.  ?Psychiatric/Behavioral:  Positive for depression. The patient is nervous/anxious.   ?All other systems reviewed and are negative. ? ?Allergies  ?Allergen Reactions  ? Lipitor [Atorvastatin] Other (See Comments)  ?  Myalgias  ? Lisinopril Other (See Comments)  ?  Angioedema  ? Peanut-Containing Drug Products Itching  ? Shellfish-Derived Products Itching and Rash  ? Sulfa Antibiotics Itching  ? ? ?Outpatient Encounter Medications as of 03/28/2022  ?Medication Sig Note  ? acetaminophen (TYLENOL) 500 MG tablet Take 500 mg by mouth at bedtime.   ? ALPRAZolam (XANAX) 0.5 MG tablet Take 0.25-0.5 mg by mouth at bedtime as needed for sleep.   ? Ascorbic Acid (VITAMIN C) 1000 MG tablet Take 1,000 mg by mouth daily.   ? aspirin 81 MG EC tablet Take 81 mg by mouth every morning.   ? Calcium Carb-Cholecalciferol (CALCIUM 600+D) 600-800 MG-UNIT TABS Take 1 tablet by mouth every other day. 06/20/2021: On Hold  ? Cholecalciferol (VITAMIN D3) 50 MCG (2000 UT) capsule Take 2,000 Units by mouth daily.   ? Cranberry 500 MG TABS Take 500 mg by mouth every morning.   ? dicyclomine (BENTYL) 20 MG tablet Take 20 mg by mouth 2 (two) times daily as needed for spasms.   ? famotidine (PEPCID) 20 MG tablet Take 20 mg by mouth daily.   ? FEROSUL 325 (65 Fe) MG tablet Take 325  mg by mouth 2 (two) times a week. 06/20/2021: Oh hold  ? FLUoxetine (PROZAC) 20 MG capsule Take 20 mg by mouth daily.   ? folic acid (FOLVITE) 1 MG tablet Take 1 mg by mouth daily.   ? furosemide (LASIX) 20 MG tablet Take 20 mg by mouth 2 (two) times a week. If needed for fluid   ? ketoconazole (NIZORAL) 2 % cream Apply topically 2 (two) times daily.   ? levocetirizine (XYZAL) 5 MG tablet Take 5 mg by mouth daily.   ? levothyroxine (SYNTHROID, LEVOTHROID) 100 MCG tablet Take 100 mcg by mouth daily before breakfast.   ? loratadine (CLARITIN) 10 MG tablet Take 10 mg by mouth daily as needed for allergies.   ? metoprolol succinate (TOPROL-XL) 25 MG 24 hr tablet Take 1 tablet (25 mg total) by mouth 2 (two) times daily.   ? Multiple Vitamins-Minerals (MULTIVITAMIN WITH MINERALS) tablet Take 1 tablet by mouth every other day. Centrum   ? Multiple Vitamins-Minerals (OCUVITE-LUTEIN PO) Take 1 tablet by mouth 2 (two) times a week.   ? nitroGLYCERIN (NITROSTAT) 0.4 MG SL tablet Place 1 tablet (0.4 mg total) under the tongue every 5 (five) minutes as needed for chest pain. (Patient taking differently: Place 0.4 mg under the tongue every 5 (five) minutes x 3 doses as needed for chest pain.)   ? ondansetron (ZOFRAN) 4 MG tablet Take 4 mg by mouth daily as needed for nausea.   ? Polyethyl Glycol-Propyl Glycol (LUBRICANT EYE DROPS) 0.4-0.3 % SOLN Place 1 drop into both eyes 3 (three) times daily as needed (dry/irritated eyes.).   ? polyethylene glycol (MIRALAX / GLYCOLAX) 17 g packet Take 17 g by mouth at bedtime.   ? potassium chloride SA (KLOR-CON) 20 MEQ tablet Take 20 mEq by mouth 2 (two) times a week. As needed with Lasix   ? rivaroxaban (XARELTO) 2.5 MG TABS tablet Take 1 tablet (2.5 mg total) by mouth 2 (two) times daily.   ? rosuvastatin (CRESTOR) 20 MG tablet Take 1 tablet (20 mg total) by mouth daily. (Patient taking differently: Take 20 mg by mouth at bedtime.)   ? tadalafil (CIALIS) 5 MG tablet Take 5 mg by mouth daily  as needed for erectile dysfunction.   ? vitamin E 200 UNIT capsule Take 200 Units by mouth daily.   ? [DISCONTINUED] sildenafil (REVATIO) 20 MG  tablet Take 1 tablet (20 mg total) by mouth daily as needed. (Patient taking differently: Take 20 mg by mouth daily as needed (ED).)   ? [DISCONTINUED] tamsulosin (FLOMAX) 0.4 MG CAPS capsule Take 1 capsule (0.4 mg total) by mouth daily. (Patient taking differently: Take 0.4 mg by mouth in the morning and at bedtime.)   ? esomeprazole (NEXIUM) 40 MG capsule Take 1 capsule (40 mg total) by mouth 2 (two) times daily for 14 days.   ? sildenafil (REVATIO) 20 MG tablet Take 3 tablets (60 mg total) by mouth daily as needed.   ? tamsulosin (FLOMAX) 0.4 MG CAPS capsule Take 1 capsule (0.4 mg total) by mouth daily.   ? ?No facility-administered encounter medications on file as of 03/28/2022.  ? ? ?Past Medical History:  ?Diagnosis Date  ? Arthritis   ? Dyslipidemia   ? GERD (gastroesophageal reflux disease)   ? H/O: CVA (cerebrovascular accident), 06/22/15 Rt frontal rec'd TPA 08/14/2015  ? Heart attack ([place]) 09/24/94  ? Heart disease, unspecified   ? Hypertension   ? Hypothyroidism   ? Hypothyroidism   ? Irritable bowel syndrome   ? Mitral valve disorders(424.0)   ? Obesity   ? Pulmonary valve disorders   ? Stroke [facility])   ? ? ?Past Surgical History:  ?Procedure Laterality Date  ? BIOPSY  06/26/2021  ? Procedure: BIOPSY;  Surgeon: Harvel Quale, MD;  Location: [facility];  Service: Gastroenterology;;  duodenum ?gastric ?esophagus  ? CARDIAC CATHETERIZATION  12/10/94  ? CARDIAC CATHETERIZATION N/A 08/14/2015  ? Procedure: Left Heart Cath and Coronary Angiography;  Surgeon: Sherren Mocha, MD;  Location: [place] CV LAB;  Service: Cardiovascular;  Laterality: N/A;  ? CARDIAC CATHETERIZATION N/A 11/24/2015  ? Procedure: Left Heart Cath and Cors/Grafts Angiography;  Surgeon: Troy Sine, MD;  Location: [place] CV LAB;  Service: Cardiovascular;  Laterality: N/A;  ?  CARDIAC CATHETERIZATION  11/24/2015  ? Procedure: Coronary Stent Intervention;  Surgeon: Troy Sine, MD;  Location: [place] CV LAB;  Service: Cardiovascular;;  ? COLONOSCOPY WITH PROPOFOL N/A 6/28

## 2022-03-29 DIAGNOSIS — I5022 Chronic systolic (congestive) heart failure: Secondary | ICD-10-CM | POA: Diagnosis not present

## 2022-03-29 DIAGNOSIS — K219 Gastro-esophageal reflux disease without esophagitis: Secondary | ICD-10-CM | POA: Diagnosis not present

## 2022-03-29 LAB — URINALYSIS, ROUTINE W REFLEX MICROSCOPIC
Bilirubin, UA: NEGATIVE
Glucose, UA: NEGATIVE
Ketones, UA: NEGATIVE
Leukocytes,UA: NEGATIVE
Nitrite, UA: NEGATIVE
RBC, UA: NEGATIVE
Specific Gravity, UA: 1.025 (ref 1.005–1.030)
Urobilinogen, Ur: 0.2 mg/dL (ref 0.2–1.0)
pH, UA: 5.5 (ref 5.0–7.5)

## 2022-04-01 DIAGNOSIS — Z6824 Body mass index (BMI) 24.0-24.9, adult: Secondary | ICD-10-CM | POA: Diagnosis not present

## 2022-04-01 DIAGNOSIS — I5033 Acute on chronic diastolic (congestive) heart failure: Secondary | ICD-10-CM | POA: Diagnosis not present

## 2022-04-01 DIAGNOSIS — I5022 Chronic systolic (congestive) heart failure: Secondary | ICD-10-CM | POA: Diagnosis not present

## 2022-04-01 DIAGNOSIS — Z0000 Encounter for general adult medical examination without abnormal findings: Secondary | ICD-10-CM | POA: Diagnosis not present

## 2022-04-01 DIAGNOSIS — K219 Gastro-esophageal reflux disease without esophagitis: Secondary | ICD-10-CM | POA: Diagnosis not present

## 2022-04-01 DIAGNOSIS — K581 Irritable bowel syndrome with constipation: Secondary | ICD-10-CM | POA: Diagnosis not present

## 2022-04-04 ENCOUNTER — Ambulatory Visit: Payer: Medicare Other | Admitting: Urology

## 2022-04-29 DIAGNOSIS — J40 Bronchitis, not specified as acute or chronic: Secondary | ICD-10-CM | POA: Diagnosis not present

## 2022-05-05 DIAGNOSIS — R0989 Other specified symptoms and signs involving the circulatory and respiratory systems: Secondary | ICD-10-CM | POA: Diagnosis not present

## 2022-05-05 DIAGNOSIS — Z79899 Other long term (current) drug therapy: Secondary | ICD-10-CM | POA: Diagnosis not present

## 2022-05-05 DIAGNOSIS — Z8673 Personal history of transient ischemic attack (TIA), and cerebral infarction without residual deficits: Secondary | ICD-10-CM | POA: Diagnosis not present

## 2022-05-05 DIAGNOSIS — E785 Hyperlipidemia, unspecified: Secondary | ICD-10-CM | POA: Diagnosis not present

## 2022-05-05 DIAGNOSIS — Z7901 Long term (current) use of anticoagulants: Secondary | ICD-10-CM | POA: Diagnosis not present

## 2022-05-05 DIAGNOSIS — R1012 Left upper quadrant pain: Secondary | ICD-10-CM | POA: Diagnosis not present

## 2022-05-05 DIAGNOSIS — I252 Old myocardial infarction: Secondary | ICD-10-CM | POA: Diagnosis not present

## 2022-05-05 DIAGNOSIS — Z951 Presence of aortocoronary bypass graft: Secondary | ICD-10-CM | POA: Diagnosis not present

## 2022-05-05 DIAGNOSIS — I110 Hypertensive heart disease with heart failure: Secondary | ICD-10-CM | POA: Diagnosis not present

## 2022-05-05 DIAGNOSIS — I2510 Atherosclerotic heart disease of native coronary artery without angina pectoris: Secondary | ICD-10-CM | POA: Diagnosis not present

## 2022-05-05 DIAGNOSIS — Z955 Presence of coronary angioplasty implant and graft: Secondary | ICD-10-CM | POA: Diagnosis not present

## 2022-05-05 DIAGNOSIS — I509 Heart failure, unspecified: Secondary | ICD-10-CM | POA: Diagnosis not present

## 2022-05-05 DIAGNOSIS — R918 Other nonspecific abnormal finding of lung field: Secondary | ICD-10-CM | POA: Diagnosis not present

## 2022-05-05 DIAGNOSIS — R059 Cough, unspecified: Secondary | ICD-10-CM | POA: Diagnosis not present

## 2022-05-24 DIAGNOSIS — J449 Chronic obstructive pulmonary disease, unspecified: Secondary | ICD-10-CM | POA: Diagnosis not present

## 2022-05-24 DIAGNOSIS — Z79899 Other long term (current) drug therapy: Secondary | ICD-10-CM | POA: Diagnosis not present

## 2022-05-24 DIAGNOSIS — Z951 Presence of aortocoronary bypass graft: Secondary | ICD-10-CM | POA: Diagnosis not present

## 2022-05-24 DIAGNOSIS — I509 Heart failure, unspecified: Secondary | ICD-10-CM | POA: Diagnosis not present

## 2022-05-24 DIAGNOSIS — Z882 Allergy status to sulfonamides status: Secondary | ICD-10-CM | POA: Diagnosis not present

## 2022-05-24 DIAGNOSIS — Z8673 Personal history of transient ischemic attack (TIA), and cerebral infarction without residual deficits: Secondary | ICD-10-CM | POA: Diagnosis not present

## 2022-05-24 DIAGNOSIS — I2510 Atherosclerotic heart disease of native coronary artery without angina pectoris: Secondary | ICD-10-CM | POA: Diagnosis not present

## 2022-05-24 DIAGNOSIS — Z9109 Other allergy status, other than to drugs and biological substances: Secondary | ICD-10-CM | POA: Diagnosis not present

## 2022-05-24 DIAGNOSIS — R9431 Abnormal electrocardiogram [ECG] [EKG]: Secondary | ICD-10-CM | POA: Diagnosis not present

## 2022-05-24 DIAGNOSIS — K219 Gastro-esophageal reflux disease without esophagitis: Secondary | ICD-10-CM | POA: Diagnosis not present

## 2022-05-24 DIAGNOSIS — R0602 Shortness of breath: Secondary | ICD-10-CM | POA: Diagnosis not present

## 2022-05-24 DIAGNOSIS — I110 Hypertensive heart disease with heart failure: Secondary | ICD-10-CM | POA: Diagnosis not present

## 2022-05-24 DIAGNOSIS — E785 Hyperlipidemia, unspecified: Secondary | ICD-10-CM | POA: Diagnosis not present

## 2022-05-24 DIAGNOSIS — J40 Bronchitis, not specified as acute or chronic: Secondary | ICD-10-CM | POA: Diagnosis not present

## 2022-05-24 DIAGNOSIS — Z7982 Long term (current) use of aspirin: Secondary | ICD-10-CM | POA: Diagnosis not present

## 2022-05-24 DIAGNOSIS — Z7901 Long term (current) use of anticoagulants: Secondary | ICD-10-CM | POA: Diagnosis not present

## 2022-05-28 DIAGNOSIS — I10 Essential (primary) hypertension: Secondary | ICD-10-CM | POA: Diagnosis not present

## 2022-05-28 DIAGNOSIS — I5022 Chronic systolic (congestive) heart failure: Secondary | ICD-10-CM | POA: Diagnosis not present

## 2022-05-28 DIAGNOSIS — E785 Hyperlipidemia, unspecified: Secondary | ICD-10-CM | POA: Diagnosis not present

## 2022-05-28 DIAGNOSIS — I639 Cerebral infarction, unspecified: Secondary | ICD-10-CM | POA: Diagnosis not present

## 2022-05-28 DIAGNOSIS — I2510 Atherosclerotic heart disease of native coronary artery without angina pectoris: Secondary | ICD-10-CM | POA: Diagnosis not present

## 2022-05-29 DIAGNOSIS — K580 Irritable bowel syndrome with diarrhea: Secondary | ICD-10-CM | POA: Diagnosis not present

## 2022-05-29 DIAGNOSIS — K219 Gastro-esophageal reflux disease without esophagitis: Secondary | ICD-10-CM | POA: Diagnosis not present

## 2022-05-29 DIAGNOSIS — I5022 Chronic systolic (congestive) heart failure: Secondary | ICD-10-CM | POA: Diagnosis not present

## 2022-06-04 DIAGNOSIS — I5022 Chronic systolic (congestive) heart failure: Secondary | ICD-10-CM | POA: Diagnosis not present

## 2022-06-07 DIAGNOSIS — I2510 Atherosclerotic heart disease of native coronary artery without angina pectoris: Secondary | ICD-10-CM | POA: Diagnosis not present

## 2022-06-07 DIAGNOSIS — I10 Essential (primary) hypertension: Secondary | ICD-10-CM | POA: Diagnosis not present

## 2022-06-07 DIAGNOSIS — I5022 Chronic systolic (congestive) heart failure: Secondary | ICD-10-CM | POA: Diagnosis not present

## 2022-07-22 DIAGNOSIS — I739 Peripheral vascular disease, unspecified: Secondary | ICD-10-CM | POA: Diagnosis not present

## 2022-07-22 DIAGNOSIS — I5022 Chronic systolic (congestive) heart failure: Secondary | ICD-10-CM | POA: Diagnosis not present

## 2022-08-29 DIAGNOSIS — I739 Peripheral vascular disease, unspecified: Secondary | ICD-10-CM | POA: Diagnosis not present

## 2022-09-09 DIAGNOSIS — K219 Gastro-esophageal reflux disease without esophagitis: Secondary | ICD-10-CM | POA: Diagnosis not present

## 2022-09-09 DIAGNOSIS — I10 Essential (primary) hypertension: Secondary | ICD-10-CM | POA: Diagnosis not present

## 2022-09-09 DIAGNOSIS — K581 Irritable bowel syndrome with constipation: Secondary | ICD-10-CM | POA: Diagnosis not present

## 2022-09-09 DIAGNOSIS — Z6824 Body mass index (BMI) 24.0-24.9, adult: Secondary | ICD-10-CM | POA: Diagnosis not present

## 2022-09-09 DIAGNOSIS — E7849 Other hyperlipidemia: Secondary | ICD-10-CM | POA: Diagnosis not present

## 2022-09-13 DIAGNOSIS — I5022 Chronic systolic (congestive) heart failure: Secondary | ICD-10-CM | POA: Diagnosis not present

## 2022-09-13 DIAGNOSIS — R944 Abnormal results of kidney function studies: Secondary | ICD-10-CM | POA: Diagnosis not present

## 2022-09-13 DIAGNOSIS — I2510 Atherosclerotic heart disease of native coronary artery without angina pectoris: Secondary | ICD-10-CM | POA: Diagnosis not present

## 2022-10-29 DIAGNOSIS — I5022 Chronic systolic (congestive) heart failure: Secondary | ICD-10-CM | POA: Diagnosis not present

## 2022-10-29 DIAGNOSIS — Z01810 Encounter for preprocedural cardiovascular examination: Secondary | ICD-10-CM | POA: Diagnosis not present

## 2022-11-05 DIAGNOSIS — I2510 Atherosclerotic heart disease of native coronary artery without angina pectoris: Secondary | ICD-10-CM | POA: Diagnosis not present

## 2022-11-05 DIAGNOSIS — I255 Ischemic cardiomyopathy: Secondary | ICD-10-CM | POA: Diagnosis not present

## 2022-11-08 DIAGNOSIS — Z01818 Encounter for other preprocedural examination: Secondary | ICD-10-CM | POA: Diagnosis not present

## 2022-11-12 DIAGNOSIS — I2510 Atherosclerotic heart disease of native coronary artery without angina pectoris: Secondary | ICD-10-CM | POA: Diagnosis not present

## 2022-11-12 DIAGNOSIS — E119 Type 2 diabetes mellitus without complications: Secondary | ICD-10-CM | POA: Diagnosis not present

## 2022-11-12 DIAGNOSIS — E079 Disorder of thyroid, unspecified: Secondary | ICD-10-CM | POA: Diagnosis not present

## 2022-11-12 DIAGNOSIS — I447 Left bundle-branch block, unspecified: Secondary | ICD-10-CM | POA: Diagnosis not present

## 2022-11-12 DIAGNOSIS — Z79899 Other long term (current) drug therapy: Secondary | ICD-10-CM | POA: Diagnosis not present

## 2022-11-12 DIAGNOSIS — Z7901 Long term (current) use of anticoagulants: Secondary | ICD-10-CM | POA: Diagnosis not present

## 2022-11-12 DIAGNOSIS — I110 Hypertensive heart disease with heart failure: Secondary | ICD-10-CM | POA: Diagnosis not present

## 2022-11-12 DIAGNOSIS — I255 Ischemic cardiomyopathy: Secondary | ICD-10-CM | POA: Diagnosis not present

## 2022-11-12 DIAGNOSIS — I5022 Chronic systolic (congestive) heart failure: Secondary | ICD-10-CM | POA: Diagnosis not present

## 2022-11-12 DIAGNOSIS — Z95810 Presence of automatic (implantable) cardiac defibrillator: Secondary | ICD-10-CM | POA: Diagnosis not present

## 2022-11-12 DIAGNOSIS — Z7984 Long term (current) use of oral hypoglycemic drugs: Secondary | ICD-10-CM | POA: Diagnosis not present

## 2022-11-12 DIAGNOSIS — Z7982 Long term (current) use of aspirin: Secondary | ICD-10-CM | POA: Diagnosis not present

## 2022-11-12 DIAGNOSIS — E785 Hyperlipidemia, unspecified: Secondary | ICD-10-CM | POA: Diagnosis not present

## 2022-11-25 DIAGNOSIS — N1831 Chronic kidney disease, stage 3a: Secondary | ICD-10-CM | POA: Diagnosis not present

## 2022-11-25 DIAGNOSIS — K581 Irritable bowel syndrome with constipation: Secondary | ICD-10-CM | POA: Diagnosis not present

## 2022-11-25 DIAGNOSIS — I10 Essential (primary) hypertension: Secondary | ICD-10-CM | POA: Diagnosis not present

## 2022-11-25 DIAGNOSIS — K219 Gastro-esophageal reflux disease without esophagitis: Secondary | ICD-10-CM | POA: Diagnosis not present

## 2022-11-25 DIAGNOSIS — E7841 Elevated Lipoprotein(a): Secondary | ICD-10-CM | POA: Diagnosis not present

## 2023-01-06 DIAGNOSIS — I5022 Chronic systolic (congestive) heart failure: Secondary | ICD-10-CM | POA: Diagnosis not present

## 2023-03-14 DIAGNOSIS — I5022 Chronic systolic (congestive) heart failure: Secondary | ICD-10-CM | POA: Diagnosis not present

## 2023-03-19 DIAGNOSIS — N1831 Chronic kidney disease, stage 3a: Secondary | ICD-10-CM | POA: Diagnosis not present

## 2023-03-19 DIAGNOSIS — I10 Essential (primary) hypertension: Secondary | ICD-10-CM | POA: Diagnosis not present

## 2023-03-19 DIAGNOSIS — K581 Irritable bowel syndrome with constipation: Secondary | ICD-10-CM | POA: Diagnosis not present

## 2023-03-19 DIAGNOSIS — I5022 Chronic systolic (congestive) heart failure: Secondary | ICD-10-CM | POA: Diagnosis not present

## 2023-03-19 DIAGNOSIS — E7841 Elevated Lipoprotein(a): Secondary | ICD-10-CM | POA: Diagnosis not present

## 2023-03-19 DIAGNOSIS — K219 Gastro-esophageal reflux disease without esophagitis: Secondary | ICD-10-CM | POA: Diagnosis not present

## 2023-03-19 DIAGNOSIS — Z0000 Encounter for general adult medical examination without abnormal findings: Secondary | ICD-10-CM | POA: Diagnosis not present

## 2023-03-21 DIAGNOSIS — Z0000 Encounter for general adult medical examination without abnormal findings: Secondary | ICD-10-CM | POA: Diagnosis not present

## 2023-03-21 DIAGNOSIS — I10 Essential (primary) hypertension: Secondary | ICD-10-CM | POA: Diagnosis not present

## 2023-03-21 DIAGNOSIS — N1831 Chronic kidney disease, stage 3a: Secondary | ICD-10-CM | POA: Diagnosis not present

## 2023-03-21 DIAGNOSIS — I5022 Chronic systolic (congestive) heart failure: Secondary | ICD-10-CM | POA: Diagnosis not present

## 2023-03-27 ENCOUNTER — Ambulatory Visit (INDEPENDENT_AMBULATORY_CARE_PROVIDER_SITE_OTHER): Payer: 59 | Admitting: Urology

## 2023-03-27 VITALS — BP 114/76 | HR 86 | Ht 64.0 in | Wt 152.5 lb

## 2023-03-27 DIAGNOSIS — N138 Other obstructive and reflux uropathy: Secondary | ICD-10-CM | POA: Diagnosis not present

## 2023-03-27 DIAGNOSIS — N5201 Erectile dysfunction due to arterial insufficiency: Secondary | ICD-10-CM | POA: Diagnosis not present

## 2023-03-27 DIAGNOSIS — R351 Nocturia: Secondary | ICD-10-CM | POA: Diagnosis not present

## 2023-03-27 DIAGNOSIS — N401 Enlarged prostate with lower urinary tract symptoms: Secondary | ICD-10-CM

## 2023-03-27 DIAGNOSIS — Z87440 Personal history of urinary (tract) infections: Secondary | ICD-10-CM | POA: Diagnosis not present

## 2023-03-27 DIAGNOSIS — N390 Urinary tract infection, site not specified: Secondary | ICD-10-CM

## 2023-03-27 LAB — URINALYSIS, ROUTINE W REFLEX MICROSCOPIC
Bilirubin, UA: NEGATIVE
Ketones, UA: NEGATIVE
Leukocytes,UA: NEGATIVE
Nitrite, UA: NEGATIVE
RBC, UA: NEGATIVE
Specific Gravity, UA: 1.015 (ref 1.005–1.030)
Urobilinogen, Ur: 0.2 mg/dL (ref 0.2–1.0)
pH, UA: 5.5 (ref 5.0–7.5)

## 2023-03-27 MED ORDER — TAMSULOSIN HCL 0.4 MG PO CAPS: 0.4000 mg | ORAL_CAPSULE | Freq: Every day | ORAL | 3 refills | Status: AC

## 2023-03-27 MED ORDER — SILDENAFIL CITRATE 100 MG PO TABS: 100.0000 mg | ORAL_TABLET | Freq: Every day | ORAL | 11 refills | Status: AC | PRN

## 2023-03-27 NOTE — Progress Notes (Signed)
Subjective:  1. BPH with urinary obstruction   2. Nocturia   3. Erectile dysfunction due to arterial insufficiency   4. Recurrent UTI      03/27/23: Adam Shelton returns today in f/u.   He has a history of ED with success with sildenafil but now requires 100mg  , BPH with BOO on tamsulosin with an IPSS of 5 and his PSA is 1.2 which is minimally increased and a history of UTI's with a clear urine on cranberry and blueberry supplements.  He has CKD with an increase in the Cr of 1.58 from 1.3.   03/28/22: He has had persistent ED. He is using the sildenafil with success.   He is using 60mg  prn.  He carries NTG but doesn't use it and knows to avoid it.    He has BPH with BOO and is doing well on tamsulosin.  His IPSS is 1.   He also has a history of recurrent pyelonephritis.   He remains on  a cranberry supplement and has done well.   His UA is clear but has 2+ protein.   He has had protein on UA's in 2022 as well but a normal Cr.  His PSA was 0.9 last year and he reports that it is 1.1 this year.   He had  laparoscopic exploration for recurrent SBO at [facility] in 2021 and then again in 7/22.   08/08/17: GU Hx: Adam Shelton is a 72 yo male who is sent in consultation by Dr. Sherrie Sport for recurrent pyelonephritis. He had right pyelonephritis in May with a multidrug resistant e. coli that was initially treated with a cefalosporin followed by macrobid but he required hospitalization for IV antibiotics. He did well but had recurrent symptoms and was admitted on 08/04/17 to [facility] for a fever of 102 and right flank pain. He had flushing and hypertension. He had dysuria on admission. He was discharged today. He was found to have e. coli on blood cultures and is currentlly on parenteral antibiotics for another 5 days. He had a CT in May 2018 that showed no stones or obstruction.         ROS:  ROS:  A complete review of systems was performed.  All systems are negative except for pertinent findings as noted.    Review of Systems  Eyes:  Positive for blurred vision.  Gastrointestinal:  Positive for heartburn.  Skin:  Positive for itching.  Endo/Heme/Allergies:  Positive for polydipsia.  Psychiatric/Behavioral:  Positive for depression. The patient is nervous/anxious.   All other systems reviewed and are negative.   Allergies  Allergen Reactions   Lipitor [Atorvastatin] Other (See Comments)    Myalgias   Lisinopril Other (See Comments)    Angioedema   Peanut-Containing Drug Products Itching   Shellfish-Derived Products Itching and Rash   Sulfa Antibiotics Itching    Outpatient Encounter Medications as of 03/27/2023  Medication Sig Note   acetaminophen (TYLENOL) 500 MG tablet Take 500 mg by mouth at bedtime.    ALPRAZolam (XANAX) 0.5 MG tablet Take 0.25-0.5 mg by mouth at bedtime as needed for sleep.    Ascorbic Acid (VITAMIN C) 1000 MG tablet Take 1,000 mg by mouth daily.    aspirin 81 MG EC tablet Take 81 mg by mouth every morning.    Calcium Carb-Cholecalciferol (CALCIUM 600+D) 600-800 MG-UNIT TABS Take 1 tablet by mouth every other day. 06/20/2021: On Hold   Cholecalciferol (VITAMIN D3) 50 MCG (2000 UT) capsule Take 2,000 Units by mouth daily.  Cranberry 500 MG TABS Take 500 mg by mouth every morning.    dicyclomine (BENTYL) 20 MG tablet Take 20 mg by mouth 2 (two) times daily as needed for spasms.    empagliflozin (JARDIANCE) 10 MG TABS tablet Take 10 mg by mouth daily.    famotidine (PEPCID) 20 MG tablet Take 20 mg by mouth daily.    FEROSUL 325 (65 Fe) MG tablet Take 325 mg by mouth 2 (two) times a week. 06/20/2021: Oh hold   FLUoxetine (PROZAC) 20 MG capsule Take 20 mg by mouth daily.    folic acid (FOLVITE) 1 MG tablet Take 1 mg by mouth daily.    furosemide (LASIX) 20 MG tablet Take 20 mg by mouth 2 (two) times a week. If needed for fluid    ketoconazole (NIZORAL) 2 % cream Apply topically 2 (two) times daily.    levocetirizine (XYZAL) 5 MG tablet Take 5 mg by mouth daily.     levothyroxine (SYNTHROID, LEVOTHROID) 100 MCG tablet Take 100 mcg by mouth daily before breakfast.    metoprolol succinate (TOPROL-XL) 25 MG 24 hr tablet Take 1 tablet (25 mg total) by mouth 2 (two) times daily.    Multiple Vitamins-Minerals (MULTIVITAMIN WITH MINERALS) tablet Take 1 tablet by mouth every other day. Centrum    Multiple Vitamins-Minerals (OCUVITE-LUTEIN PO) Take 1 tablet by mouth 2 (two) times a week.    nitroGLYCERIN (NITROSTAT) 0.4 MG SL tablet Place 1 tablet (0.4 mg total) under the tongue every 5 (five) minutes as needed for chest pain. (Patient taking differently: Place 0.4 mg under the tongue every 5 (five) minutes x 3 doses as needed for chest pain.)    ondansetron (ZOFRAN) 4 MG tablet Take 4 mg by mouth daily as needed for nausea.    Polyethyl Glycol-Propyl Glycol (LUBRICANT EYE DROPS) 0.4-0.3 % SOLN Place 1 drop into both eyes 3 (three) times daily as needed (dry/irritated eyes.).    polyethylene glycol (MIRALAX / GLYCOLAX) 17 g packet Take 17 g by mouth at bedtime.    potassium chloride SA (KLOR-CON) 20 MEQ tablet Take 20 mEq by mouth 2 (two) times a week. As needed with Lasix    rivaroxaban (XARELTO) 2.5 MG TABS tablet Take 1 tablet (2.5 mg total) by mouth 2 (two) times daily.    rosuvastatin (CRESTOR) 20 MG tablet Take 1 tablet (20 mg total) by mouth daily. (Patient taking differently: Take 20 mg by mouth at bedtime.)    sacubitril-valsartan (ENTRESTO) 24-26 MG Take 1 tablet by mouth 2 (two) times daily.    sildenafil (VIAGRA) 100 MG tablet Take 1 tablet (100 mg total) by mouth daily as needed for erectile dysfunction.    spironolactone (ALDACTONE) 25 MG tablet Take 12.5 mg by mouth daily.    vitamin E 200 UNIT capsule Take 200 Units by mouth daily.    [DISCONTINUED] sildenafil (REVATIO) 20 MG tablet Take 3 tablets (60 mg total) by mouth daily as needed.    [DISCONTINUED] tadalafil (CIALIS) 5 MG tablet Take 5 mg by mouth daily as needed for erectile dysfunction.     [DISCONTINUED] tamsulosin (FLOMAX) 0.4 MG CAPS capsule Take 1 capsule (0.4 mg total) by mouth daily.    esomeprazole (NEXIUM) 40 MG capsule Take 1 capsule (40 mg total) by mouth 2 (two) times daily for 14 days.    tamsulosin (FLOMAX) 0.4 MG CAPS capsule Take 1 capsule (0.4 mg total) by mouth daily.    [DISCONTINUED] loratadine (CLARITIN) 10 MG tablet Take 10 mg by mouth  daily as needed for allergies. (Patient not taking: Reported on 03/27/2023)    No facility-administered encounter medications on file as of 03/27/2023.    Past Medical History:  Diagnosis Date   Arthritis    Dyslipidemia    GERD (gastroesophageal reflux disease)    H/O: CVA (cerebrovascular accident), 06/22/15 Rt frontal rec'd TPA 08/14/2015   Heart attack ([place]) 09/24/94   Heart disease, unspecified    Hypertension    Hypothyroidism    Hypothyroidism    Irritable bowel syndrome    Mitral valve disorders(424.0)    Obesity    Pulmonary valve disorders    Stroke [facility])     Past Surgical History:  Procedure Laterality Date   BIOPSY  06/26/2021   Procedure: BIOPSY;  Surgeon: Harvel Quale, MD;  [facility];  Service: Gastroenterology;;  duodenum gastric esophagus   CARDIAC CATHETERIZATION  12/10/94   CARDIAC CATHETERIZATION N/A 08/14/2015   Procedure: Left Heart Cath and Coronary Angiography;  Surgeon: Sherren Mocha, MD;  Location: [place] [facility];  Service: Cardiovascular;  Laterality: N/A;   CARDIAC CATHETERIZATION N/A 11/24/2015   Procedure: Left Heart Cath and Cors/Grafts Angiography;  Surgeon: Troy Sine, MD;  Location: [place] CV [facility];  Service: Cardiovascular;  Laterality: N/A;   CARDIAC CATHETERIZATION  11/24/2015   Procedure: Coronary Stent Intervention;  Surgeon: Troy Sine, MD;  Location: [place] [facility];  Service: Cardiovascular;;   COLONOSCOPY WITH PROPOFOL N/A 06/26/2021   Procedure: COLONOSCOPY WITH PROPOFOL;  Surgeon: Harvel Quale, MD;  [facility];  Service: Gastroenterology;  Laterality: N/A;  1:30   CORONARY ARTERY BYPASS GRAFT N/A 08/18/2015   Procedure: CORONARY ARTERY BYPASS GRAFTING (CABG)  X 5 UTILIZING THE LEFT INTERNAL MAMMARY ARTERY AND ENDOSCOPICALLY HARVESTED RIGHT AND LEFT SAPHENEOUS VEINS.;  Surgeon: Melrose Nakayama, MD;  Location: [place];  Service: Open Heart Surgery;  Laterality: N/A;   ESOPHAGOGASTRODUODENOSCOPY (EGD) WITH PROPOFOL N/A 06/26/2021   Procedure: ESOPHAGOGASTRODUODENOSCOPY (EGD) WITH PROPOFOL;  Surgeon: Harvel Quale, MD;  [facility];  Service: Gastroenterology;  Laterality: N/A;   HEMOSTASIS CLIP PLACEMENT  06/26/2021   Procedure: HEMOSTASIS CLIP PLACEMENT;  Surgeon: Harvel Quale, MD;  [facility];  Service: Gastroenterology;;   HERNIA REPAIR     age 59   POLYPECTOMY  06/26/2021   Procedure: POLYPECTOMY INTESTINAL;  Surgeon: Harvel Quale, MD;  [facility];  Service: Gastroenterology;;   TEE WITHOUT CARDIOVERSION N/A 08/18/2015   Procedure: TRANSESOPHAGEAL ECHOCARDIOGRAM (TEE);  Surgeon: Melrose Nakayama, MD;  Location: [place];  Service: Open Heart Surgery;  Laterality: N/A;    Social History   Socioeconomic History   Marital status: Married    Spouse name: Not on file   Number of children: Not on file   Years of education: Not on file   Highest education level: Not on file  Occupational History   Not on file  Tobacco Use   Smoking status: Never   Smokeless tobacco: Never  Vaping Use   Vaping Use: Never used  Substance and Sexual Activity   Alcohol use: No    Alcohol/week: 0.0 standard drinks of alcohol   Drug use: No   Sexual activity: Yes    Partners: Female  Other Topics Concern   Not on file  Social History Narrative   Retired   Has 3 children   Married to [employer]   Regular exercise--yes 5 days/week 28min   Social  Determinants of Health   Financial Resource Strain: Not on file  Food  Insecurity: Not on file  Transportation Needs: Not on file  Physical Activity: Not on file  Stress: Not on file  Social Connections: Not on file  Intimate Partner Violence: Not on file    Family History  Problem Relation Age of Onset   Kidney disease Father        died age 86   Heart disease Mother        had angioplasty       Objective: BP 114/76   Pulse 86   Ht 5\' 4"  (1.626 m)   Wt 152 lb 8 oz (69.2 kg)   BMI 26.18 kg/m    Physical Exam Vitals reviewed.  Constitutional:      Appearance: Normal appearance.  Genitourinary:    Comments: Prostate is 2+ benign. SV non-palpable.  Neurological:     Mental Status: He is alert.     Lab Results:  No results found for this or any previous visit (from the past 24 hour(s)).    BMET No results for input(s): "NA", "K", "CL", "CO2", "GLUCOSE", "BUN", "CREATININE", "CALCIUM" in the last 72 hours. PSA No results found for: "PSA" No results found for: "TESTOSTERONE"  UA is unremarkable.  Studies/Results: No results found.    Assessment & Plan: History of recurrent UTI's but is doing well without recurrences.  BPH and BOO.  I have refilled the tamsulosin.  PSA is minimally increased.  Dr. Sherrie Sport is drawing that annually.   ED.  I have refilled the sildenafil.     Meds ordered this encounter  Medications   tamsulosin (FLOMAX) 0.4 MG CAPS capsule    Sig: Take 1 capsule (0.4 mg total) by mouth daily.    Dispense:  90 capsule    Refill:  3   sildenafil (VIAGRA) 100 MG tablet    Sig: Take 1 tablet (100 mg total) by mouth daily as needed for erectile dysfunction.    Dispense:  10 tablet    Refill:  11     Orders Placed This Encounter  Procedures   Urinalysis, Routine w reflex microscopic      Return in about 1 year (around 03/26/2024).   CC: Neale Burly, MD      Irine Seal 03/28/2023

## 2023-03-28 ENCOUNTER — Encounter: Payer: Self-pay | Admitting: Urology

## 2023-04-21 DIAGNOSIS — H3552 Pigmentary retinal dystrophy: Secondary | ICD-10-CM | POA: Diagnosis not present

## 2023-04-25 DIAGNOSIS — I5022 Chronic systolic (congestive) heart failure: Secondary | ICD-10-CM | POA: Diagnosis not present

## 2023-04-25 DIAGNOSIS — I340 Nonrheumatic mitral (valve) insufficiency: Secondary | ICD-10-CM | POA: Diagnosis not present

## 2023-05-08 DIAGNOSIS — M5459 Other low back pain: Secondary | ICD-10-CM | POA: Diagnosis not present

## 2023-05-08 DIAGNOSIS — I10 Essential (primary) hypertension: Secondary | ICD-10-CM | POA: Diagnosis not present

## 2023-05-09 DIAGNOSIS — E7849 Other hyperlipidemia: Secondary | ICD-10-CM | POA: Diagnosis not present

## 2023-05-09 DIAGNOSIS — K219 Gastro-esophageal reflux disease without esophagitis: Secondary | ICD-10-CM | POA: Diagnosis not present

## 2023-05-09 DIAGNOSIS — I5022 Chronic systolic (congestive) heart failure: Secondary | ICD-10-CM | POA: Diagnosis not present

## 2023-05-09 DIAGNOSIS — Z131 Encounter for screening for diabetes mellitus: Secondary | ICD-10-CM | POA: Diagnosis not present

## 2023-05-09 DIAGNOSIS — N1831 Chronic kidney disease, stage 3a: Secondary | ICD-10-CM | POA: Diagnosis not present

## 2023-05-24 DIAGNOSIS — I5022 Chronic systolic (congestive) heart failure: Secondary | ICD-10-CM | POA: Diagnosis not present

## 2023-05-24 DIAGNOSIS — Z95810 Presence of automatic (implantable) cardiac defibrillator: Secondary | ICD-10-CM | POA: Diagnosis not present

## 2023-07-09 DIAGNOSIS — I5022 Chronic systolic (congestive) heart failure: Secondary | ICD-10-CM | POA: Diagnosis not present

## 2023-07-09 DIAGNOSIS — E7841 Elevated Lipoprotein(a): Secondary | ICD-10-CM | POA: Diagnosis not present

## 2023-07-09 DIAGNOSIS — Z0000 Encounter for general adult medical examination without abnormal findings: Secondary | ICD-10-CM | POA: Diagnosis not present

## 2023-07-09 DIAGNOSIS — M5459 Other low back pain: Secondary | ICD-10-CM | POA: Diagnosis not present

## 2023-07-09 DIAGNOSIS — K219 Gastro-esophageal reflux disease without esophagitis: Secondary | ICD-10-CM | POA: Diagnosis not present

## 2023-07-09 DIAGNOSIS — I10 Essential (primary) hypertension: Secondary | ICD-10-CM | POA: Diagnosis not present

## 2023-07-09 DIAGNOSIS — N1831 Chronic kidney disease, stage 3a: Secondary | ICD-10-CM | POA: Diagnosis not present

## 2023-07-09 DIAGNOSIS — K581 Irritable bowel syndrome with constipation: Secondary | ICD-10-CM | POA: Diagnosis not present

## 2023-08-15 DIAGNOSIS — I5022 Chronic systolic (congestive) heart failure: Secondary | ICD-10-CM | POA: Diagnosis not present

## 2023-08-20 DIAGNOSIS — Z95810 Presence of automatic (implantable) cardiac defibrillator: Secondary | ICD-10-CM | POA: Diagnosis not present

## 2023-08-20 DIAGNOSIS — I5022 Chronic systolic (congestive) heart failure: Secondary | ICD-10-CM | POA: Diagnosis not present

## 2023-08-23 DIAGNOSIS — Z95810 Presence of automatic (implantable) cardiac defibrillator: Secondary | ICD-10-CM | POA: Diagnosis not present

## 2023-08-23 DIAGNOSIS — I5022 Chronic systolic (congestive) heart failure: Secondary | ICD-10-CM | POA: Diagnosis not present

## 2023-09-24 DIAGNOSIS — I5022 Chronic systolic (congestive) heart failure: Secondary | ICD-10-CM | POA: Diagnosis not present

## 2023-10-09 DIAGNOSIS — M5459 Other low back pain: Secondary | ICD-10-CM | POA: Diagnosis not present

## 2023-10-09 DIAGNOSIS — I5022 Chronic systolic (congestive) heart failure: Secondary | ICD-10-CM | POA: Diagnosis not present

## 2023-10-09 DIAGNOSIS — K219 Gastro-esophageal reflux disease without esophagitis: Secondary | ICD-10-CM | POA: Diagnosis not present

## 2023-10-09 DIAGNOSIS — K581 Irritable bowel syndrome with constipation: Secondary | ICD-10-CM | POA: Diagnosis not present

## 2023-10-09 DIAGNOSIS — E7841 Elevated Lipoprotein(a): Secondary | ICD-10-CM | POA: Diagnosis not present

## 2023-10-09 DIAGNOSIS — I10 Essential (primary) hypertension: Secondary | ICD-10-CM | POA: Diagnosis not present

## 2023-10-09 DIAGNOSIS — N1831 Chronic kidney disease, stage 3a: Secondary | ICD-10-CM | POA: Diagnosis not present

## 2023-10-10 DIAGNOSIS — E785 Hyperlipidemia, unspecified: Secondary | ICD-10-CM | POA: Diagnosis not present

## 2023-10-10 DIAGNOSIS — E038 Other specified hypothyroidism: Secondary | ICD-10-CM | POA: Diagnosis not present

## 2023-10-10 DIAGNOSIS — E0500 Thyrotoxicosis with diffuse goiter without thyrotoxic crisis or storm: Secondary | ICD-10-CM | POA: Diagnosis not present

## 2023-10-10 DIAGNOSIS — Z794 Long term (current) use of insulin: Secondary | ICD-10-CM | POA: Diagnosis not present

## 2023-10-10 DIAGNOSIS — Z79899 Other long term (current) drug therapy: Secondary | ICD-10-CM | POA: Diagnosis not present

## 2023-10-10 DIAGNOSIS — R5383 Other fatigue: Secondary | ICD-10-CM | POA: Diagnosis not present

## 2023-10-10 DIAGNOSIS — E113599 Type 2 diabetes mellitus with proliferative diabetic retinopathy without macular edema, unspecified eye: Secondary | ICD-10-CM | POA: Diagnosis not present

## 2023-11-14 DIAGNOSIS — I340 Nonrheumatic mitral (valve) insufficiency: Secondary | ICD-10-CM | POA: Diagnosis not present

## 2023-11-14 DIAGNOSIS — I5022 Chronic systolic (congestive) heart failure: Secondary | ICD-10-CM | POA: Diagnosis not present

## 2023-11-14 DIAGNOSIS — I361 Nonrheumatic tricuspid (valve) insufficiency: Secondary | ICD-10-CM | POA: Diagnosis not present

## 2023-11-20 DIAGNOSIS — Z95810 Presence of automatic (implantable) cardiac defibrillator: Secondary | ICD-10-CM | POA: Diagnosis not present

## 2023-11-20 DIAGNOSIS — I5022 Chronic systolic (congestive) heart failure: Secondary | ICD-10-CM | POA: Diagnosis not present

## 2023-11-22 DIAGNOSIS — Z95810 Presence of automatic (implantable) cardiac defibrillator: Secondary | ICD-10-CM | POA: Diagnosis not present

## 2023-11-22 DIAGNOSIS — I5022 Chronic systolic (congestive) heart failure: Secondary | ICD-10-CM | POA: Diagnosis not present

## 2023-12-18 DIAGNOSIS — I5022 Chronic systolic (congestive) heart failure: Secondary | ICD-10-CM | POA: Diagnosis not present

## 2023-12-27 DIAGNOSIS — Z79899 Other long term (current) drug therapy: Secondary | ICD-10-CM | POA: Diagnosis not present

## 2023-12-27 DIAGNOSIS — M4802 Spinal stenosis, cervical region: Secondary | ICD-10-CM | POA: Diagnosis not present

## 2023-12-27 DIAGNOSIS — J9811 Atelectasis: Secondary | ICD-10-CM | POA: Diagnosis not present

## 2023-12-27 DIAGNOSIS — Z7901 Long term (current) use of anticoagulants: Secondary | ICD-10-CM | POA: Diagnosis not present

## 2023-12-27 DIAGNOSIS — I110 Hypertensive heart disease with heart failure: Secondary | ICD-10-CM | POA: Diagnosis not present

## 2023-12-27 DIAGNOSIS — Z950 Presence of cardiac pacemaker: Secondary | ICD-10-CM | POA: Diagnosis not present

## 2023-12-27 DIAGNOSIS — Z882 Allergy status to sulfonamides status: Secondary | ICD-10-CM | POA: Diagnosis not present

## 2023-12-27 DIAGNOSIS — E785 Hyperlipidemia, unspecified: Secondary | ICD-10-CM | POA: Diagnosis not present

## 2023-12-27 DIAGNOSIS — Z7985 Long-term (current) use of injectable non-insulin antidiabetic drugs: Secondary | ICD-10-CM | POA: Diagnosis not present

## 2023-12-27 DIAGNOSIS — M47812 Spondylosis without myelopathy or radiculopathy, cervical region: Secondary | ICD-10-CM | POA: Diagnosis not present

## 2023-12-27 DIAGNOSIS — R079 Chest pain, unspecified: Secondary | ICD-10-CM | POA: Diagnosis not present

## 2023-12-27 DIAGNOSIS — Z7982 Long term (current) use of aspirin: Secondary | ICD-10-CM | POA: Diagnosis not present

## 2023-12-27 DIAGNOSIS — E079 Disorder of thyroid, unspecified: Secondary | ICD-10-CM | POA: Diagnosis not present

## 2023-12-27 DIAGNOSIS — R0602 Shortness of breath: Secondary | ICD-10-CM | POA: Diagnosis not present

## 2023-12-27 DIAGNOSIS — R059 Cough, unspecified: Secondary | ICD-10-CM | POA: Diagnosis not present

## 2023-12-27 DIAGNOSIS — J439 Emphysema, unspecified: Secondary | ICD-10-CM | POA: Diagnosis not present

## 2023-12-27 DIAGNOSIS — R9431 Abnormal electrocardiogram [ECG] [EKG]: Secondary | ICD-10-CM | POA: Diagnosis not present

## 2023-12-27 DIAGNOSIS — I6523 Occlusion and stenosis of bilateral carotid arteries: Secondary | ICD-10-CM | POA: Diagnosis not present

## 2023-12-27 DIAGNOSIS — Z955 Presence of coronary angioplasty implant and graft: Secondary | ICD-10-CM | POA: Diagnosis not present

## 2023-12-27 DIAGNOSIS — I2510 Atherosclerotic heart disease of native coronary artery without angina pectoris: Secondary | ICD-10-CM | POA: Diagnosis not present

## 2023-12-27 DIAGNOSIS — I5022 Chronic systolic (congestive) heart failure: Secondary | ICD-10-CM | POA: Diagnosis not present

## 2023-12-28 DIAGNOSIS — R059 Cough, unspecified: Secondary | ICD-10-CM | POA: Diagnosis not present

## 2024-01-06 DIAGNOSIS — R9431 Abnormal electrocardiogram [ECG] [EKG]: Secondary | ICD-10-CM | POA: Diagnosis not present

## 2024-01-06 DIAGNOSIS — I2510 Atherosclerotic heart disease of native coronary artery without angina pectoris: Secondary | ICD-10-CM | POA: Diagnosis not present

## 2024-01-06 DIAGNOSIS — E785 Hyperlipidemia, unspecified: Secondary | ICD-10-CM | POA: Diagnosis not present

## 2024-01-06 DIAGNOSIS — I10 Essential (primary) hypertension: Secondary | ICD-10-CM | POA: Diagnosis not present

## 2024-01-06 DIAGNOSIS — I5022 Chronic systolic (congestive) heart failure: Secondary | ICD-10-CM | POA: Diagnosis not present

## 2024-01-08 DIAGNOSIS — I5022 Chronic systolic (congestive) heart failure: Secondary | ICD-10-CM | POA: Diagnosis not present

## 2024-01-08 DIAGNOSIS — N1831 Chronic kidney disease, stage 3a: Secondary | ICD-10-CM | POA: Diagnosis not present

## 2024-01-08 DIAGNOSIS — I10 Essential (primary) hypertension: Secondary | ICD-10-CM | POA: Diagnosis not present

## 2024-01-08 DIAGNOSIS — E7841 Elevated Lipoprotein(a): Secondary | ICD-10-CM | POA: Diagnosis not present

## 2024-01-08 DIAGNOSIS — K581 Irritable bowel syndrome with constipation: Secondary | ICD-10-CM | POA: Diagnosis not present

## 2024-01-08 DIAGNOSIS — M5459 Other low back pain: Secondary | ICD-10-CM | POA: Diagnosis not present

## 2024-01-08 DIAGNOSIS — K219 Gastro-esophageal reflux disease without esophagitis: Secondary | ICD-10-CM | POA: Diagnosis not present

## 2024-01-09 DIAGNOSIS — I5022 Chronic systolic (congestive) heart failure: Secondary | ICD-10-CM | POA: Diagnosis not present

## 2024-01-09 DIAGNOSIS — K581 Irritable bowel syndrome with constipation: Secondary | ICD-10-CM | POA: Diagnosis not present

## 2024-01-09 DIAGNOSIS — M5459 Other low back pain: Secondary | ICD-10-CM | POA: Diagnosis not present

## 2024-01-09 DIAGNOSIS — I10 Essential (primary) hypertension: Secondary | ICD-10-CM | POA: Diagnosis not present

## 2024-01-09 DIAGNOSIS — N1831 Chronic kidney disease, stage 3a: Secondary | ICD-10-CM | POA: Diagnosis not present

## 2024-02-21 DIAGNOSIS — Z95810 Presence of automatic (implantable) cardiac defibrillator: Secondary | ICD-10-CM | POA: Diagnosis not present

## 2024-02-21 DIAGNOSIS — I5022 Chronic systolic (congestive) heart failure: Secondary | ICD-10-CM | POA: Diagnosis not present

## 2024-05-13 DIAGNOSIS — Z0000 Encounter for general adult medical examination without abnormal findings: Secondary | ICD-10-CM | POA: Diagnosis not present

## 2024-05-13 DIAGNOSIS — I10 Essential (primary) hypertension: Secondary | ICD-10-CM | POA: Diagnosis not present

## 2024-05-13 DIAGNOSIS — N1832 Chronic kidney disease, stage 3b: Secondary | ICD-10-CM | POA: Diagnosis not present

## 2024-05-13 DIAGNOSIS — I7389 Other specified peripheral vascular diseases: Secondary | ICD-10-CM | POA: Diagnosis not present

## 2024-05-13 DIAGNOSIS — E7841 Elevated Lipoprotein(a): Secondary | ICD-10-CM | POA: Diagnosis not present

## 2024-05-13 DIAGNOSIS — K581 Irritable bowel syndrome with constipation: Secondary | ICD-10-CM | POA: Diagnosis not present

## 2024-05-13 DIAGNOSIS — K219 Gastro-esophageal reflux disease without esophagitis: Secondary | ICD-10-CM | POA: Diagnosis not present

## 2024-05-13 DIAGNOSIS — M5459 Other low back pain: Secondary | ICD-10-CM | POA: Diagnosis not present

## 2024-05-13 DIAGNOSIS — I5022 Chronic systolic (congestive) heart failure: Secondary | ICD-10-CM | POA: Diagnosis not present

## 2024-05-14 DIAGNOSIS — E7849 Other hyperlipidemia: Secondary | ICD-10-CM | POA: Diagnosis not present

## 2024-05-14 DIAGNOSIS — Z0000 Encounter for general adult medical examination without abnormal findings: Secondary | ICD-10-CM | POA: Diagnosis not present

## 2024-05-14 DIAGNOSIS — I5022 Chronic systolic (congestive) heart failure: Secondary | ICD-10-CM | POA: Diagnosis not present

## 2024-05-14 DIAGNOSIS — K219 Gastro-esophageal reflux disease without esophagitis: Secondary | ICD-10-CM | POA: Diagnosis not present

## 2024-05-14 DIAGNOSIS — I10 Essential (primary) hypertension: Secondary | ICD-10-CM | POA: Diagnosis not present

## 2024-05-14 DIAGNOSIS — K581 Irritable bowel syndrome with constipation: Secondary | ICD-10-CM | POA: Diagnosis not present

## 2024-05-20 DIAGNOSIS — Z95810 Presence of automatic (implantable) cardiac defibrillator: Secondary | ICD-10-CM | POA: Diagnosis not present

## 2024-05-20 DIAGNOSIS — I5022 Chronic systolic (congestive) heart failure: Secondary | ICD-10-CM | POA: Diagnosis not present

## 2024-05-22 DIAGNOSIS — Z95810 Presence of automatic (implantable) cardiac defibrillator: Secondary | ICD-10-CM | POA: Diagnosis not present

## 2024-05-22 DIAGNOSIS — I5022 Chronic systolic (congestive) heart failure: Secondary | ICD-10-CM | POA: Diagnosis not present

## 2024-06-04 ENCOUNTER — Encounter (INDEPENDENT_AMBULATORY_CARE_PROVIDER_SITE_OTHER): Payer: Self-pay | Admitting: *Deleted

## 2024-06-22 DIAGNOSIS — I110 Hypertensive heart disease with heart failure: Secondary | ICD-10-CM | POA: Diagnosis not present

## 2024-06-22 DIAGNOSIS — B348 Other viral infections of unspecified site: Secondary | ICD-10-CM | POA: Diagnosis not present

## 2024-06-22 DIAGNOSIS — Z7901 Long term (current) use of anticoagulants: Secondary | ICD-10-CM | POA: Diagnosis not present

## 2024-06-22 DIAGNOSIS — R058 Other specified cough: Secondary | ICD-10-CM | POA: Diagnosis not present

## 2024-06-22 DIAGNOSIS — E785 Hyperlipidemia, unspecified: Secondary | ICD-10-CM | POA: Diagnosis not present

## 2024-06-22 DIAGNOSIS — I252 Old myocardial infarction: Secondary | ICD-10-CM | POA: Diagnosis not present

## 2024-06-22 DIAGNOSIS — Z882 Allergy status to sulfonamides status: Secondary | ICD-10-CM | POA: Diagnosis not present

## 2024-06-22 DIAGNOSIS — Z888 Allergy status to other drugs, medicaments and biological substances status: Secondary | ICD-10-CM | POA: Diagnosis not present

## 2024-06-22 DIAGNOSIS — K219 Gastro-esophageal reflux disease without esophagitis: Secondary | ICD-10-CM | POA: Diagnosis not present

## 2024-06-22 DIAGNOSIS — I2510 Atherosclerotic heart disease of native coronary artery without angina pectoris: Secondary | ICD-10-CM | POA: Diagnosis not present

## 2024-06-22 DIAGNOSIS — Z79899 Other long term (current) drug therapy: Secondary | ICD-10-CM | POA: Diagnosis not present

## 2024-06-22 DIAGNOSIS — Z8673 Personal history of transient ischemic attack (TIA), and cerebral infarction without residual deficits: Secondary | ICD-10-CM | POA: Diagnosis not present

## 2024-06-22 DIAGNOSIS — Z7982 Long term (current) use of aspirin: Secondary | ICD-10-CM | POA: Diagnosis not present

## 2024-06-22 DIAGNOSIS — I509 Heart failure, unspecified: Secondary | ICD-10-CM | POA: Diagnosis not present

## 2024-06-22 DIAGNOSIS — Z91010 Allergy to peanuts: Secondary | ICD-10-CM | POA: Diagnosis not present

## 2024-06-22 DIAGNOSIS — Z7984 Long term (current) use of oral hypoglycemic drugs: Secondary | ICD-10-CM | POA: Diagnosis not present

## 2024-06-22 DIAGNOSIS — Z95810 Presence of automatic (implantable) cardiac defibrillator: Secondary | ICD-10-CM | POA: Diagnosis not present

## 2024-07-05 DIAGNOSIS — E860 Dehydration: Secondary | ICD-10-CM | POA: Diagnosis not present

## 2024-07-05 DIAGNOSIS — J069 Acute upper respiratory infection, unspecified: Secondary | ICD-10-CM | POA: Diagnosis not present

## 2024-07-05 DIAGNOSIS — R197 Diarrhea, unspecified: Secondary | ICD-10-CM | POA: Diagnosis not present

## 2024-07-13 DIAGNOSIS — J390 Retropharyngeal and parapharyngeal abscess: Secondary | ICD-10-CM | POA: Diagnosis not present

## 2024-07-13 DIAGNOSIS — J329 Chronic sinusitis, unspecified: Secondary | ICD-10-CM | POA: Diagnosis not present

## 2024-07-13 DIAGNOSIS — R0600 Dyspnea, unspecified: Secondary | ICD-10-CM | POA: Diagnosis not present

## 2024-07-13 DIAGNOSIS — R051 Acute cough: Secondary | ICD-10-CM | POA: Diagnosis not present

## 2024-08-16 DIAGNOSIS — M5459 Other low back pain: Secondary | ICD-10-CM | POA: Diagnosis not present

## 2024-08-16 DIAGNOSIS — N1831 Chronic kidney disease, stage 3a: Secondary | ICD-10-CM | POA: Diagnosis not present

## 2024-08-16 DIAGNOSIS — K219 Gastro-esophageal reflux disease without esophagitis: Secondary | ICD-10-CM | POA: Diagnosis not present

## 2024-08-16 DIAGNOSIS — K581 Irritable bowel syndrome with constipation: Secondary | ICD-10-CM | POA: Diagnosis not present

## 2024-08-16 DIAGNOSIS — E7841 Elevated Lipoprotein(a): Secondary | ICD-10-CM | POA: Diagnosis not present

## 2024-08-16 DIAGNOSIS — I7389 Other specified peripheral vascular diseases: Secondary | ICD-10-CM | POA: Diagnosis not present

## 2024-08-16 DIAGNOSIS — I5022 Chronic systolic (congestive) heart failure: Secondary | ICD-10-CM | POA: Diagnosis not present

## 2024-08-16 DIAGNOSIS — N1832 Chronic kidney disease, stage 3b: Secondary | ICD-10-CM | POA: Diagnosis not present

## 2024-08-19 DIAGNOSIS — Z95810 Presence of automatic (implantable) cardiac defibrillator: Secondary | ICD-10-CM | POA: Diagnosis not present

## 2024-08-19 DIAGNOSIS — I5022 Chronic systolic (congestive) heart failure: Secondary | ICD-10-CM | POA: Diagnosis not present

## 2024-08-21 DIAGNOSIS — Z95810 Presence of automatic (implantable) cardiac defibrillator: Secondary | ICD-10-CM | POA: Diagnosis not present

## 2024-08-21 DIAGNOSIS — I5022 Chronic systolic (congestive) heart failure: Secondary | ICD-10-CM | POA: Diagnosis not present

## 2024-11-16 NOTE — Progress Notes (Signed)
 Adam Shelton                                          MRN: [id number]   11/16/2024   The [facility] Quality Team Specialist reviewed this patient medical record for the purposes of chart review for care gap closure. The following were reviewed: chart review for care gap closure-kidney health evaluation for diabetes:uACR.    [facility] Quality Team

## 2024-12-13 NOTE — Progress Notes (Signed)
 MICHIO THIER                                          MRN: [id number]   12/13/2024   The [facility] Quality Team Specialist reviewed this patient medical record for the purposes of chart review for care gap closure. The following were reviewed: abstraction for care gap closure-glycemic status assessment.    [facility] Quality Team
